# Patient Record
Sex: Female | Born: 1935
Health system: Southern US, Community
[De-identification: ages and names within clinical notes are randomized; demographics above are authoritative.]

## PROBLEM LIST (undated history)

## (undated) DIAGNOSIS — Z85828 Personal history of other malignant neoplasm of skin: Secondary | ICD-10-CM

## (undated) DIAGNOSIS — Z85820 Personal history of malignant melanoma of skin: Secondary | ICD-10-CM

## (undated) DIAGNOSIS — H3530 Unspecified macular degeneration: Secondary | ICD-10-CM

## (undated) DIAGNOSIS — K449 Diaphragmatic hernia without obstruction or gangrene: Secondary | ICD-10-CM

## (undated) DIAGNOSIS — L309 Dermatitis, unspecified: Secondary | ICD-10-CM

## (undated) DIAGNOSIS — E785 Hyperlipidemia, unspecified: Secondary | ICD-10-CM

## (undated) HISTORY — DX: Personal history of other malignant neoplasm of skin: Z85.828

## (undated) HISTORY — DX: Dermatitis, unspecified: L30.9

## (undated) HISTORY — DX: Unspecified macular degeneration: H35.30

## (undated) HISTORY — DX: Personal history of malignant melanoma of skin: Z85.820

---

## 1962-09-26 HISTORY — PX: BREAST CYST EXCISION: SHX579

## 2015-03-27 HISTORY — PX: CATARACT EXTRACTION: SUR2

## 2015-08-06 DIAGNOSIS — C4361 Malignant melanoma of right upper limb, including shoulder: Secondary | ICD-10-CM | POA: Insufficient documentation

## 2015-08-06 DIAGNOSIS — R5382 Chronic fatigue, unspecified: Secondary | ICD-10-CM | POA: Insufficient documentation

## 2015-08-06 DIAGNOSIS — K219 Gastro-esophageal reflux disease without esophagitis: Secondary | ICD-10-CM | POA: Insufficient documentation

## 2015-08-06 DIAGNOSIS — M7522 Bicipital tendinitis, left shoulder: Secondary | ICD-10-CM

## 2015-08-06 DIAGNOSIS — S46211A Strain of muscle, fascia and tendon of other parts of biceps, right arm, initial encounter: Secondary | ICD-10-CM | POA: Insufficient documentation

## 2015-08-06 DIAGNOSIS — M7521 Bicipital tendinitis, right shoulder: Secondary | ICD-10-CM | POA: Insufficient documentation

## 2015-08-06 DIAGNOSIS — K449 Diaphragmatic hernia without obstruction or gangrene: Secondary | ICD-10-CM | POA: Insufficient documentation

## 2015-08-06 DIAGNOSIS — E782 Mixed hyperlipidemia: Secondary | ICD-10-CM | POA: Insufficient documentation

## 2015-08-06 DIAGNOSIS — H409 Unspecified glaucoma: Secondary | ICD-10-CM | POA: Insufficient documentation

## 2016-06-26 HISTORY — PX: OTHER SURGICAL HISTORY: SHX169

## 2016-06-26 HISTORY — PX: MOHS SURGERY: SHX181

## 2016-11-14 LAB — BASIC METABOLIC PANEL
BUN: 14 (ref 4–21)
CREATININE: 0.8 (ref 0.5–1.1)
GLUCOSE: 93
POTASSIUM: 4.6 (ref 3.4–5.3)
SODIUM: 144 (ref 137–147)

## 2016-11-14 LAB — HEPATIC FUNCTION PANEL
ALT: 8 (ref 7–35)
AST: 19 (ref 13–35)
Alkaline Phosphatase: 63 (ref 25–125)
BILIRUBIN, TOTAL: 1

## 2016-11-14 LAB — LIPID PANEL
Cholesterol: 166 (ref 0–200)
HDL: 43 (ref 35–70)
LDL CALC: 100
Triglycerides: 198 — AB (ref 40–160)

## 2016-11-14 LAB — HEMOGLOBIN A1C: HEMOGLOBIN A1C: 5.8

## 2017-07-12 DIAGNOSIS — Z85820 Personal history of malignant melanoma of skin: Secondary | ICD-10-CM | POA: Diagnosis not present

## 2017-07-12 DIAGNOSIS — Z08 Encounter for follow-up examination after completed treatment for malignant neoplasm: Secondary | ICD-10-CM | POA: Diagnosis not present

## 2017-07-12 DIAGNOSIS — L821 Other seborrheic keratosis: Secondary | ICD-10-CM | POA: Diagnosis not present

## 2017-07-17 ENCOUNTER — Encounter: Payer: Self-pay | Admitting: Family Medicine

## 2017-07-17 ENCOUNTER — Ambulatory Visit (INDEPENDENT_AMBULATORY_CARE_PROVIDER_SITE_OTHER): Payer: Medicare Other

## 2017-07-17 ENCOUNTER — Ambulatory Visit (INDEPENDENT_AMBULATORY_CARE_PROVIDER_SITE_OTHER): Payer: Medicare Other | Admitting: Family Medicine

## 2017-07-17 VITALS — BP 129/68 | HR 88 | Ht 63.0 in | Wt 183.0 lb

## 2017-07-17 DIAGNOSIS — M545 Low back pain, unspecified: Secondary | ICD-10-CM

## 2017-07-17 DIAGNOSIS — H3530 Unspecified macular degeneration: Secondary | ICD-10-CM

## 2017-07-17 DIAGNOSIS — M25512 Pain in left shoulder: Secondary | ICD-10-CM | POA: Diagnosis not present

## 2017-07-17 DIAGNOSIS — R7301 Impaired fasting glucose: Secondary | ICD-10-CM

## 2017-07-17 DIAGNOSIS — N3281 Overactive bladder: Secondary | ICD-10-CM

## 2017-07-17 DIAGNOSIS — M461 Sacroiliitis, not elsewhere classified: Secondary | ICD-10-CM | POA: Diagnosis not present

## 2017-07-17 DIAGNOSIS — E782 Mixed hyperlipidemia: Secondary | ICD-10-CM | POA: Diagnosis not present

## 2017-07-17 DIAGNOSIS — M19012 Primary osteoarthritis, left shoulder: Secondary | ICD-10-CM | POA: Diagnosis not present

## 2017-07-17 MED ORDER — SUCRALFATE 1 G PO TABS: 1.0000 g | ORAL_TABLET | Freq: Two times a day (BID) | ORAL | 1 refills | Status: DC

## 2017-07-17 MED ORDER — MIRABEGRON ER 25 MG PO TB24: 25.0000 mg | ORAL_TABLET | Freq: Every day | ORAL | 3 refills | Status: DC

## 2017-07-17 NOTE — Progress Notes (Signed)
Subjective:    Patient ID: Anna [name], female    DOB: Jun 15, 1936, 81 y.o.   MRN: [id number]  HPI   81 year old female is here today to establish care. She is related to the [place]. She is Anna [name]'s sister.  His lives in the area for about 2 years.  She does complain of left upper arm pain that has been going on for approximately a month may be a little bit longer.  She denies any specific injury or trauma.  She is previously had problems with her right shoulder but this is a little bit different.  Most of her pain is over that upper arm area.  She has been using emu cream, and icy hot.  She states this does help slightly.  She has not been taking any oral medication or any other therapies or treatments.  She has pain particularly when she reaches backwards.  She also complains of low back pain that is bilateral and radiates into the buttock area.  She says it does limit her walking particularly after about 30 minutes she has to sit down rest which does provide some relief.  She denies again any old injuries or trauma or known fractures.  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  She denies any radiation past the buttock area into the legs.  Hyperlipidemia-she wanted to know more about Zetia.  She says that her sister takes it instead of a statin and wondered if that was a better medication than what she was currently taking.  She has been on her statin for several years and is done well without any side effects.  Also recently stopped her aspirin.  Overactive bladder-several years ago she tried an overactive bladder medication she says it was while she was still living in [place].  She does not member the specific name but says that it did not seem to work well.  She gets some frequency and occasionally some urgency but rarely incontinence.  She is able to take ibuprofen.  Anna [name] is requesting refills on her sucralfate today  Review of Systems   Constitutional: Negative for diaphoresis, fever and unexpected weight change.  HENT: Negative for hearing loss, postnasal drip, sneezing and tinnitus.   Eyes: Positive for visual disturbance.  Respiratory: Positive for cough. Negative for wheezing.   Cardiovascular: Negative for chest pain and palpitations.  Genitourinary: Negative for vaginal bleeding and vaginal discharge.  Musculoskeletal: Positive for myalgias. Negative for arthralgias.  Neurological: Negative for headaches.  Hematological: Negative for adenopathy. Bruises/bleeds easily.  Psychiatric/Behavioral: The patient is nervous/anxious.      BP 129/68   Pulse 88   Ht 5\' 3"  (1.6 m)   Wt 183 lb (83 kg)   SpO2 93%   BMI 32.42 kg/m     Allergies  Allergen Reactions  . Aleve [Naproxen Sodium]     Causes elevation in cholesterol   . Azithromycin Rash    Past Medical History:  Diagnosis Date  . Macular degeneration     Past Surgical History:  Procedure Laterality Date  . BREAST CYST EXCISION Right 09/1962  . CATARACT EXTRACTION  03/2015  . melonoma Right 06/2016   arm  . MOHS SURGERY Left 06/2016   nose    Social History   Social History  . Marital status: Single    Spouse name: N/A  . Number of children: N/A  . Years of education: 12+   Occupational History  . 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   Social History Main Topics  . Smoking status: Never Smoker  . Smokeless tobacco: Never Used  . Alcohol use 0.6 oz/week    1 Glasses of wine per week  . Drug use: No  . Sexual activity: No   Other Topics Concern  . Not on file   Social History Narrative   She has a Scientist, water quality in Film/video editor.  He lives alone in a retirement community.  Her sister is Pharmacist, community    Family History  Problem Relation Age of Onset  . Hyperlipidemia Sister   . Macular degeneration Sister   . Cancer - Prostate Brother   . Atrial fibrillation Maternal Aunt      Outpatient Encounter Prescriptions as of 07/17/2017  Medication Sig  . ALPRAZolam (XANAX) 0.25 MG tablet Take 0.25 mg by mouth at bedtime as needed for sleep.  . calcium citrate-vitamin D (CITRACAL+D) 315-200 MG-UNIT tablet Take 1 tablet by mouth 2 (two) times daily.  . Flaxseed Oil OIL 1,000 mg.  . latanoprost (XALATAN) 0.005 % ophthalmic solution Place 1 drop into both eyes daily.  Marland Kitchen lovastatin (MEVACOR) 40 MG tablet Take 40 mg by mouth.  . Lysine 500 MG CAPS Take 1 capsule by mouth daily.  . Multiple Vitamins-Minerals (PRESERVISION AREDS 2) CAPS Take by mouth.  Marland Kitchen omeprazole (PRILOSEC) 40 MG capsule Take 40 mg by mouth.  . sucralfate (CARAFATE) 1 g tablet Take 1 tablet (1 g total) by mouth 2 (two) times daily.  . timolol (BETIMOL) 0.5 % ophthalmic solution Apply 1 drop to eye 2 (two) times daily.  . TURMERIC PO Take 1,000 mg by mouth.  Marland Kitchen UNABLE TO FIND Right eye injection every 5-6 weeks for Macular Degeneration  . vitamin B-12 (CYANOCOBALAMIN) 1000 MCG tablet Take 1,000 mcg by mouth daily.   . vitamin E 400 UNIT capsule Take 1 capsule by mouth daily.  . [DISCONTINUED] lovastatin (MEVACOR) 10 MG tablet Take 10 mg by mouth at bedtime.  . [DISCONTINUED] sucralfate (CARAFATE) 1 g tablet Take 1 g by mouth 4 (four) times daily.  . mirabegron ER (MYRBETRIQ) 25 MG TB24 tablet Take 1 tablet (25 mg total) by mouth daily.  . [DISCONTINUED] aspirin 81 MG tablet Take 81 mg by mouth daily.  . [DISCONTINUED] omeprazole (PRILOSEC) 10 MG capsule Take 10 mg by mouth daily.   No facility-administered encounter medications on file as of 07/17/2017.          Objective:   Physical Exam  Constitutional: She is oriented to person, place, and time. She appears well-developed and well-nourished.  HENT:  Head: Normocephalic and atraumatic.  Right Ear: External ear normal.  Left Ear: External ear normal.  Nose: Nose normal.  Eyes: Conjunctivae are normal.  Cardiovascular: Normal rate, regular  rhythm and normal heart sounds.   Pulmonary/Chest: Effort normal and breath sounds normal.  Musculoskeletal:  Neck with normal flexion, extension, rotation right and left and side bending.  Left shoulder with fairly normal range of motion.  Nontender over the shoulder joint itself and nontender over the upper arm.  Positive empty can test with significant discomfort and pain on the left side.  Strength with liftoff test is actually normal.  Strength is 5 out of 5 with internal and external rotation.  Full range of motion.  Nontender over the lumbar spine itself but she is tender over both SI joints.  Normal flexion-extension of the lumbar spine.  Negative straight  leg raise bilaterally.  Hip, knee, ankle strength is 5 out of 5 with patellar reflexes 1+ bilaterally.  Neurological: She is alert and oriented to person, place, and time.  Skin: Skin is warm and dry.  Psychiatric: She has a normal mood and affect. Her behavior is normal.          Assessment & Plan:  GERD- RF sucralfate.    Hyperlipidemia- due to recheck lipids.  Just that a statin has much more data in reduction of risk for heart attack and stroke than Zetia.  Recommend that she stay on her current regimen.  IFG - due for A1C last tone was 6.0. Will monitor this months.  Left shoulder pain-as her pain is been going on for little over a month at this point time will go ahead and get x-ray for further evaluation.  They likely will show some arthritis.  I think that she has some rotator cuff injury at this point and given exercises and stretches to do on her own at home as well as recommendation for ibuprofen 6 oh milligrams 3 times daily times 5 days and then as needed.  Again if she is not improving and like to get her in with 1 of our sports med doctor for further evaluation.  Low back pain -she is actually very tender over the SI joints bilaterally.  We will start with some home stretches and exercises at first.  And since she is  able to take Aleve we will start with 600 mg 3 times a day for 5 days and then wean and taper and take as needed.  If she is not improving at that point then recommend x-rays for further evaluation.  Overactive bladder-recommend a trial of Myrbetriq.  If this is not relieving her symptoms and I think she needs to see urology for further consultation.  Degeneration/glaucoma-follows with her eye doctor regularly.

## 2017-07-17 NOTE — Patient Instructions (Addendum)
Recommend 60 mg of ibuprofen 3 x a day for the next 5 days. After that okay to just take as need.  If your shoulder and back are not improving in about 3 weeks then I would like to get you in with one of our sports med docs here in our office.  They take care of musculoskeletal problems.

## 2017-07-20 ENCOUNTER — Encounter: Payer: Self-pay | Admitting: Family Medicine

## 2017-07-20 DIAGNOSIS — H353211 Exudative age-related macular degeneration, right eye, with active choroidal neovascularization: Secondary | ICD-10-CM | POA: Diagnosis not present

## 2017-07-20 DIAGNOSIS — Z85820 Personal history of malignant melanoma of skin: Secondary | ICD-10-CM

## 2017-07-20 DIAGNOSIS — Z85828 Personal history of other malignant neoplasm of skin: Secondary | ICD-10-CM

## 2017-07-20 HISTORY — DX: Personal history of other malignant neoplasm of skin: Z85.828

## 2017-07-20 HISTORY — DX: Personal history of malignant melanoma of skin: Z85.820

## 2017-07-24 DIAGNOSIS — E782 Mixed hyperlipidemia: Secondary | ICD-10-CM | POA: Diagnosis not present

## 2017-07-24 DIAGNOSIS — R7301 Impaired fasting glucose: Secondary | ICD-10-CM | POA: Diagnosis not present

## 2017-07-25 LAB — COMPLETE METABOLIC PANEL WITH GFR
AG Ratio: 2 (calc) (ref 1.0–2.5)
ALBUMIN MSPROF: 4.2 g/dL (ref 3.6–5.1)
ALKALINE PHOSPHATASE (APISO): 60 U/L (ref 33–130)
ALT: 7 U/L (ref 6–29)
AST: 20 U/L (ref 10–35)
BILIRUBIN TOTAL: 0.9 mg/dL (ref 0.2–1.2)
BUN: 10 mg/dL (ref 7–25)
CHLORIDE: 104 mmol/L (ref 98–110)
CO2: 29 mmol/L (ref 20–32)
Calcium: 9 mg/dL (ref 8.6–10.4)
Creat: 0.85 mg/dL (ref 0.60–0.93)
GFR, Est African American: 76 mL/min/{1.73_m2} (ref >=60)
GFR, Est Non African American: 65 mL/min/{1.73_m2} (ref >=60)
Globulin: 2.1 g/dL (calc) (ref 1.9–3.7)
Glucose, Bld: 95 mg/dL (ref 65–99)
Potassium: 4.3 mmol/L (ref 3.5–5.3)
Sodium: 141 mmol/L (ref 135–146)
Total Protein: 6.3 g/dL (ref 6.1–8.1)

## 2017-07-25 LAB — LIPID PANEL W/REFLEX DIRECT LDL
CHOLESTEROL: 176 mg/dL (ref <200)
HDL: 45 mg/dL — ABNORMAL LOW (ref >50)
LDL Cholesterol (Calc): 96 mg/dL (calc)
NON-HDL CHOLESTEROL (CALC): 131 mg/dL — AB (ref <130)
Total CHOL/HDL Ratio: 3.9 (calc) (ref <5.0)
Triglycerides: 234 mg/dL — ABNORMAL HIGH (ref <150)

## 2017-07-25 LAB — HEMOGLOBIN A1C
Hgb A1c MFr Bld: 5.8 % of total Hgb — ABNORMAL HIGH (ref <5.7)
Mean Plasma Glucose: 120 (calc)
eAG (mmol/L): 6.6 (calc)

## 2017-08-07 DIAGNOSIS — H401131 Primary open-angle glaucoma, bilateral, mild stage: Secondary | ICD-10-CM | POA: Diagnosis not present

## 2017-08-07 DIAGNOSIS — H353122 Nonexudative age-related macular degeneration, left eye, intermediate dry stage: Secondary | ICD-10-CM | POA: Diagnosis not present

## 2017-08-07 DIAGNOSIS — H26493 Other secondary cataract, bilateral: Secondary | ICD-10-CM | POA: Diagnosis not present

## 2017-08-07 DIAGNOSIS — H353211 Exudative age-related macular degeneration, right eye, with active choroidal neovascularization: Secondary | ICD-10-CM | POA: Diagnosis not present

## 2017-08-07 DIAGNOSIS — D3132 Benign neoplasm of left choroid: Secondary | ICD-10-CM | POA: Diagnosis not present

## 2017-08-07 DIAGNOSIS — H527 Unspecified disorder of refraction: Secondary | ICD-10-CM | POA: Diagnosis not present

## 2017-08-10 ENCOUNTER — Telehealth: Payer: Self-pay

## 2017-08-10 NOTE — Telephone Encounter (Signed)
Patient would like a refill on Omeprazole. Historical provider.

## 2017-08-10 NOTE — Telephone Encounter (Signed)
OK to fill

## 2017-08-11 MED ORDER — OMEPRAZOLE 40 MG PO CPDR: 40.0000 mg | DELAYED_RELEASE_CAPSULE | Freq: Every day | ORAL | 3 refills | Status: DC

## 2017-08-11 NOTE — Telephone Encounter (Signed)
Rx sent Pt advised 

## 2017-09-08 ENCOUNTER — Encounter: Payer: Self-pay | Admitting: Family Medicine

## 2017-10-04 ENCOUNTER — Emergency Department
Admission: EM | Admit: 2017-10-04 | Discharge: 2017-10-04 | Disposition: A | Discharge status: Home / Self Care | Payer: Medicare Other | Source: Home / Self Care | Attending: Family Medicine | Admitting: Family Medicine

## 2017-10-04 ENCOUNTER — Other Ambulatory Visit: Payer: Self-pay

## 2017-10-04 DIAGNOSIS — J069 Acute upper respiratory infection, unspecified: Secondary | ICD-10-CM | POA: Diagnosis not present

## 2017-10-04 DIAGNOSIS — B9789 Other viral agents as the cause of diseases classified elsewhere: Secondary | ICD-10-CM

## 2017-10-04 HISTORY — DX: Diaphragmatic hernia without obstruction or gangrene: K44.9

## 2017-10-04 HISTORY — DX: Hyperlipidemia, unspecified: E78.5

## 2017-10-04 MED ORDER — ALBUTEROL SULFATE HFA 108 (90 BASE) MCG/ACT IN AERS: 1.0000 | INHALATION_SPRAY | Freq: Four times a day (QID) | RESPIRATORY_TRACT | 0 refills | Status: DC | PRN

## 2017-10-04 MED ORDER — IPRATROPIUM BROMIDE 0.06 % NA SOLN: 2.0000 | Freq: Four times a day (QID) | NASAL | 1 refills | Status: DC

## 2017-10-04 MED ORDER — AMOXICILLIN 500 MG PO CAPS
500.0000 mg | ORAL_CAPSULE | Freq: Three times a day (TID) | ORAL | 0 refills | Status: DC
Start: 2017-10-04 — End: 2017-11-30

## 2017-10-04 MED ORDER — BENZONATATE 100 MG PO CAPS: 100.0000 mg | ORAL_CAPSULE | Freq: Three times a day (TID) | ORAL | 0 refills | Status: DC

## 2017-10-04 NOTE — ED Triage Notes (Signed)
Pt c/o cough with yellow drainage, sneezing with clear nasal drainage, chest congestion with wheezing, sore throat, onset x 4 days.  Denies ear pain and fever.  Has Mucinex with last dose at 11pm last night.

## 2017-10-04 NOTE — Discharge Instructions (Signed)
°  Your symptoms are likely due to a virus such as the common cold, however, if you developing worsening chest congestion with shortness of breath, persistent fever (>100.4*F) for 3 days, or symptoms not improving in 4-5 days, you may fill the antibiotic (amoxicillin).  If you do fill the antibiotic,  please take antibiotics as prescribed and be sure to complete entire course even if you start to feel better to ensure infection does not come back.

## 2017-10-04 NOTE — ED Provider Notes (Signed)
[facility]    CSN: [id number] Arrival date & time: 10/04/17  1145     History   Chief Complaint Chief Complaint  Patient presents with  . URI    HPI Anna [name] is a 82 y.o. female.   HPI  Anna [name] is a 82 y.o. female presenting to UC with c/o cough with yellow sputum, chest congestion, wheeze, sneezing with clear nasal discharge, and sore throat for about 4 days.  She has tried Mucinex with last dose being 11pm last night. Denies fever, chills, n/v/d. No hx of asthma but she has had bronchitis in the past. Pt concerned she has bronchitis again. Denies sick contacts or recent travel.   Past Medical History:  Diagnosis Date  . Hiatal hernia   . History of basal cell carcinoma 07/20/2017   On nose.   Marland Kitchen History of melanoma 07/20/2017   Right upper arm   . Hyperlipidemia   . Macular degeneration     Patient Active Problem List   Diagnosis Date Noted  . History of melanoma 07/20/2017  . History of basal cell carcinoma 07/20/2017  . IFG (impaired fasting glucose) 07/17/2017  . Macular degeneration   . Chronic fatigue 08/06/2015  . Gastroesophageal reflux disease without esophagitis 08/06/2015  . Glaucoma 08/06/2015  . Hiatal hernia 08/06/2015  . Melanoma of right upper arm ([place]) 08/06/2015  . Mixed hyperlipidemia 08/06/2015  . Rupture of right biceps tendon 08/06/2015  . Biceps tendonitis of both shoulders 08/06/2015    Past Surgical History:  Procedure Laterality Date  . BREAST CYST EXCISION Right 09/1962  . CATARACT EXTRACTION  03/2015  . melonoma Right 06/2016   arm  . MOHS SURGERY Left 06/2016   nose    OB History    No data available       Home Medications    Prior to Admission medications   Medication Sig Start Date End Date Taking? Authorizing Provider  albuterol (PROVENTIL HFA;VENTOLIN HFA) 108 (90 Base) MCG/ACT inhaler Inhale 1-2 puffs into the lungs every 6 (six) hours as needed for wheezing or shortness of breath. 10/04/17    Noe Gens, PA-C  ALPRAZolam Duanne Moron) 0.25 MG tablet Take 0.25 mg by mouth at bedtime as needed for sleep. 11/14/16   [provider]  amoxicillin (AMOXIL) 500 MG capsule Take 1 capsule (500 mg total) by mouth 3 (three) times daily. 10/04/17   Noe Gens, PA-C  benzonatate (TESSALON) 100 MG capsule Take 1-2 capsules (100-200 mg total) by mouth every 8 (eight) hours. 10/04/17   Noe Gens, PA-C  calcium citrate-vitamin D (CITRACAL+D) 315-200 MG-UNIT tablet Take 1 tablet by mouth 2 (two) times daily.    [provider]  Flaxseed Oil OIL 1,000 mg.    [provider]  ipratropium (ATROVENT) 0.06 % nasal spray Place 2 sprays into both nostrils 4 (four) times daily. 10/04/17   Noe Gens, PA-C  latanoprost (XALATAN) 0.005 % ophthalmic solution Place 1 drop into both eyes daily. 05/02/16   [provider]  lovastatin (MEVACOR) 40 MG tablet Take 40 mg by mouth. 11/14/16   [provider]  Lysine 500 MG CAPS Take 1 capsule by mouth daily.    [provider]  mirabegron ER (MYRBETRIQ) 25 MG TB24 tablet Take 1 tablet (25 mg total) by mouth daily. 07/17/17   Hali Marry, MD  Multiple Vitamins-Minerals (PRESERVISION AREDS 2) CAPS Take by mouth.    [provider]  omeprazole (PRILOSEC) 40 MG  capsule Take 1 capsule (40 mg total) daily by mouth. 08/11/17   Hali Marry, MD  sucralfate (CARAFATE) 1 g tablet Take 1 tablet (1 g total) by mouth 2 (two) times daily. 07/17/17   Hali Marry, MD  timolol (BETIMOL) 0.5 % ophthalmic solution Apply 1 drop to eye 2 (two) times daily.    [provider]  TURMERIC PO Take 1,000 mg by mouth.    [provider]  UNABLE TO FIND Right eye injection every 5-6 weeks for Macular Degeneration    [provider]  vitamin B-12 (CYANOCOBALAMIN) 1000 MCG tablet Take 1,000 mcg by mouth daily.     [provider]  vitamin E 400 UNIT capsule Take 1 capsule by  mouth daily.    [provider]    Family History Family History  Problem Relation Age of Onset  . Hyperlipidemia Sister   . Macular degeneration Sister   . Cancer - Prostate Brother   . Atrial fibrillation Maternal Aunt   . Cancer Mother        pancreatic  . Cancer Father        liver    Social History Social History   Tobacco Use  . Smoking status: Never Smoker  . Smokeless tobacco: Never Used  Substance Use Topics  . Alcohol use: Yes    Alcohol/week: 0.6 oz    Types: 1 Glasses of wine per week  . Drug use: No     Allergies   Aleve [naproxen sodium] and Azithromycin   Review of Systems Review of Systems  Constitutional: Negative for chills and fever.  HENT: Positive for congestion, postnasal drip, rhinorrhea, sinus pressure, sneezing and sore throat. Negative for ear pain, sinus pain, trouble swallowing and voice change.   Respiratory: Positive for cough and wheezing. Negative for shortness of breath.   Cardiovascular: Negative for chest pain and palpitations.  Gastrointestinal: Negative for abdominal pain, diarrhea, nausea and vomiting.  Musculoskeletal: Negative for arthralgias, back pain and myalgias.  Skin: Negative for rash.  Neurological: Negative for dizziness, syncope, weakness, light-headedness and headaches.     Physical Exam Triage Vital Signs ED Triage Vitals [10/04/17 1204]  Enc Vitals Group     BP 136/78     Pulse Rate 93     Resp      Temp 98.2 F (36.8 C)     Temp Source Oral     SpO2 94 %     Weight 185 lb 8 oz (84.1 kg)     Height 5\' 3"  (1.6 m)     Head Circumference      Peak Flow      Pain Score      Pain Loc      Pain Edu?      Excl. in [place]?    No data found.  Updated Vital Signs BP 136/78 (BP Location: Right Arm)   Pulse 93   Temp 98.2 F (36.8 C) (Oral)   Ht 5\' 3"  (1.6 m)   Wt 185 lb 8 oz (84.1 kg)   SpO2 94%   BMI 32.86 kg/m   Visual Acuity Right Eye Distance:   Left Eye Distance:   Bilateral  Distance:    Right Eye Near:   Left Eye Near:    Bilateral Near:     Physical Exam  Constitutional: She is oriented to person, place, and time. She appears well-developed and well-nourished. No distress.  HENT:  Head: Normocephalic and atraumatic.  Right Ear:  Tympanic membrane normal.  Left Ear: Tympanic membrane normal.  Nose: Mucosal edema present. Right sinus exhibits no maxillary sinus tenderness and no frontal sinus tenderness. Left sinus exhibits no maxillary sinus tenderness and no frontal sinus tenderness.  Mouth/Throat: Uvula is midline, oropharynx is clear and moist and mucous membranes are normal.  Eyes: EOM are normal.  Neck: Normal range of motion. Neck supple.  Cardiovascular: Normal rate and regular rhythm.  Pulmonary/Chest: Effort normal and breath sounds normal. No stridor. No respiratory distress. She has no wheezes. She has no rales.  Musculoskeletal: Normal range of motion.  Neurological: She is alert and oriented to person, place, and time.  Skin: Skin is warm and dry. She is not diaphoretic.  Psychiatric: She has a normal mood and affect. Her behavior is normal.  Nursing note and vitals reviewed.    UC Treatments / Results  Labs (all labs ordered are listed, but only abnormal results are displayed) Labs Reviewed - No data to display  EKG  EKG Interpretation None       Radiology No results found.  Procedures Procedures (including critical care time)  Medications Ordered in UC Medications - No data to display   Initial Impression / Assessment and Plan / UC Course  I have reviewed the triage vital signs and the nursing notes.  Pertinent labs & imaging results that were available during my care of the patient were reviewed by me and considered in my medical decision making (see chart for details).     Exam c/w viral illness Encouraged pt try symptomatic treatment at this time. Prescription to hold with expiration date for amoxicillin. Pt to  fill if persistent fever develops, worsening chest tightness, or not improving in 1 week.    Final Clinical Impressions(s) / UC Diagnoses   Final diagnoses:  Viral URI with cough    ED Discharge Orders        Ordered    ipratropium (ATROVENT) 0.06 % nasal spray  4 times daily     10/04/17 1220    benzonatate (TESSALON) 100 MG capsule  Every 8 hours     10/04/17 1220    albuterol (PROVENTIL HFA;VENTOLIN HFA) 108 (90 Base) MCG/ACT inhaler  Every 6 hours PRN     10/04/17 1220    amoxicillin (AMOXIL) 500 MG capsule  3 times daily    Comments:  Void after 10/20/17   10/04/17 1220       Controlled Substance Prescriptions  Controlled Substance Registry consulted? Not Applicable   Tyrell Antonio 10/04/17 1324

## 2017-10-19 DIAGNOSIS — H353211 Exudative age-related macular degeneration, right eye, with active choroidal neovascularization: Secondary | ICD-10-CM | POA: Diagnosis not present

## 2017-11-30 ENCOUNTER — Ambulatory Visit (INDEPENDENT_AMBULATORY_CARE_PROVIDER_SITE_OTHER): Payer: Medicare Other | Admitting: Family Medicine

## 2017-11-30 ENCOUNTER — Encounter: Payer: Self-pay | Admitting: Family Medicine

## 2017-11-30 VITALS — BP 136/74 | HR 74 | Ht 63.0 in | Wt 184.0 lb

## 2017-11-30 DIAGNOSIS — K429 Umbilical hernia without obstruction or gangrene: Secondary | ICD-10-CM | POA: Diagnosis not present

## 2017-11-30 NOTE — Patient Instructions (Addendum)

## 2017-11-30 NOTE — Progress Notes (Signed)
Subjective:    Patient ID: Anna [name], female    DOB: 19-Oct-1935, 82 y.o.   MRN: [id number]  HPI pt reports that she noticed that her belly button looked very different over the last 41mos ago. she said that it is not painful. she believes that it is from all of the lifting that she has been doing. she stated that she has moved 2x over the past 3 years.  She thinks that the hernia and says she gets a little nervous if she has a strain with bowel movements.  She does have a history of hiatal hernia.   Review of Systems  BP 136/74   Pulse 74   Ht 5\' 3"  (1.6 m)   Wt 184 lb (83.5 kg)   SpO2 98%   BMI 32.59 kg/m     Allergies  Allergen Reactions  . Aleve [Naproxen Sodium]     Causes elevation in cholesterol   . Azithromycin Rash    Past Medical History:  Diagnosis Date  . Hiatal hernia   . History of basal cell carcinoma 07/20/2017   On nose.   Marland Kitchen History of melanoma 07/20/2017   Right upper arm   . Hyperlipidemia   . Macular degeneration     Past Surgical History:  Procedure Laterality Date  . BREAST CYST EXCISION Right 09/1962  . CATARACT EXTRACTION  03/2015  . melonoma Right 06/2016   arm  . MOHS SURGERY Left 06/2016   nose    Social History   Socioeconomic History  . Marital status: Single    Spouse name: Not on file  . Number of children: Not on file  . Years of education: 12+  . Highest education level: Not on file  Social Needs  . Financial resource strain: Not on file  . Food insecurity - worry: Not on file  . Food insecurity - inability: Not on file  . Transportation needs - medical: Not on file  . Transportation needs - non-medical: Not on file  Occupational History  . 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  Tobacco Use  . Smoking status: Never Smoker  . Smokeless tobacco: Never Used  Substance and Sexual Activity  . Alcohol use: Yes    Alcohol/week: 0.6 oz    Types: 1 Glasses  of wine per week  . Drug use: No  . Sexual activity: No  Other Topics Concern  . Not on file  Social History Narrative   She has a Scientist, water quality in Film/video editor.  He lives alone in a retirement community.  Her sister is Pharmacist, community    Family History  Problem Relation Age of Onset  . Hyperlipidemia Sister   . Macular degeneration Sister   . Cancer - Prostate Brother   . Atrial fibrillation Maternal Aunt   . Cancer Mother        pancreatic  . Cancer Father        liver    Outpatient Encounter Medications as of 11/30/2017  Medication Sig  . ALPRAZolam (XANAX) 0.25 MG tablet Take 0.25 mg by mouth at bedtime as needed for sleep.  . calcium citrate-vitamin D (CITRACAL+D) 315-200 MG-UNIT tablet Take 1 tablet by mouth 2 (two) times daily.  . Flaxseed Oil OIL 1,000 mg.  . latanoprost (XALATAN) 0.005 % ophthalmic solution Place 1 drop into both eyes daily.  Marland Kitchen lovastatin (MEVACOR) 40 MG tablet Take 40 mg by mouth.  . Lysine  500 MG CAPS Take 1 capsule by mouth daily.  . mirabegron ER (MYRBETRIQ) 25 MG TB24 tablet Take 1 tablet (25 mg total) by mouth daily.  . Multiple Vitamins-Minerals (PRESERVISION AREDS 2) CAPS Take by mouth.  Marland Kitchen omeprazole (PRILOSEC) 40 MG capsule Take 1 capsule (40 mg total) daily by mouth.  . sucralfate (CARAFATE) 1 g tablet Take 1 tablet (1 g total) by mouth 2 (two) times daily.  . timolol (BETIMOL) 0.5 % ophthalmic solution Apply 1 drop to eye 2 (two) times daily.  . TURMERIC PO Take 1,000 mg by mouth.  Marland Kitchen UNABLE TO FIND Right eye injection every 5-6 weeks for Macular Degeneration  . vitamin B-12 (CYANOCOBALAMIN) 1000 MCG tablet Take 1,000 mcg by mouth daily.   . vitamin E 400 UNIT capsule Take 1 capsule by mouth daily.  . [DISCONTINUED] albuterol (PROVENTIL HFA;VENTOLIN HFA) 108 (90 Base) MCG/ACT inhaler Inhale 1-2 puffs into the lungs every 6 (six) hours as needed for wheezing or shortness of breath.  . [DISCONTINUED] amoxicillin (AMOXIL) 500 MG capsule Take 1  capsule (500 mg total) by mouth 3 (three) times daily.  . [DISCONTINUED] benzonatate (TESSALON) 100 MG capsule Take 1-2 capsules (100-200 mg total) by mouth every 8 (eight) hours.  . [DISCONTINUED] ipratropium (ATROVENT) 0.06 % nasal spray Place 2 sprays into both nostrils 4 (four) times daily.   No facility-administered encounter medications on file as of 11/30/2017.          Objective:   Physical Exam  Constitutional: She is oriented to person, place, and time. She appears well-developed and well-nourished.  HENT:  Head: Normocephalic and atraumatic.  Eyes: Conjunctivae and EOM are normal.  Cardiovascular: Normal rate.  Pulmonary/Chest: Effort normal.  Abdominal: Soft. She exhibits no distension. There is no tenderness. There is no rebound.  She definitely has a umbilical hernia with a defect in the wall.  It is very soft and nontender on exam.  No surrounding erythema.  I did not palpate any defects around the umbilicus.  Neurological: She is alert and oriented to person, place, and time.  Skin: Skin is dry. No pallor.  Psychiatric: She has a normal mood and affect. Her behavior is normal.  Vitals reviewed.         Assessment & Plan:  Umbilical hernia- -discussed diagnosis.  Discussed warning symptoms and signs for when to go to the emergency department.  At this point I recommend consultation with surgery to discuss possible repair.  There are some people who choose to just leave it especially if is not causing any pain or problem.  But some people do opt to have it repaired she.  She is extremely healthy so she opts to have it repaired she will likely do well with surgery.  Additional handout provided.

## 2017-12-15 DIAGNOSIS — K429 Umbilical hernia without obstruction or gangrene: Secondary | ICD-10-CM | POA: Diagnosis not present

## 2018-01-04 DIAGNOSIS — H353211 Exudative age-related macular degeneration, right eye, with active choroidal neovascularization: Secondary | ICD-10-CM | POA: Diagnosis not present

## 2018-01-08 DIAGNOSIS — L821 Other seborrheic keratosis: Secondary | ICD-10-CM | POA: Diagnosis not present

## 2018-01-08 DIAGNOSIS — L820 Inflamed seborrheic keratosis: Secondary | ICD-10-CM | POA: Diagnosis not present

## 2018-01-08 DIAGNOSIS — Z85828 Personal history of other malignant neoplasm of skin: Secondary | ICD-10-CM | POA: Diagnosis not present

## 2018-01-08 DIAGNOSIS — Z85820 Personal history of malignant melanoma of skin: Secondary | ICD-10-CM | POA: Diagnosis not present

## 2018-02-12 DIAGNOSIS — D3132 Benign neoplasm of left choroid: Secondary | ICD-10-CM | POA: Diagnosis not present

## 2018-02-12 DIAGNOSIS — H353211 Exudative age-related macular degeneration, right eye, with active choroidal neovascularization: Secondary | ICD-10-CM | POA: Diagnosis not present

## 2018-02-12 DIAGNOSIS — H26493 Other secondary cataract, bilateral: Secondary | ICD-10-CM | POA: Diagnosis not present

## 2018-02-12 DIAGNOSIS — H401131 Primary open-angle glaucoma, bilateral, mild stage: Secondary | ICD-10-CM | POA: Diagnosis not present

## 2018-02-12 DIAGNOSIS — H353122 Nonexudative age-related macular degeneration, left eye, intermediate dry stage: Secondary | ICD-10-CM | POA: Diagnosis not present

## 2018-02-27 ENCOUNTER — Other Ambulatory Visit: Payer: Self-pay | Admitting: Family Medicine

## 2018-03-07 ENCOUNTER — Other Ambulatory Visit: Payer: Self-pay | Admitting: Family Medicine

## 2018-03-08 DIAGNOSIS — H353211 Exudative age-related macular degeneration, right eye, with active choroidal neovascularization: Secondary | ICD-10-CM | POA: Diagnosis not present

## 2018-03-18 ENCOUNTER — Other Ambulatory Visit: Payer: Self-pay | Admitting: Family Medicine

## 2018-05-03 DIAGNOSIS — H353211 Exudative age-related macular degeneration, right eye, with active choroidal neovascularization: Secondary | ICD-10-CM | POA: Diagnosis not present

## 2018-06-14 ENCOUNTER — Encounter: Payer: Self-pay | Admitting: Family Medicine

## 2018-07-04 ENCOUNTER — Emergency Department
Admission: EM | Admit: 2018-07-04 | Discharge: 2018-07-04 | Disposition: A | Discharge status: Home / Self Care | Payer: Medicare Other | Source: Home / Self Care | Attending: Family Medicine | Admitting: Family Medicine

## 2018-07-04 ENCOUNTER — Encounter: Payer: Self-pay | Admitting: *Deleted

## 2018-07-04 ENCOUNTER — Emergency Department (INDEPENDENT_AMBULATORY_CARE_PROVIDER_SITE_OTHER): Payer: Medicare Other

## 2018-07-04 ENCOUNTER — Other Ambulatory Visit: Payer: Self-pay

## 2018-07-04 DIAGNOSIS — R42 Dizziness and giddiness: Secondary | ICD-10-CM | POA: Diagnosis not present

## 2018-07-04 DIAGNOSIS — H3530 Unspecified macular degeneration: Secondary | ICD-10-CM

## 2018-07-04 MED ORDER — MECLIZINE HCL 25 MG PO TABS: 25.0000 mg | ORAL_TABLET | Freq: Three times a day (TID) | ORAL | 0 refills | Status: DC | PRN

## 2018-07-04 NOTE — ED Triage Notes (Signed)
Pt c/o intermittent dizzy spells every 2 weeks for a month. She had one last night called EMS because she became flush and red. She took Dramamine while they were in route and her symptoms resolved. BP with EMS was 152/92 and remained the same when she stood up. She refused EMS because she was feeling better. She is here today for a f/u. She reports having Vertigo many years ago and had relief with Meclizine.

## 2018-07-04 NOTE — ED Provider Notes (Signed)
[facility]    CSN: [id number] Arrival date & time: 07/04/18  1113     History   Chief Complaint Chief Complaint  Patient presents with  . Dizziness    HPI Anna [name] is a 82 y.o. female.   HPI Anna [name] is a 82 y.o. female presenting to UC with c/o intermittent dizzy spells over the last 1 month but worse the last 2 weeks. Symptoms initially started with what felt like a "whack" on the Left posterior aspect of her head. Pain was 10/10 throbbing but only lasted a few seconds.  She has since had 3 episodes of dizziness. First episode lasted 2 days, second one 3 days. And one last night lasted about 2 hours but improved after taking OTC Dramamine.  She did get concerned last night because she felt flush and her face turned red.  EMS was called but determined her vitals were good, she was not orthostatic and dizziness resolved due to taking Dramamine. Pt declined transport to the hospital.  She did have Vertigo many years ago and would like to try Meclizine for current dizzy spells because it helped in the past. Denies recent illness of cough or congestion. Denies HA at this time. Denies chest pain or palpitations. No nausea, weakness or numbness.    Past Medical History:  Diagnosis Date  . Hiatal hernia   . History of basal cell carcinoma 07/20/2017   On nose.   Marland Kitchen History of melanoma 07/20/2017   Right upper arm   . Hyperlipidemia   . Macular degeneration     Patient Active Problem List   Diagnosis Date Noted  . History of melanoma 07/20/2017  . History of basal cell carcinoma 07/20/2017  . IFG (impaired fasting glucose) 07/17/2017  . Macular degeneration   . Chronic fatigue 08/06/2015  . Gastroesophageal reflux disease without esophagitis 08/06/2015  . Glaucoma 08/06/2015  . Hiatal hernia 08/06/2015  . Melanoma of right upper arm ([place]) 08/06/2015  . Mixed hyperlipidemia 08/06/2015  . Rupture of right biceps tendon 08/06/2015  . Biceps tendonitis of  both shoulders 08/06/2015    Past Surgical History:  Procedure Laterality Date  . BREAST CYST EXCISION Right 09/1962  . CATARACT EXTRACTION  03/2015  . melonoma Right 06/2016   arm  . MOHS SURGERY Left 06/2016   nose    OB History   None      Home Medications    Prior to Admission medications   Medication Sig Start Date End Date Taking? Authorizing Provider  ALPRAZolam Duanne Moron) 0.25 MG tablet Take 0.25 mg by mouth at bedtime as needed for sleep. 11/14/16   [provider]  calcium citrate-vitamin D (CITRACAL+D) 315-200 MG-UNIT tablet Take 1 tablet by mouth 2 (two) times daily.    [provider]  Flaxseed Oil OIL 1,000 mg.    [provider]  latanoprost (XALATAN) 0.005 % ophthalmic solution Place 1 drop into both eyes daily. 05/02/16   [provider]  lovastatin (MEVACOR) 40 MG tablet TAKE ONE TABLET BY MOUTH NIGHTLY 02/27/18   Hali Marry, MD  Lysine 500 MG CAPS Take 1 capsule by mouth daily.    [provider]  meclizine (ANTIVERT) 25 MG tablet Take 1 tablet (25 mg total) by mouth 3 (three) times daily as needed for dizziness. 07/04/18   Noe Gens, PA-C  Multiple Vitamins-Minerals (PRESERVISION AREDS 2) CAPS Take by mouth.    [provider]  MYRBETRIQ 25 MG TB24 tablet Take 1  tablet (25 mg total) by mouth daily. 03/07/18   Hali Marry, MD  omeprazole (PRILOSEC) 40 MG capsule Take 1 capsule (40 mg total) daily by mouth. 08/11/17   Hali Marry, MD  sucralfate (CARAFATE) 1 g tablet TAKE 1 TABLET BY MOUTH TWICE DAILY 03/19/18   Hali Marry, MD  timolol (BETIMOL) 0.5 % ophthalmic solution Apply 1 drop to eye 2 (two) times daily.    [provider]  TURMERIC PO Take 1,000 mg by mouth.    [provider]  UNABLE TO FIND Right eye injection every 5-6 weeks for Macular Degeneration    [provider]  vitamin B-12 (CYANOCOBALAMIN) 1000 MCG tablet Take 1,000 mcg by  mouth daily.     [provider]  vitamin E 400 UNIT capsule Take 1 capsule by mouth daily.    [provider]    Family History Family History  Problem Relation Age of Onset  . Hyperlipidemia Sister   . Macular degeneration Sister   . Cancer - Prostate Brother   . Atrial fibrillation Maternal Aunt   . Cancer Mother        pancreatic  . Cancer Father        liver    Social History Social History   Tobacco Use  . Smoking status: Never Smoker  . Smokeless tobacco: Never Used  Substance Use Topics  . Alcohol use: Yes    Alcohol/week: 1.0 standard drinks    Types: 1 Glasses of wine per week  . Drug use: No     Allergies   Aleve [naproxen sodium] and Azithromycin   Review of Systems Review of Systems  Constitutional: Negative for chills and fever.  HENT: Negative for congestion, ear pain, sore throat, trouble swallowing and voice change.   Respiratory: Negative for cough and shortness of breath.   Cardiovascular: Negative for chest pain and palpitations.  Gastrointestinal: Negative for abdominal pain, diarrhea, nausea and vomiting.  Musculoskeletal: Negative for arthralgias, back pain and myalgias.  Skin: Negative for rash.  Neurological: Positive for dizziness and light-headedness. Negative for syncope, facial asymmetry and speech difficulty.     Physical Exam Triage Vital Signs ED Triage Vitals  Enc Vitals Group     BP 07/04/18 1149 137/83     Pulse Rate 07/04/18 1149 73     Resp 07/04/18 1149 18     Temp 07/04/18 1149 98.2 F (36.8 C)     Temp Source 07/04/18 1149 Oral     SpO2 07/04/18 1149 96 %     Weight 07/04/18 1150 185 lb (83.9 kg)     Height 07/04/18 1150 5\' 3"  (1.6 m)     Head Circumference --      Peak Flow --      Pain Score 07/04/18 1149 0     Pain Loc --      Pain Edu? --      Excl. in [place]? --    No data found.  Updated Vital Signs BP 137/83 (BP Location: Right Arm)   Pulse 73   Temp 98.2 F (36.8 C) (Oral)   Resp  18   Ht 5\' 3"  (1.6 m)   Wt 185 lb (83.9 kg)   SpO2 96%   BMI 32.77 kg/m   Visual Acuity Right Eye Distance:   Left Eye Distance:   Bilateral Distance:    Right Eye Near:   Left Eye Near:    Bilateral Near:     Physical Exam  Constitutional: She is oriented to person, place, and time. She appears well-developed and well-nourished. No distress.  HENT:  Head: Normocephalic and atraumatic.  Right Ear: Tympanic membrane normal.  Left Ear: Tympanic membrane normal.  Nose: Nose normal.  Mouth/Throat: Uvula is midline, oropharynx is clear and moist and mucous membranes are normal.  Eyes: Pupils are equal, round, and reactive to light. Conjunctivae and EOM are normal. Right eye exhibits no discharge. Left eye exhibits no discharge.  Normal EOMs but did have to stop a few times during test because it was making dizziness worse.  Neck: Normal range of motion. Neck supple.  Cardiovascular: Normal rate and regular rhythm.  Pulmonary/Chest: Effort normal and breath sounds normal. No stridor. No respiratory distress. She has no wheezes. She has no rales.  Musculoskeletal: Normal range of motion.  Neurological: She is alert and oriented to person, place, and time. No cranial nerve deficit. Coordination normal.  Skin: Skin is warm and dry. She is not diaphoretic.  Psychiatric: She has a normal mood and affect. Her behavior is normal.  Nursing note and vitals reviewed.    UC Treatments / Results  Labs (all labs ordered are listed, but only abnormal results are displayed) Labs Reviewed - No data to display  EKG None  Radiology Ct Head Wo Contrast  Result Date: 07/04/2018 CLINICAL DATA:  Pt c/o dizziness, vertigo for the past two days, mohs surgery, cataract ext., macular degeneration, hyperlipidemia EXAM: CT HEAD WITHOUT CONTRAST TECHNIQUE: Contiguous axial images were obtained from the base of the skull through the vertex without intravenous contrast. COMPARISON:  None. FINDINGS: Brain:  No evidence of acute infarction, hemorrhage, hydrocephalus, extra-axial collection or mass lesion/mass effect. There are patchy areas of white matter hypoattenuation consistent with mild chronic microvascular ischemic change. Vascular: No hyperdense vessel or unexpected calcification. Skull: Normal. Negative for fracture or focal lesion. Sinuses/Orbits: Globes and orbits are unremarkable. Visualized sinuses and mastoid air cells are clear. Other: None. IMPRESSION: 1. No acute intracranial abnormalities. 2. Mild chronic microvascular ischemic change. Electronically Signed   By: Lajean Manes M.D.   On: 07/04/2018 13:15    Procedures Procedures (including critical care time)  Medications Ordered in UC Medications - No data to display  Initial Impression / Assessment and Plan / UC Course  I have reviewed the triage vital signs and the nursing notes.  Pertinent labs & imaging results that were available during my care of the patient were reviewed by me and considered in my medical decision making (see chart for details).    Discussed pt with Dr. Madilyn Fireman, pt's PCP, agrees pt should get a CT head scan Reviewed imaging with Dr. Madilyn Fireman and pt.  No acute findings, will tx for vertigo and have pt f/u next week with Dr. Madilyn Fireman. Discussed symptoms that warrant emergent care in the ED.   Final Clinical Impressions(s) / UC Diagnoses   Final diagnoses:  Dizziness     Discharge Instructions      Please schedule a follow up appointment with Dr. Madilyn Fireman next week for recheck of symptoms.  Call 911 or go to the hospital if symptoms worsening- worsening dizziness, passing out, headache, one sided weakness/numbness.    ED Prescriptions    Medication Sig Dispense Auth. Provider   meclizine (ANTIVERT) 25 MG tablet Take 1 tablet (25 mg total) by mouth 3 (three) times daily as needed for dizziness. 30 tablet Noe Gens, PA-C     Controlled Substance Prescriptions [place] Controlled Substance  Registry consulted? Not Applicable  Noe Gens, [place] 07/04/18 1355

## 2018-07-04 NOTE — Discharge Instructions (Signed)
°  Please schedule a follow up appointment with Dr. Madilyn Fireman next week for recheck of symptoms.  Call 911 or go to the hospital if symptoms worsening- worsening dizziness, passing out, headache, one sided weakness/numbness.

## 2018-07-09 DIAGNOSIS — Z85828 Personal history of other malignant neoplasm of skin: Secondary | ICD-10-CM | POA: Diagnosis not present

## 2018-07-09 DIAGNOSIS — Z85820 Personal history of malignant melanoma of skin: Secondary | ICD-10-CM | POA: Diagnosis not present

## 2018-07-09 DIAGNOSIS — X32XXXS Exposure to sunlight, sequela: Secondary | ICD-10-CM | POA: Diagnosis not present

## 2018-07-09 DIAGNOSIS — Z08 Encounter for follow-up examination after completed treatment for malignant neoplasm: Secondary | ICD-10-CM | POA: Diagnosis not present

## 2018-07-10 ENCOUNTER — Ambulatory Visit (INDEPENDENT_AMBULATORY_CARE_PROVIDER_SITE_OTHER): Payer: Medicare Other | Admitting: Family Medicine

## 2018-07-10 ENCOUNTER — Encounter: Payer: Self-pay | Admitting: Family Medicine

## 2018-07-10 VITALS — BP 123/69 | HR 86 | Ht 63.0 in | Wt 183.0 lb

## 2018-07-10 DIAGNOSIS — H8111 Benign paroxysmal vertigo, right ear: Secondary | ICD-10-CM

## 2018-07-10 DIAGNOSIS — Z23 Encounter for immunization: Secondary | ICD-10-CM

## 2018-07-10 NOTE — Progress Notes (Signed)
Subjective:    Patient ID: Anna [name], female    DOB: [birth year], 82 y.o.   MRN: [id number]  HPI 82 year old female comes in today complaining of vertigo.  He complains of intermittent dizzy spells over the last 4 to 5 weeks.  In fact she went to local urgent care about a week ago for the symptoms. She has had 3 episodes. The first one lasted 3 days. The 2nd on lasted 2 days and then happened again on 07/04/18 to Urgent Care.  He had a negative head CT performed on July 04, 2018 only some mild chronic microvascular ischemic change.  No sinus disease.  No visual changes.  Does feel better today but just a little off.  She says the meclizine that they gave her does seem to help but it has really ramped up her anxiety.  Study Result   CLINICAL DATA:  Pt c/o dizziness, vertigo for the past two days, mohs surgery, cataract ext., macular degeneration, hyperlipidemia  EXAM: CT HEAD WITHOUT CONTRAST  TECHNIQUE: Contiguous axial images were obtained from the base of the skull through the vertex without intravenous contrast.  COMPARISON:  None.  FINDINGS: Brain: No evidence of acute infarction, hemorrhage, hydrocephalus, extra-axial collection or mass lesion/mass effect.  There are patchy areas of white matter hypoattenuation consistent with mild chronic microvascular ischemic change.  Vascular: No hyperdense vessel or unexpected calcification.  Skull: Normal. Negative for fracture or focal lesion.  Sinuses/Orbits: Globes and orbits are unremarkable. Visualized sinuses and mastoid air cells are clear.  Other: None.  IMPRESSION: 1. No acute intracranial abnormalities. 2. Mild chronic microvascular ischemic change.   Electronically Signed   By: Lajean Manes M.D.   On: 07/04/2018 13:15      Review of Systems  BP 123/69   Pulse 86   Ht 5\' 3"  (1.6 m)   Wt 183 lb (83 kg)   SpO2 94%   BMI 32.42 kg/m     Allergies  Allergen Reactions  . Aleve [Naproxen  Sodium]     Causes elevation in cholesterol   . Azithromycin Rash    Past Medical History:  Diagnosis Date  . Hiatal hernia   . History of basal cell carcinoma 07/20/2017   On nose.   Marland Kitchen History of melanoma 07/20/2017   Right upper arm   . Hyperlipidemia   . Macular degeneration     Past Surgical History:  Procedure Laterality Date  . BREAST CYST EXCISION Right 09/1962  . CATARACT EXTRACTION  03/2015  . melonoma Right 06/2016   arm  . MOHS SURGERY Left 06/2016   nose    Social History   Socioeconomic History  . Marital status: Single    Spouse name: Not on file  . Number of children: Not on file  . Years of education: 12+  . Highest education level: Not on file  Occupational History  . 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  Social Needs  . Financial resource strain: Not on file  . Food insecurity:    Worry: Not on file    Inability: Not on file  . Transportation needs:    Medical: Not on file    Non-medical: Not on file  Tobacco Use  . Smoking status: Never Smoker  . Smokeless tobacco: Never Used  Substance and Sexual Activity  . Alcohol use: Yes    Alcohol/week: 1.0 standard drinks    Types: 1 Glasses of wine  per week  . Drug use: No  . Sexual activity: Never  Lifestyle  . Physical activity:    Days per week: Not on file    Minutes per session: Not on file  . Stress: Not on file  Relationships  . Social connections:    Talks on phone: Not on file    Gets together: Not on file    Attends religious service: Not on file    Active member of club or organization: Not on file    Attends meetings of clubs or organizations: Not on file    Relationship status: Not on file  . Intimate partner violence:    Fear of current or ex partner: Not on file    Emotionally abused: Not on file    Physically abused: Not on file    Forced sexual activity: Not on file  Other Topics Concern  . Not on file   Social History Narrative   She has a Scientist, water quality in Film/video editor.  He lives alone in a retirement community.  Her sister is Pharmacist, community    Family History  Problem Relation Age of Onset  . Hyperlipidemia Sister   . Macular degeneration Sister   . Cancer - Prostate Brother   . Atrial fibrillation Maternal Aunt   . Cancer Mother        pancreatic  . Cancer Father        liver    Outpatient Encounter Medications as of 07/10/2018  Medication Sig  . ALPRAZolam (XANAX) 0.25 MG tablet Take 0.25 mg by mouth at bedtime as needed for sleep.  . calcium citrate-vitamin D (CITRACAL+D) 315-200 MG-UNIT tablet Take 1 tablet by mouth 2 (two) times daily.  . Flaxseed Oil OIL 1,000 mg.  . latanoprost (XALATAN) 0.005 % ophthalmic solution Place 1 drop into both eyes daily.  Marland Kitchen lovastatin (MEVACOR) 40 MG tablet TAKE ONE TABLET BY MOUTH NIGHTLY  . Lysine 500 MG CAPS Take 1 capsule by mouth daily.  . meclizine (ANTIVERT) 25 MG tablet Take 1 tablet (25 mg total) by mouth 3 (three) times daily as needed for dizziness.  . Multiple Vitamins-Minerals (PRESERVISION AREDS 2) CAPS Take by mouth.  Marland Kitchen MYRBETRIQ 25 MG TB24 tablet Take 1 tablet (25 mg total) by mouth daily.  Marland Kitchen omeprazole (PRILOSEC) 40 MG capsule Take 1 capsule (40 mg total) daily by mouth.  . sucralfate (CARAFATE) 1 g tablet TAKE 1 TABLET BY MOUTH TWICE DAILY  . timolol (BETIMOL) 0.5 % ophthalmic solution Apply 1 drop to eye 2 (two) times daily.  . TURMERIC PO Take 1,000 mg by mouth.  Marland Kitchen UNABLE TO FIND Right eye injection every 5-6 weeks for Macular Degeneration  . vitamin B-12 (CYANOCOBALAMIN) 1000 MCG tablet Take 1,000 mcg by mouth daily.   . vitamin E 400 UNIT capsule Take 1 capsule by mouth daily.   No facility-administered encounter medications on file as of 07/10/2018.          Objective:   Physical Exam  Constitutional: She is oriented to person, place, and time. She appears well-developed and well-nourished.  HENT:  Head:  Normocephalic and atraumatic.  Right Ear: External ear normal.  Left Ear: External ear normal.  Nose: Nose normal.  Mouth/Throat: Oropharynx is clear and moist.  TMs and canals are clear.   Eyes: Pupils are equal, round, and reactive to light. Conjunctivae and EOM are normal.  Neck: Neck supple. No thyromegaly present.  Cardiovascular: Normal rate, regular rhythm and normal heart sounds.  Pulmonary/Chest: Effort normal and breath sounds normal. She has no wheezes.  Lymphadenopathy:    She has no cervical adenopathy.  Neurological: She is alert and oriented to person, place, and time. No cranial nerve deficit.  Dix-Hallpike maneuver to the right re-created her symptoms that she did not have nystagmus on exam.  She noticed it more when we were getting ready to sit up.  She had absence of symptoms on the left side.  Skin: Skin is warm and dry.  Psychiatric: She has a normal mood and affect.        Assessment & Plan:  Benign paroxysmal positional vertigo-discussed diagnosis.  Reassured her that the CT scan at least rules out mass, hemorrhage, stroke, sinus infection etc.  We were able to re-create some of her symptoms with a Dix-Hallpike maneuver to the right when we she was coming back up.  I did not see any distinct nystagmus but she was also feeling a lot better today.  We will give her a handout for home exercises to do on her own but will do them for the right side.  If she is not feeling better in 1 week then plan will be to refer her to vestibular rehab for more formal PT. H.O provided.

## 2018-07-17 NOTE — Progress Notes (Signed)
Subjective:   Anna [name] is a 82 y.o. female who presents for an Initial Medicare Annual Wellness Visit.  Review of Systems    No ROS.  Medicare Wellness Visit. Additional risk factors are reflected in the social history.   Cardiac Risk Factors include: advanced age (>52men, >53 women);sedentary lifestyle;obesity (BMI >30kg/m2) Sleep patterns: Sleeps 9 hours a night. Wakes up 2 times during the night to use the bathroom.  Home Safety/Smoke Alarms: Feels safe in home. Smoke alarms in place.  Living environment; Lives alone no stairs has elevator. Shower is a walk in shower with grab bars and bench in shower.    Female:   Pap-  Aged out     Mammo- scheduled      Dexa scan-  scheduled      CCS- aged out unless necessary      Objective:    There were no vitals filed for this visit. There is no height or weight on file to calculate BMI.  Advanced Directives 07/25/2018 07/10/2018 07/17/2017  Does Patient Have a Medical Advance Directive? Yes Yes No  Type of Advance Directive Living will;Healthcare Power of Attorney Living will -  Does patient want to make changes to medical advance directive? Yes (MAU/Ambulatory/Procedural Areas - Information given) - -  Copy of [place] in Chart? Yes - -  Would patient like information on creating a medical advance directive? - - No - Patient declined    Current Medications (verified) Outpatient Encounter Medications as of 07/25/2018  Medication Sig  . ALPRAZolam (XANAX) 0.25 MG tablet Take 0.25 mg by mouth at bedtime as needed for sleep.  . Bevacizumab 2.75 MG/0.11ML SOSY Inject into the eye.  . calcium citrate-vitamin D (CITRACAL+D) 315-200 MG-UNIT tablet Take 1 tablet by mouth 2 (two) times daily.  . Flaxseed Oil OIL 1,000 mg.  . Javier Docker Oil 1000 MG CAPS Take 1,000 mg by mouth.  . latanoprost (XALATAN) 0.005 % ophthalmic solution Place 1 drop into both eyes daily.  Marland Kitchen lovastatin (MEVACOR) 40 MG tablet TAKE ONE TABLET  BY MOUTH NIGHTLY  . Lysine 500 MG CAPS Take 1 capsule by mouth daily.  . meclizine (ANTIVERT) 25 MG tablet Take 1 tablet (25 mg total) by mouth 3 (three) times daily as needed for dizziness.  . Multiple Vitamins-Minerals (PRESERVISION AREDS 2) CAPS Take by mouth.  Marland Kitchen MYRBETRIQ 25 MG TB24 tablet Take 1 tablet (25 mg total) by mouth daily.  Marland Kitchen omeprazole (PRILOSEC) 40 MG capsule Take 1 capsule (40 mg total) daily by mouth.  . simethicone (MYLICON) 361 MG chewable tablet Chew 125 mg by mouth every 6 (six) hours as needed for flatulence.  . sucralfate (CARAFATE) 1 g tablet TAKE 1 TABLET BY MOUTH TWICE DAILY  . timolol (BETIMOL) 0.5 % ophthalmic solution Apply 1 drop to eye 2 (two) times daily.  . TURMERIC PO Take 1,000 mg by mouth.  Marland Kitchen UNABLE TO FIND Right eye injection every 5-6 weeks for Macular Degeneration  . vitamin B-12 (CYANOCOBALAMIN) 1000 MCG tablet Take 1,000 mcg by mouth daily.   . vitamin E 400 UNIT capsule Take 1 capsule by mouth daily.   No facility-administered encounter medications on file as of 07/25/2018.     Allergies (verified) Aleve [naproxen sodium] and Azithromycin   History: Past Medical History:  Diagnosis Date  . Hiatal hernia   . History of basal cell carcinoma 07/20/2017   On nose.   Marland Kitchen History of melanoma 07/20/2017   Right upper arm   .  Hyperlipidemia   . Macular degeneration    Past Surgical History:  Procedure Laterality Date  . BREAST CYST EXCISION Right 09/1962  . CATARACT EXTRACTION  03/2015  . melonoma Right 06/2016   arm  . MOHS SURGERY Left 06/2016   nose   Family History  Problem Relation Age of Onset  . Hyperlipidemia Sister   . Macular degeneration Sister   . Cancer - Prostate Brother   . Atrial fibrillation Maternal Aunt   . Cancer Mother        pancreatic  . Cancer Father        liver   Social History   Socioeconomic History  . Marital status: Single    Spouse name: Not on file  . Number of children: Not on file  . Years of  education: 12+  . Highest education level: Master's degree (e.g., MA, MS, MEng, MEd, MSW, MBA)  Occupational History  . 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  Social Needs  . Financial resource strain: Not hard at all  . Food insecurity:    Worry: Never true    Inability: Never true  . Transportation needs:    Medical: No    Non-medical: No  Tobacco Use  . Smoking status: Never Smoker  . Smokeless tobacco: Never Used  Substance and Sexual Activity  . Alcohol use: Yes    Alcohol/week: 1.0 standard drinks    Types: 1 Glasses of wine per week  . Drug use: No  . Sexual activity: Never  Lifestyle  . Physical activity:    Days per week: 2 days    Minutes per session: 60 min  . Stress: Not at all  Relationships  . Social connections:    Talks on phone: More than three times a week    Gets together: Twice a week    Attends religious service: More than 4 times per year    Active member of club or organization: Yes    Attends meetings of clubs or organizations: 1 to 4 times per year    Relationship status: Never married  Other Topics Concern  . Not on file  Social History Narrative   She has a Scientist, water quality in Film/video editor.  He lives alone in a retirement community.  Her sister is Pharmacist, community. Has joined The fitness program in [place] and is going to start with the gym.    Tobacco Counseling Counseling given: Not Answered   Clinical Intake:                        Activities of Daily Living In your present state of health, do you have any difficulty performing the following activities: 07/25/2018  Hearing? N  Vision? N  Difficulty concentrating or making decisions? Y  Comment short term memory loss- not all the time  Walking or climbing stairs? N  Dressing or bathing? N  Doing errands, shopping? N  Preparing Food and eating ? N  Using the Toilet? N  In the past six  months, have you accidently leaked urine? N  Do you have problems with loss of bowel control? N  Managing your Medications? N  Managing your Finances? N  Housekeeping or managing your Housekeeping? N  Some recent data might be hidden     Immunizations and Health Maintenance Immunization History  Administered Date(s) Administered  . Influenza, High Dose Seasonal PF 08/03/2015,  06/30/2017, 07/10/2018   Health Maintenance Due  Topic Date Due  . DEXA SCAN  12/02/2002    Patient Care Team: Hali Marry, MD as PCP - General (Family Medicine) Leonia Corona, MD as Referring Physician (Ophthalmology)  Indicate any recent Medical Services you may have received from other than [facility] providers in the past year (date may be approximate).     Assessment:   This is a routine wellness examination for Cashae.Physical assessment deferred to PCP.   Hearing/Vision screen  Visual Acuity Screening   Right eye Left eye Both eyes  Without correction: glaucoma 20/40 20/40  With correction:     Hearing Screening Comments: Patient was able to repeat back all 3 words that were whispered.  Dietary issues and exercise activities discussed: Current Exercise Habits: Structured exercise class, Type of exercise: walking;strength training/weights;stretching, Time (Minutes): 40, Frequency (Times/Week): 2, Weekly Exercise (Minutes/Week): 80, Intensity: Mild, Exercise limited by: None identified Diet  Eats a healthy diet with veggiess and fruits Breakfast: banana and cereal and coffee Lunch: cornbread and coffee Dinner: meat and vegetable      Goals    . water     Start with the fitness center and start the silver sneakers program. Increase water intake to 64 ounces daily.      Depression Screen PHQ 2/9 Scores 07/25/2018 07/10/2018 07/17/2017  PHQ - 2 Score 0 0 0    Fall Risk Fall Risk  07/25/2018 07/10/2018 07/17/2017  Falls in the past year? No No No  Risk for fall due to : Impaired  mobility - -    Is the patient's home free of loose throw rugs in walkways, pet beds, electrical cords, etc?   yes      Grab bars in the bathroom? yes      Handrails on the stairs?   yes      Adequate lighting?   yes  Cognitive Function:     6CIT Screen 07/25/2018  What Year? 0 points  What month? 0 points  What time? 0 points  Count back from 20 0 points  Months in reverse 0 points  Repeat phrase 2 points  Total Score 2    Screening Tests Health Maintenance  Topic Date Due  . DEXA SCAN  12/02/2002  . TETANUS/TDAP  12/01/2018 (Originally 12/01/1954)  . PNA vac Low Risk Adult (1 of 2 - PCV13) 12/01/2018 (Originally 12/02/2002)  . INFLUENZA VACCINE  Completed      Plan:    Please schedule your next medicare wellness visit with me in 1 yr.  Ms. Fedrick , Thank you for taking time to come for your Medicare Wellness Visit. I appreciate your ongoing commitment to your health goals. Please review the following plan we discussed and let me know if I can assist you in the future.  Continue doing brain stimulating activities (puzzles, reading, adult coloring books, staying active) to keep memory sharp.    These are the goals we discussed: Goals    . water     Start with the fitness center and start the silver sneakers program. Increase water intake to 64 ounces daily.       This is a list of the screening recommended for you and due dates:  Health Maintenance  Topic Date Due  . DEXA scan (bone density measurement)  12/02/2002  . Tetanus Vaccine  12/01/2018*  . Pneumonia vaccines (1 of 2 - PCV13) 12/01/2018*  . Flu Shot  Completed  *Topic was postponed. The date  shown is not the original due date.     I have personally reviewed and noted the following in the patient's chart:   . Medical and social history . Use of alcohol, tobacco or illicit drugs  . Current medications and supplements . Functional ability and status . Nutritional status . Physical  activity . Advanced directives . List of other physicians . Hospitalizations, surgeries, and ER visits in previous 12 months . Vitals . Screenings to include cognitive, depression, and falls . Referrals and appointments  In addition, I have reviewed and discussed with patient certain preventive protocols, quality metrics, and best practice recommendations. A written personalized care plan for preventive services as well as general preventive health recommendations were provided to patient.     Joanne Chars, LPN   86/76/1948

## 2018-07-25 ENCOUNTER — Encounter: Payer: Medicare Other | Admitting: Family Medicine

## 2018-07-25 ENCOUNTER — Ambulatory Visit (INDEPENDENT_AMBULATORY_CARE_PROVIDER_SITE_OTHER): Payer: Medicare Other | Admitting: *Deleted

## 2018-07-25 VITALS — BP 130/68 | HR 68 | Ht 63.0 in | Wt 186.0 lb

## 2018-07-25 DIAGNOSIS — E782 Mixed hyperlipidemia: Secondary | ICD-10-CM | POA: Diagnosis not present

## 2018-07-25 DIAGNOSIS — Z1239 Encounter for other screening for malignant neoplasm of breast: Secondary | ICD-10-CM

## 2018-07-25 DIAGNOSIS — Z13820 Encounter for screening for osteoporosis: Secondary | ICD-10-CM | POA: Diagnosis not present

## 2018-07-25 DIAGNOSIS — H8111 Benign paroxysmal vertigo, right ear: Secondary | ICD-10-CM

## 2018-07-25 DIAGNOSIS — R7301 Impaired fasting glucose: Secondary | ICD-10-CM

## 2018-07-25 DIAGNOSIS — Z0000 Encounter for general adult medical examination without abnormal findings: Secondary | ICD-10-CM

## 2018-07-25 NOTE — Patient Instructions (Addendum)
Please schedule your next medicare wellness visit with me in 1 yr.  Anna [name] , Thank you for taking time to come for your Medicare Wellness Visit. I appreciate your ongoing commitment to your health goals. Please review the following plan we discussed and let me know if I can assist you in the future.  Continue doing brain stimulating activities (puzzles, reading, adult coloring books, staying active) to keep memory sharp.   Please schedule your next medicare wellness visit with me in 1 yr.    These are the goals we discussed: Goals    . water     Start with the fitness center and start the silver sneakers program. Increase water intake to 64 ounces daily.     Health Maintenance for Postmenopausal Women Menopause is a normal process in which your reproductive ability comes to an end. This process happens gradually over a span of months to years, usually between the ages of 21 and 58. Menopause is complete when you have missed 12 consecutive menstrual periods. It is important to talk with your health care provider about some of the most common conditions that affect postmenopausal women, such as heart disease, cancer, and bone loss (osteoporosis). Adopting a healthy lifestyle and getting preventive care can help to promote your health and wellness. Those actions can also lower your chances of developing some of these common conditions. What should I know about menopause? During menopause, you may experience a number of symptoms, such as:  Moderate-to-severe hot flashes.  Night sweats.  Decrease in sex drive.  Mood swings.  Headaches.  Tiredness.  Irritability.  Memory problems.  Insomnia.  Choosing to treat or not to treat menopausal changes is an individual decision that you make with your health care provider. What should I know about hormone replacement therapy and supplements? Hormone therapy products are effective for treating symptoms that are associated with  menopause, such as hot flashes and night sweats. Hormone replacement carries certain risks, especially as you become older. If you are thinking about using estrogen or estrogen with progestin treatments, discuss the benefits and risks with your health care provider. What should I know about heart disease and stroke? Heart disease, heart attack, and stroke become more likely as you age. This may be due, in part, to the hormonal changes that your body experiences during menopause. These can affect how your body processes dietary fats, triglycerides, and cholesterol. Heart attack and stroke are both medical emergencies. There are many things that you can do to help prevent heart disease and stroke:  Have your blood pressure checked at least every 1-2 years. High blood pressure causes heart disease and increases the risk of stroke.  If you are 68-66 years old, ask your health care provider if you should take aspirin to prevent a heart attack or a stroke.  Do not use any tobacco products, including cigarettes, chewing tobacco, or electronic cigarettes. If you need help quitting, ask your health care provider.  It is important to eat a healthy diet and maintain a healthy weight. ? Be sure to include plenty of vegetables, fruits, low-fat dairy products, and lean protein. ? Avoid eating foods that are high in solid fats, added sugars, or salt (sodium).  Get regular exercise. This is one of the most important things that you can do for your health. ? Try to exercise for at least 150 minutes each week. The type of exercise that you do should increase your heart rate and make you sweat. This  is known as moderate-intensity exercise. ? Try to do strengthening exercises at least twice each week. Do these in addition to the moderate-intensity exercise.  Know your numbers.Ask your health care provider to check your cholesterol and your blood glucose. Continue to have your blood tested as directed by your health  care provider.  What should I know about cancer screening? There are several types of cancer. Take the following steps to reduce your risk and to catch any cancer development as early as possible. Breast Cancer  Practice breast self-awareness. ? This means understanding how your breasts normally appear and feel. ? It also means doing regular breast self-exams. Let your health care provider know about any changes, no matter how small.  If you are 20 or older, have a clinician do a breast exam (clinical breast exam or CBE) every year. Depending on your age, family history, and medical history, it may be recommended that you also have a yearly breast X-ray (mammogram).  If you have a family history of breast cancer, talk with your health care provider about genetic screening.  If you are at high risk for breast cancer, talk with your health care provider about having an MRI and a mammogram every year.  Breast cancer (BRCA) gene test is recommended for women who have family members with BRCA-related cancers. Results of the assessment will determine the need for genetic counseling and BRCA1 and for BRCA2 testing. BRCA-related cancers include these types: ? Breast. This occurs in males or females. ? Ovarian. ? Tubal. This may also be called fallopian tube cancer. ? Cancer of the abdominal or pelvic lining (peritoneal cancer). ? Prostate. ? Pancreatic.  Cervical, Uterine, and Ovarian Cancer Your health care provider may recommend that you be screened regularly for cancer of the pelvic organs. These include your ovaries, uterus, and vagina. This screening involves a pelvic exam, which includes checking for microscopic changes to the surface of your cervix (Pap test).  For women ages 21-65, health care providers may recommend a pelvic exam and a Pap test every three years. For women ages 38-65, they may recommend the Pap test and pelvic exam, combined with testing for human papilloma virus (HPV),  every five years. Some types of HPV increase your risk of cervical cancer. Testing for HPV may also be done on women of any age who have unclear Pap test results.  Other health care providers may not recommend any screening for nonpregnant women who are considered low risk for pelvic cancer and have no symptoms. Ask your health care provider if a screening pelvic exam is right for you.  If you have had past treatment for cervical cancer or a condition that could lead to cancer, you need Pap tests and screening for cancer for at least 20 years after your treatment. If Pap tests have been discontinued for you, your risk factors (such as having a new sexual partner) need to be reassessed to determine if you should start having screenings again. Some women have medical problems that increase the chance of getting cervical cancer. In these cases, your health care provider may recommend that you have screening and Pap tests more often.  If you have a family history of uterine cancer or ovarian cancer, talk with your health care provider about genetic screening.  If you have vaginal bleeding after reaching menopause, tell your health care provider.  There are currently no reliable tests available to screen for ovarian cancer.  Lung Cancer Lung cancer screening is recommended for  adults 4-71 years old who are at high risk for lung cancer because of a history of smoking. A yearly low-dose CT scan of the lungs is recommended if you:  Currently smoke.  Have a history of at least 30 pack-years of smoking and you currently smoke or have quit within the past 15 years. A pack-year is smoking an average of one pack of cigarettes per day for one year.  Yearly screening should:  Continue until it has been 15 years since you quit.  Stop if you develop a health problem that would prevent you from having lung cancer treatment.  Colorectal Cancer  This type of cancer can be detected and can often be  prevented.  Routine colorectal cancer screening usually begins at age 69 and continues through age 74.  If you have risk factors for colon cancer, your health care provider may recommend that you be screened at an earlier age.  If you have a family history of colorectal cancer, talk with your health care provider about genetic screening.  Your health care provider may also recommend using home test kits to check for hidden blood in your stool.  A small camera at the end of a tube can be used to examine your colon directly (sigmoidoscopy or colonoscopy). This is done to check for the earliest forms of colorectal cancer.  Direct examination of the colon should be repeated every 5-10 years until age 106. However, if early forms of precancerous polyps or small growths are found or if you have a family history or genetic risk for colorectal cancer, you may need to be screened more often.  Skin Cancer  Check your skin from head to toe regularly.  Monitor any moles. Be sure to tell your health care provider: ? About any new moles or changes in moles, especially if there is a change in a mole's shape or color. ? If you have a mole that is larger than the size of a pencil eraser.  If any of your family members has a history of skin cancer, especially at a young age, talk with your health care provider about genetic screening.  Always use sunscreen. Apply sunscreen liberally and repeatedly throughout the day.  Whenever you are outside, protect yourself by wearing long sleeves, pants, a wide-brimmed hat, and sunglasses.  What should I know about osteoporosis? Osteoporosis is a condition in which bone destruction happens more quickly than new bone creation. After menopause, you may be at an increased risk for osteoporosis. To help prevent osteoporosis or the bone fractures that can happen because of osteoporosis, the following is recommended:  If you are 81-47 years old, get at least 1,000 mg of  calcium and at least 600 mg of vitamin D per day.  If you are older than age 76 but younger than age 67, get at least 1,200 mg of calcium and at least 600 mg of vitamin D per day.  If you are older than age 76, get at least 1,200 mg of calcium and at least 800 mg of vitamin D per day.  Smoking and excessive alcohol intake increase the risk of osteoporosis. Eat foods that are rich in calcium and vitamin D, and do weight-bearing exercises several times each week as directed by your health care provider. What should I know about how menopause affects my mental health? Depression may occur at any age, but it is more common as you become older. Common symptoms of depression include:  Low or sad mood.  Changes in sleep patterns.  Changes in appetite or eating patterns.  Feeling an overall lack of motivation or enjoyment of activities that you previously enjoyed.  Frequent crying spells.  Talk with your health care provider if you think that you are experiencing depression. What should I know about immunizations? It is important that you get and maintain your immunizations. These include:  Tetanus, diphtheria, and pertussis (Tdap) booster vaccine.  Influenza every year before the flu season begins.  Pneumonia vaccine.  Shingles vaccine.  Your health care provider may also recommend other immunizations. This information is not intended to replace advice given to you by your health care provider. Make sure you discuss any questions you have with your health care provider. Document Released: 11/04/2005 Document Revised: 04/01/2016 Document Reviewed: 06/16/2015 Elsevier Interactive Patient Education  2018 [employer].

## 2018-07-27 ENCOUNTER — Other Ambulatory Visit: Payer: Self-pay | Admitting: Family Medicine

## 2018-07-27 ENCOUNTER — Other Ambulatory Visit: Payer: Self-pay | Admitting: *Deleted

## 2018-07-27 DIAGNOSIS — K219 Gastro-esophageal reflux disease without esophagitis: Secondary | ICD-10-CM

## 2018-07-27 MED ORDER — OMEPRAZOLE 40 MG PO CPDR: 40.0000 mg | DELAYED_RELEASE_CAPSULE | Freq: Every day | ORAL | 3 refills | Status: DC

## 2018-07-30 DIAGNOSIS — E782 Mixed hyperlipidemia: Secondary | ICD-10-CM | POA: Diagnosis not present

## 2018-07-30 DIAGNOSIS — R7301 Impaired fasting glucose: Secondary | ICD-10-CM | POA: Diagnosis not present

## 2018-07-30 DIAGNOSIS — Z0000 Encounter for general adult medical examination without abnormal findings: Secondary | ICD-10-CM | POA: Diagnosis not present

## 2018-07-31 LAB — COMPLETE METABOLIC PANEL WITH GFR
AG Ratio: 2 (calc) (ref 1.0–2.5)
ALBUMIN MSPROF: 4.1 g/dL (ref 3.6–5.1)
ALT: 8 U/L (ref 6–29)
AST: 25 U/L (ref 10–35)
Alkaline phosphatase (APISO): 61 U/L (ref 33–130)
BUN: 9 mg/dL (ref 7–25)
CALCIUM: 9.2 mg/dL (ref 8.6–10.4)
CO2: 28 mmol/L (ref 20–32)
CREATININE: 0.83 mg/dL (ref 0.60–0.88)
Chloride: 106 mmol/L (ref 98–110)
GFR, EST AFRICAN AMERICAN: 77 mL/min/{1.73_m2} (ref >=60)
GFR, EST NON AFRICAN AMERICAN: 67 mL/min/{1.73_m2} (ref >=60)
GLOBULIN: 2.1 g/dL (ref 1.9–3.7)
Glucose, Bld: 99 mg/dL (ref 65–99)
Potassium: 4.6 mmol/L (ref 3.5–5.3)
SODIUM: 141 mmol/L (ref 135–146)
TOTAL PROTEIN: 6.2 g/dL (ref 6.1–8.1)
Total Bilirubin: 0.8 mg/dL (ref 0.2–1.2)

## 2018-07-31 LAB — LIPID PANEL
CHOL/HDL RATIO: 3.7 (calc) (ref <5.0)
CHOLESTEROL: 163 mg/dL (ref <200)
HDL: 44 mg/dL — AB (ref >50)
LDL Cholesterol (Calc): 89 mg/dL (calc)
Non-HDL Cholesterol (Calc): 119 mg/dL (calc) (ref <130)
Triglycerides: 205 mg/dL — ABNORMAL HIGH (ref <150)

## 2018-07-31 LAB — HEMOGLOBIN A1C
EAG (MMOL/L): 6.6 (calc)
Hgb A1c MFr Bld: 5.8 % of total Hgb — ABNORMAL HIGH (ref <5.7)
MEAN PLASMA GLUCOSE: 120 (calc)

## 2018-08-08 ENCOUNTER — Ambulatory Visit (INDEPENDENT_AMBULATORY_CARE_PROVIDER_SITE_OTHER): Payer: Medicare Other

## 2018-08-08 DIAGNOSIS — Z1231 Encounter for screening mammogram for malignant neoplasm of breast: Secondary | ICD-10-CM | POA: Diagnosis not present

## 2018-08-08 DIAGNOSIS — M85851 Other specified disorders of bone density and structure, right thigh: Secondary | ICD-10-CM

## 2018-08-09 DIAGNOSIS — H353211 Exudative age-related macular degeneration, right eye, with active choroidal neovascularization: Secondary | ICD-10-CM | POA: Diagnosis not present

## 2018-08-20 ENCOUNTER — Encounter: Payer: Self-pay | Admitting: Family Medicine

## 2018-08-20 DIAGNOSIS — H401131 Primary open-angle glaucoma, bilateral, mild stage: Secondary | ICD-10-CM | POA: Diagnosis not present

## 2018-08-20 DIAGNOSIS — H353211 Exudative age-related macular degeneration, right eye, with active choroidal neovascularization: Secondary | ICD-10-CM | POA: Diagnosis not present

## 2018-08-22 ENCOUNTER — Other Ambulatory Visit: Payer: Self-pay | Admitting: *Deleted

## 2018-08-22 DIAGNOSIS — K219 Gastro-esophageal reflux disease without esophagitis: Secondary | ICD-10-CM

## 2018-08-22 MED ORDER — SUCRALFATE 1 G PO TABS
1.0000 g | ORAL_TABLET | Freq: Two times a day (BID) | ORAL | 1 refills | Status: DC
Start: 2018-08-22 — End: 2019-02-04

## 2018-09-27 ENCOUNTER — Encounter: Payer: Self-pay | Admitting: Family Medicine

## 2018-11-01 DIAGNOSIS — H353211 Exudative age-related macular degeneration, right eye, with active choroidal neovascularization: Secondary | ICD-10-CM | POA: Diagnosis not present

## 2018-11-01 DIAGNOSIS — H353122 Nonexudative age-related macular degeneration, left eye, intermediate dry stage: Secondary | ICD-10-CM | POA: Diagnosis not present

## 2018-11-29 ENCOUNTER — Other Ambulatory Visit: Payer: Self-pay | Admitting: Family Medicine

## 2019-01-03 ENCOUNTER — Encounter: Payer: Self-pay | Admitting: Family Medicine

## 2019-01-03 DIAGNOSIS — H353211 Exudative age-related macular degeneration, right eye, with active choroidal neovascularization: Secondary | ICD-10-CM | POA: Diagnosis not present

## 2019-02-04 ENCOUNTER — Other Ambulatory Visit: Payer: Self-pay | Admitting: *Deleted

## 2019-02-04 DIAGNOSIS — K219 Gastro-esophageal reflux disease without esophagitis: Secondary | ICD-10-CM

## 2019-02-04 MED ORDER — SUCRALFATE 1 G PO TABS: 1.0000 g | ORAL_TABLET | Freq: Two times a day (BID) | ORAL | 1 refills | Status: DC

## 2019-02-11 ENCOUNTER — Encounter: Payer: Self-pay | Admitting: Family Medicine

## 2019-02-11 DIAGNOSIS — H26493 Other secondary cataract, bilateral: Secondary | ICD-10-CM | POA: Diagnosis not present

## 2019-02-11 DIAGNOSIS — D3132 Benign neoplasm of left choroid: Secondary | ICD-10-CM | POA: Diagnosis not present

## 2019-02-11 DIAGNOSIS — H353122 Nonexudative age-related macular degeneration, left eye, intermediate dry stage: Secondary | ICD-10-CM | POA: Diagnosis not present

## 2019-02-11 DIAGNOSIS — H353211 Exudative age-related macular degeneration, right eye, with active choroidal neovascularization: Secondary | ICD-10-CM | POA: Diagnosis not present

## 2019-02-11 DIAGNOSIS — H527 Unspecified disorder of refraction: Secondary | ICD-10-CM | POA: Diagnosis not present

## 2019-02-11 DIAGNOSIS — H401131 Primary open-angle glaucoma, bilateral, mild stage: Secondary | ICD-10-CM | POA: Diagnosis not present

## 2019-02-23 ENCOUNTER — Other Ambulatory Visit: Payer: Self-pay | Admitting: Family Medicine

## 2019-02-23 ENCOUNTER — Other Ambulatory Visit: Payer: Self-pay | Admitting: *Deleted

## 2019-03-06 DIAGNOSIS — Z85828 Personal history of other malignant neoplasm of skin: Secondary | ICD-10-CM | POA: Diagnosis not present

## 2019-03-06 DIAGNOSIS — Z08 Encounter for follow-up examination after completed treatment for malignant neoplasm: Secondary | ICD-10-CM | POA: Diagnosis not present

## 2019-03-06 DIAGNOSIS — L304 Erythema intertrigo: Secondary | ICD-10-CM | POA: Diagnosis not present

## 2019-03-06 DIAGNOSIS — Z85820 Personal history of malignant melanoma of skin: Secondary | ICD-10-CM | POA: Diagnosis not present

## 2019-03-06 DIAGNOSIS — L821 Other seborrheic keratosis: Secondary | ICD-10-CM | POA: Diagnosis not present

## 2019-03-29 ENCOUNTER — Other Ambulatory Visit: Payer: Self-pay | Admitting: Family Medicine

## 2019-04-04 DIAGNOSIS — H353211 Exudative age-related macular degeneration, right eye, with active choroidal neovascularization: Secondary | ICD-10-CM | POA: Diagnosis not present

## 2019-06-18 DIAGNOSIS — H401131 Primary open-angle glaucoma, bilateral, mild stage: Secondary | ICD-10-CM | POA: Diagnosis not present

## 2019-07-01 DIAGNOSIS — Z85820 Personal history of malignant melanoma of skin: Secondary | ICD-10-CM | POA: Diagnosis not present

## 2019-07-01 DIAGNOSIS — L219 Seborrheic dermatitis, unspecified: Secondary | ICD-10-CM | POA: Diagnosis not present

## 2019-07-04 DIAGNOSIS — H353211 Exudative age-related macular degeneration, right eye, with active choroidal neovascularization: Secondary | ICD-10-CM | POA: Diagnosis not present

## 2019-07-05 ENCOUNTER — Other Ambulatory Visit: Payer: Self-pay | Admitting: Family Medicine

## 2019-07-05 DIAGNOSIS — Z1231 Encounter for screening mammogram for malignant neoplasm of breast: Secondary | ICD-10-CM

## 2019-07-15 ENCOUNTER — Other Ambulatory Visit: Payer: Self-pay | Admitting: Family Medicine

## 2019-07-15 DIAGNOSIS — K219 Gastro-esophageal reflux disease without esophagitis: Secondary | ICD-10-CM

## 2019-07-18 ENCOUNTER — Other Ambulatory Visit: Payer: Self-pay | Admitting: Family Medicine

## 2019-07-18 DIAGNOSIS — K219 Gastro-esophageal reflux disease without esophagitis: Secondary | ICD-10-CM

## 2019-07-29 DIAGNOSIS — L219 Seborrheic dermatitis, unspecified: Secondary | ICD-10-CM | POA: Diagnosis not present

## 2019-07-29 NOTE — Progress Notes (Signed)
Subjective:   Anna [name] is a 83 y.o. female who presents for Medicare Annual (Subsequent) preventive examination.  Review of Systems:  No ROS.  Medicare Wellness Virtual Visit.  Visual/audio telehealth visit, UTA vital signs.   See social history for additional risk factors.    Cardiac Risk Factors include: dyslipidemia;advanced age (>58men, >8 women) Sleep patterns: Getting 9 hours of sleep a night. Wakes up 2 times to void. Wakes up and feels rested and ready for the day.  Home Safety/Smoke Alarms: Feels safe in home. Smoke alarms in place.  Living environment; Lives alone in a 2 story on the 2nd floor uses elevator and the steps have handrails on them. Shower is a walk in shower and grab bars in place.Slip mat in place as well. Seat Belt Safety/Bike Helmet: Wears seat belt.   Female:   Pap- Aged out      Mammo- Aged out       Dexa scan-  UTD      CCS- Aged out     Objective:     Vitals: BP (!) 153/69   Pulse 87   Temp 98.4 F (36.9 C) (Oral)   Ht 5\' 3"  (1.6 m)   Wt 188 lb (85.3 kg)   SpO2 95%   BMI 33.30 kg/m   Body mass index is 33.3 kg/m.  Advanced Directives 07/30/2019 07/25/2018 07/10/2018 07/17/2017  Does Patient Have a Medical Advance Directive? Yes Yes Yes No  Type of Paramedic of [place];Living will Living will;Healthcare Power of Attorney Living will -  Does patient want to make changes to medical advance directive? No - Patient declined Yes (MAU/Ambulatory/Procedural Areas - Information given) - -  Copy of [place] in Chart? Yes - validated most recent copy scanned in chart (See row information) Yes - -  Would patient like information on creating a medical advance directive? - - - No - Patient declined    Tobacco Social History   Tobacco Use  Smoking Status Never Smoker  Smokeless Tobacco Never Used     Counseling given: No   Clinical Intake:  Pre-visit preparation completed: Yes  Pain :  No/denies pain     Nutritional Risks: None Diabetes: No  How often do you need to have someone help you when you read instructions, pamphlets, or other written materials from your doctor or pharmacy?: 1 - Never What is the last grade level you completed in school?: 18  Interpreter Needed?: No  Information entered by :: Orlie Dakin, LPN  Past Medical History:  Diagnosis Date  . Dermatitis    seborrehic scalp  . Hiatal hernia   . History of basal cell carcinoma 07/20/2017   On nose.   Marland Kitchen History of melanoma 07/20/2017   Right upper arm   . Hyperlipidemia   . Macular degeneration    Past Surgical History:  Procedure Laterality Date  . BREAST CYST EXCISION Right 09/1962  . CATARACT EXTRACTION  03/2015  . melonoma Right 06/2016   arm  . MOHS SURGERY Left 06/2016   nose   Family History  Problem Relation Age of Onset  . Hyperlipidemia Sister   . Macular degeneration Sister   . Cancer - Prostate Brother   . Atrial fibrillation Maternal Aunt   . Cancer Mother        pancreatic  . Cancer Father        liver   Social History   Socioeconomic History  . Marital status: Single  Spouse name: Not on file  . Number of children: Not on file  . Years of education: 12+  . Highest education level: Master's degree (e.g., MA, MS, MEng, MEd, MSW, MBA)  Occupational History  . 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  Social Needs  . Financial resource strain: Not hard at all  . Food insecurity    Worry: Never true    Inability: Never true  . Transportation needs    Medical: No    Non-medical: No  Tobacco Use  . Smoking status: Never Smoker  . Smokeless tobacco: Never Used  Substance and Sexual Activity  . Alcohol use: Yes    Alcohol/week: 1.0 standard drinks    Types: 1 Glasses of wine per week    Comment: occasionally  . Drug use: No  . Sexual activity: Never  Lifestyle  . Physical activity    Days per  week: 0 days    Minutes per session: 0 min  . Stress: Not at all  Relationships  . Social connections    Talks on phone: More than three times a week    Gets together: Once a week    Attends religious service: More than 4 times per year    Active member of club or organization: Yes    Attends meetings of clubs or organizations: 1 to 4 times per year    Relationship status: Never married  Other Topics Concern  . Not on file  Social History Narrative   She has a Scientist, water quality in Film/video editor.  He lives alone in a retirement community.  Her sister is Pharmacist, community.     Outpatient Encounter Medications as of 07/30/2019  Medication Sig  . ALPRAZolam (XANAX) 0.25 MG tablet Take 0.25 mg by mouth at bedtime as needed for sleep.  . AMBULATORY NON FORMULARY MEDICATION Take 5 mLs by mouth 2 (two) times daily. Swish 5 ML by mouth for 30 seconds and spit twice daily for two weeks  . Bevacizumab 2.75 MG/0.11ML SOSY Inject into the eye.  . calcium citrate-vitamin D (CITRACAL+D) 315-200 MG-UNIT tablet Take 1 tablet by mouth 2 (two) times daily.  . Flaxseed Oil OIL 1,000 mg.  . Javier Docker Oil 1000 MG CAPS Take 1,000 mg by mouth.  . latanoprost (XALATAN) 0.005 % ophthalmic solution Place 1 drop into both eyes daily.  Marland Kitchen lovastatin (MEVACOR) 40 MG tablet TAKE ONE TABLET BY MOUTH NIGHTLY  . Lysine 500 MG CAPS Take 1 capsule by mouth daily.  . meclizine (ANTIVERT) 25 MG tablet Take 1 tablet (25 mg total) by mouth 3 (three) times daily as needed for dizziness.  . Multiple Vitamins-Minerals (PRESERVISION AREDS 2) CAPS Take by mouth.  Marland Kitchen MYRBETRIQ 25 MG TB24 tablet Take 1 tablet (25 mg total) by mouth daily.  Marland Kitchen omeprazole (PRILOSEC) 40 MG capsule Take 1 capsule (40 mg total) by mouth daily.  . simethicone (MYLICON) 0000000 MG chewable tablet Chew 125 mg by mouth every 6 (six) hours as needed for flatulence.  . sucralfate (CARAFATE) 1 g tablet TAKE 1 TABLET BY MOUTH  TWICE DAILY  . timolol (BETIMOL) 0.5 % ophthalmic  solution Place 1 drop into the left eye 2 (two) times daily.  . TURMERIC PO Take 1,000 mg by mouth.  Marland Kitchen UNABLE TO FIND Right eye injection every 5-6 weeks for Macular Degeneration  . vitamin B-12 (CYANOCOBALAMIN) 1000 MCG tablet Take 1,000 mcg by mouth daily.   . [  DISCONTINUED] hydrocortisone 2.5 % cream Apply 1 application topically 2 (two) times daily.  . [DISCONTINUED] PROAIR HFA 108 (90 Base) MCG/ACT inhaler Inhale 1-2 puffs into the lungs every 6 (six) hours as needed for wheezing or shortness of breath.  . [DISCONTINUED] vitamin E 400 UNIT capsule Take 1 capsule by mouth daily.   No facility-administered encounter medications on file as of 07/30/2019.     Activities of Daily Living In your present state of health, do you have any difficulty performing the following activities: 07/30/2019  Hearing? N  Vision? N  Difficulty concentrating or making decisions? Y  Comment cant remember names or numbers  Walking or climbing stairs? N  Dressing or bathing? N  Doing errands, shopping? N  Preparing Food and eating ? N  Using the Toilet? N  In the past six months, have you accidently leaked urine? Y  Comment wears pad only when sneezes or coughs  Do you have problems with loss of bowel control? N  Managing your Medications? N  Managing your Finances? N  Housekeeping or managing your Housekeeping? N  Some recent data might be hidden    Patient Care Team: Hali Marry, MD as PCP - General (Family Medicine) Leonia Corona, MD as Referring Physician (Ophthalmology)    Assessment:   This is a routine wellness examination for [place].Physical assessment deferred to PCP.   Exercise Activities and Dietary recommendations Current Exercise Habits: The patient does not participate in regular exercise at present, Exercise limited by: None identified Diet Eats a fairly healthy diet of vegetables, fruits and proteins. Breakfast: cereal with banana and coffee Lunch: skips and may have  cheese and crackers Dinner: Meat and vegetables       Goals    . Exercise 3x per week (30 min per time)     Start back exercising and walking more since the COVID happpened.    . water     Start with the fitness center and start the silver sneakers program. Increase water intake to 64 ounces daily.       Fall Risk Fall Risk  07/30/2019 07/25/2018 07/10/2018 07/17/2017  Falls in the past year? 0 No No No  Number falls in past yr: 0 - - -  Injury with Fall? 0 - - -  Risk for fall due to : History of fall(s) Impaired mobility - -  Follow up Falls prevention discussed - - -   Is the patient's home free of loose throw rugs in walkways, pet beds, electrical cords, etc?   yes      Grab bars in the bathroom? yes      Handrails on the stairs?   yes      Adequate lighting?   yes  Depression Screen PHQ 2/9 Scores 07/30/2019 07/25/2018 07/10/2018 07/17/2017  PHQ - 2 Score 0 0 0 0     Cognitive Function     6CIT Screen 07/30/2019 07/25/2018  What Year? 0 points 0 points  What month? 0 points 0 points  What time? 0 points 0 points  Count back from 20 0 points 0 points  Months in reverse 0 points 0 points  Repeat phrase 0 points 2 points  Total Score 0 2    Immunization History  Administered Date(s) Administered  . Influenza, High Dose Seasonal PF 08/03/2015, 06/30/2017, 07/10/2018  . Tdap 09/26/2010, 09/26/2013    Screening Tests Health Maintenance  Topic Date Due  . PNA vac Low Risk Adult (1 of 2 - PCV13)  12/02/2002  . INFLUENZA VACCINE  04/27/2019  . TETANUS/TDAP  09/27/2023  . DEXA SCAN  Completed        Plan:    Please schedule your next medicare wellness visit with me in 1 yr.  Ms. League , Thank you for taking time to come for your Medicare Wellness Visit. I appreciate your ongoing commitment to your health goals. Please review the following plan we discussed and let me know if I can assist you in the future.   These are the goals we discussed: Goals    .  Exercise 3x per week (30 min per time)     Start back exercising and walking more since the COVID happpened.    . water     Start with the fitness center and start the silver sneakers program. Increase water intake to 64 ounces daily.       This is a list of the screening recommended for you and due dates:  Health Maintenance  Topic Date Due  . Pneumonia vaccines (1 of 2 - PCV13) 12/02/2002  . Flu Shot  04/27/2019  . Tetanus Vaccine  09/27/2023  . DEXA scan (bone density measurement)  Completed      I have personally reviewed and noted the following in the patient's chart:   . Medical and social history . Use of alcohol, tobacco or illicit drugs  . Current medications and supplements . Functional ability and status . Nutritional status . Physical activity . Advanced directives . List of other physicians . Hospitalizations, surgeries, and ER visits in previous 12 months . Vitals . Screenings to include cognitive, depression, and falls . Referrals and appointments  In addition, I have reviewed and discussed with patient certain preventive protocols, quality metrics, and best practice recommendations. A written personalized care plan for preventive services as well as general preventive health recommendations were provided to patient.     Joanne Chars, LPN  [id number]

## 2019-07-30 ENCOUNTER — Other Ambulatory Visit: Payer: Self-pay

## 2019-07-30 ENCOUNTER — Ambulatory Visit (INDEPENDENT_AMBULATORY_CARE_PROVIDER_SITE_OTHER): Payer: Medicare Other | Admitting: Family Medicine

## 2019-07-30 VITALS — BP 153/69 | HR 87 | Temp 98.4°F | Ht 63.0 in | Wt 188.0 lb

## 2019-07-30 DIAGNOSIS — Z0000 Encounter for general adult medical examination without abnormal findings: Secondary | ICD-10-CM

## 2019-07-30 MED ORDER — ALPRAZOLAM 0.25 MG PO TABS: 0.2500 mg | ORAL_TABLET | Freq: Every evening | ORAL | 0 refills | Status: DC | PRN

## 2019-07-30 NOTE — Patient Instructions (Signed)
Please schedule your next medicare wellness visit with me in 1 yr.  Ms. Baab , Thank you for taking time to come for your Medicare Wellness Visit. I appreciate your ongoing commitment to your health goals. Please review the following plan we discussed and let me know if I can assist you in the future.   These are the goals we discussed: Goals    . Exercise 3x per week (30 min per time)     Start back exercising and walking more since the COVID happpened.    . water     Start with the fitness center and start the silver sneakers program. Increase water intake to 64 ounces daily.

## 2019-08-08 ENCOUNTER — Ambulatory Visit (INDEPENDENT_AMBULATORY_CARE_PROVIDER_SITE_OTHER): Payer: Medicare Other | Admitting: Family Medicine

## 2019-08-08 ENCOUNTER — Encounter: Payer: Self-pay | Admitting: Family Medicine

## 2019-08-08 ENCOUNTER — Other Ambulatory Visit: Payer: Self-pay

## 2019-08-08 VITALS — BP 122/66 | HR 60 | Ht 63.0 in | Wt 191.0 lb

## 2019-08-08 DIAGNOSIS — G4700 Insomnia, unspecified: Secondary | ICD-10-CM | POA: Diagnosis not present

## 2019-08-08 DIAGNOSIS — R7301 Impaired fasting glucose: Secondary | ICD-10-CM

## 2019-08-08 DIAGNOSIS — N3281 Overactive bladder: Secondary | ICD-10-CM

## 2019-08-08 DIAGNOSIS — R062 Wheezing: Secondary | ICD-10-CM

## 2019-08-08 LAB — POCT GLYCOSYLATED HEMOGLOBIN (HGB A1C): Hemoglobin A1C: 5.7 % — AB (ref 4.0–5.6)

## 2019-08-08 NOTE — Progress Notes (Signed)
Established Patient Office Visit  Subjective:  Patient ID: Anna [name], female    DOB: 1936/02/17  Age: 83 y.o. MRN: [id number]  CC:  Chief Complaint  Patient presents with  . Hyperglycemia    HPI Anna [name] presents for   Impaired fasting glucose-no increased thirst or urination. No symptoms consistent with hypoglycemia. A1C of 5.7.    Is also requesting refill albuterol.  We do not have this on her active medication list but says she is noticed some intermittent wheezing.  She says it is mostly at night when she gets ready go to bed and first thing in the morning.  She said she has only noticed it since moving to [place].  She says normocephalic little squeaks so she has been using an albuterol inhaler that she had that she was given last year.  She denies any known underlying pulmonary disease such as asthma or COPD.  So not sure exactly how she got the albuterol inhaler.  Overactive bladder-she says the Myrbetriq works Chief Technology Officer.  She just does not take it every day she only takes it if she is going to leave the house.  Has been using her alprazolam half a tab as needed for sleep she admits she has been using it a little bit more this past year because of stress from Covid and the recent elections.  She says it just helps her mind shut off.  Past Medical History:  Diagnosis Date  . Dermatitis    seborrehic scalp  . Hiatal hernia   . History of basal cell carcinoma 07/20/2017   On nose.   Marland Kitchen History of melanoma 07/20/2017   Right upper arm   . Hyperlipidemia   . Macular degeneration     Past Surgical History:  Procedure Laterality Date  . BREAST CYST EXCISION Right 09/1962  . CATARACT EXTRACTION  03/2015  . melonoma Right 06/2016   arm  . MOHS SURGERY Left 06/2016   nose    Family History  Problem Relation Age of Onset  . Hyperlipidemia Sister   . Macular degeneration Sister   . Cancer - Prostate Brother   . Atrial fibrillation Maternal Aunt   .  Cancer Mother        pancreatic  . Cancer Father        liver    Social History   Socioeconomic History  . Marital status: Single    Spouse name: Not on file  . Number of children: Not on file  . Years of education: 12+  . Highest education level: Master's degree (e.g., MA, MS, MEng, MEd, MSW, MBA)  Occupational History  . 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  Social Needs  . Financial resource strain: Not hard at all  . Food insecurity    Worry: Never true    Inability: Never true  . Transportation needs    Medical: No    Non-medical: No  Tobacco Use  . Smoking status: Never Smoker  . Smokeless tobacco: Never Used  Substance and Sexual Activity  . Alcohol use: Yes    Alcohol/week: 1.0 standard drinks    Types: 1 Glasses of wine per week    Comment: occasionally  . Drug use: No  . Sexual activity: Never  Lifestyle  . Physical activity    Days per week: 0 days    Minutes per session: 0 min  . Stress: Not at all  Relationships  .  Social connections    Talks on phone: More than three times a week    Gets together: Once a week    Attends religious service: More than 4 times per year    Active member of club or organization: Yes    Attends meetings of clubs or organizations: 1 to 4 times per year    Relationship status: Never married  . Intimate partner violence    Fear of current or ex partner: No    Emotionally abused: No    Physically abused: No    Forced sexual activity: No  Other Topics Concern  . Not on file  Social History Narrative   She has a Scientist, water quality in Film/video editor.  He lives alone in a retirement community.  Her sister is Pharmacist, community.     Outpatient Medications Prior to Visit  Medication Sig Dispense Refill  . albuterol (VENTOLIN HFA) 108 (90 Base) MCG/ACT inhaler Inhale 2 puffs into the lungs every 6 (six) hours as needed.    . ALPRAZolam (XANAX) 0.25 MG tablet Take 1 tablet  (0.25 mg total) by mouth at bedtime as needed for sleep. 30 tablet 0  . AMBULATORY NON FORMULARY MEDICATION Take 5 mLs by mouth 2 (two) times daily. Swish 5 ML by mouth for 30 seconds and spit twice daily for two weeks  3  . Bevacizumab 2.75 MG/0.11ML SOSY Inject into the eye.    . calcium citrate-vitamin D (CITRACAL+D) 315-200 MG-UNIT tablet Take 1 tablet by mouth 2 (two) times daily.    . Flaxseed Oil OIL 1,000 mg.    . Javier Docker Oil 1000 MG CAPS Take 1,000 mg by mouth.    . latanoprost (XALATAN) 0.005 % ophthalmic solution Place 1 drop into both eyes daily.    Marland Kitchen lovastatin (MEVACOR) 40 MG tablet TAKE ONE TABLET BY MOUTH NIGHTLY 90 tablet 3  . Lysine 500 MG CAPS Take 1 capsule by mouth daily.    . meclizine (ANTIVERT) 25 MG tablet Take 1 tablet (25 mg total) by mouth 3 (three) times daily as needed for dizziness. 30 tablet 0  . Multiple Vitamins-Minerals (PRESERVISION AREDS 2) CAPS Take by mouth.    Marland Kitchen MYRBETRIQ 25 MG TB24 tablet Take 1 tablet (25 mg total) by mouth daily. 30 tablet 11  . omeprazole (PRILOSEC) 40 MG capsule Take 1 capsule (40 mg total) by mouth daily. 90 capsule 0  . simethicone (MYLICON) 0000000 MG chewable tablet Chew 125 mg by mouth every 6 (six) hours as needed for flatulence.    . sucralfate (CARAFATE) 1 g tablet TAKE 1 TABLET BY MOUTH  TWICE DAILY 180 tablet 3  . timolol (BETIMOL) 0.5 % ophthalmic solution Place 1 drop into the left eye 2 (two) times daily.    . TURMERIC PO Take 1,000 mg by mouth.    Marland Kitchen UNABLE TO FIND Right eye injection every 5-6 weeks for Macular Degeneration    . vitamin B-12 (CYANOCOBALAMIN) 1000 MCG tablet Take 1,000 mcg by mouth daily.      No facility-administered medications prior to visit.     Allergies  Allergen Reactions  . Aleve [Naproxen Sodium]     Causes elevation in cholesterol   . Azithromycin Rash    ROS Review of Systems    Objective:    Physical Exam  Constitutional: She is oriented to person, place, and time. She appears  well-developed and well-nourished.  HENT:  Head: Normocephalic and atraumatic.  Cardiovascular: Normal rate, regular rhythm and normal heart sounds.  Pulmonary/Chest: Effort normal and breath sounds normal.  Neurological: She is alert and oriented to person, place, and time.  Skin: Skin is warm and dry.  Psychiatric: She has a normal mood and affect. Her behavior is normal.    BP 122/66   Pulse 60   Ht 5\' 3"  (1.6 m)   Wt 191 lb (86.6 kg)   SpO2 95%   BMI 33.83 kg/m  Wt Readings from Last 3 Encounters:  08/08/19 191 lb (86.6 kg)  07/30/19 188 lb (85.3 kg)  07/25/18 186 lb (84.4 kg)     Health Maintenance Due  Topic Date Due  . PNA vac Low Risk Adult (1 of 2 - PCV13) 12/02/2002    There are no preventive care reminders to display for this patient.  No results found for: TSH No results found for: WBC, HGB, HCT, MCV, PLT Lab Results  Component Value Date   NA 141 07/30/2018   K 4.6 07/30/2018   CO2 28 07/30/2018   GLUCOSE 99 07/30/2018   BUN 9 07/30/2018   CREATININE 0.83 07/30/2018   BILITOT 0.8 07/30/2018   ALKPHOS 63 11/14/2016   AST 25 07/30/2018   ALT 8 07/30/2018   PROT 6.2 07/30/2018   CALCIUM 9.2 07/30/2018   Lab Results  Component Value Date   CHOL 163 07/30/2018   Lab Results  Component Value Date   HDL 44 (L) 07/30/2018   Lab Results  Component Value Date   LDLCALC 89 07/30/2018   Lab Results  Component Value Date   TRIG 205 (H) 07/30/2018   Lab Results  Component Value Date   CHOLHDL 3.7 07/30/2018   Lab Results  Component Value Date   HGBA1C 5.7 (A) 08/08/2019      Assessment & Plan:   Problem List Items Addressed This Visit      Endocrine   IFG (impaired fasting glucose) - Primary    A1c looks fantastic today.  Continue to work on healthy diet and regular exercise.  Follow-up in 6 months.      Relevant Orders   POCT HgB A1C (Completed)   COMPLETE METABOLIC PANEL WITH GFR   Lipid panel     Genitourinary   OAB  (overactive bladder)    Doing well with as needed Myrbetrek.      Relevant Orders   COMPLETE METABOLIC PANEL WITH GFR   Lipid panel     Other   Insomnia    Discussed with her today the risks of continue with alprazolam including dementia and increased risk for falls and fractures.  For now just recommend that she use it very sparingly and try to decrease the frequency of her use.       Other Visit Diagnoses    Wheezing       Relevant Orders   COMPLETE METABOLIC PANEL WITH GFR   Lipid panel     Wheezing-unclear etiology.  I wonder if it really is more just some mucus in her chest encouraged her to take a couple deep coughs when she notices the noise to see if it clears and goes away if not she can use her albuterol and see if that provides relief.  She really does not have any diagnosis to use albuterol so would like to investigate this further we will schedule her for spirometry next month for further work-up.  No orders of the defined types were placed in this encounter.   Follow-up: Return in about 4 weeks (around 09/05/2019) for Spirometry.Marland Kitchen  Beatrice Lecher, MD

## 2019-08-08 NOTE — Assessment & Plan Note (Signed)
Discussed with her today the risks of continue with alprazolam including dementia and increased risk for falls and fractures.  For now just recommend that she use it very sparingly and try to decrease the frequency of her use.

## 2019-08-08 NOTE — Assessment & Plan Note (Signed)
A1c looks fantastic today.  Continue to work on healthy diet and regular exercise.  Follow-up in 6 months.

## 2019-08-08 NOTE — Assessment & Plan Note (Signed)
Doing well with as needed Myrbetrek.

## 2019-08-09 DIAGNOSIS — N3281 Overactive bladder: Secondary | ICD-10-CM | POA: Diagnosis not present

## 2019-08-09 DIAGNOSIS — R062 Wheezing: Secondary | ICD-10-CM | POA: Diagnosis not present

## 2019-08-09 DIAGNOSIS — R7301 Impaired fasting glucose: Secondary | ICD-10-CM | POA: Diagnosis not present

## 2019-08-09 DIAGNOSIS — E782 Mixed hyperlipidemia: Secondary | ICD-10-CM | POA: Diagnosis not present

## 2019-08-10 LAB — LIPID PANEL
Cholesterol: 170 mg/dL (ref <200)
HDL: 57 mg/dL (ref >=50)
LDL Cholesterol (Calc): 91 mg/dL (calc)
Non-HDL Cholesterol (Calc): 113 mg/dL (calc) (ref <130)
Total CHOL/HDL Ratio: 3 (calc) (ref <5.0)
Triglycerides: 122 mg/dL (ref <150)

## 2019-08-10 LAB — COMPLETE METABOLIC PANEL WITH GFR
AG Ratio: 2.2 (calc) (ref 1.0–2.5)
ALT: 7 U/L (ref 6–29)
AST: 17 U/L (ref 10–35)
Albumin: 3.9 g/dL (ref 3.6–5.1)
Alkaline phosphatase (APISO): 50 U/L (ref 37–153)
BUN: 11 mg/dL (ref 7–25)
CO2: 28 mmol/L (ref 20–32)
Calcium: 9 mg/dL (ref 8.6–10.4)
Chloride: 106 mmol/L (ref 98–110)
Creat: 0.86 mg/dL (ref 0.60–0.88)
GFR, Est African American: 73 mL/min/{1.73_m2} (ref >=60)
GFR, Est Non African American: 63 mL/min/{1.73_m2} (ref >=60)
Globulin: 1.8 g/dL (calc) — ABNORMAL LOW (ref 1.9–3.7)
Glucose, Bld: 80 mg/dL (ref 65–99)
Potassium: 4 mmol/L (ref 3.5–5.3)
Sodium: 142 mmol/L (ref 135–146)
Total Bilirubin: 1.4 mg/dL — ABNORMAL HIGH (ref 0.2–1.2)
Total Protein: 5.7 g/dL — ABNORMAL LOW (ref 6.1–8.1)

## 2019-08-14 ENCOUNTER — Ambulatory Visit: Payer: Medicare Other

## 2019-08-15 ENCOUNTER — Ambulatory Visit (INDEPENDENT_AMBULATORY_CARE_PROVIDER_SITE_OTHER): Payer: Medicare Other

## 2019-08-15 ENCOUNTER — Other Ambulatory Visit: Payer: Self-pay

## 2019-08-15 DIAGNOSIS — Z1231 Encounter for screening mammogram for malignant neoplasm of breast: Secondary | ICD-10-CM | POA: Diagnosis not present

## 2019-08-21 ENCOUNTER — Other Ambulatory Visit: Payer: Self-pay | Admitting: Family Medicine

## 2019-09-02 DIAGNOSIS — L309 Dermatitis, unspecified: Secondary | ICD-10-CM | POA: Diagnosis not present

## 2019-09-02 DIAGNOSIS — Z85820 Personal history of malignant melanoma of skin: Secondary | ICD-10-CM | POA: Diagnosis not present

## 2019-09-02 DIAGNOSIS — D1801 Hemangioma of skin and subcutaneous tissue: Secondary | ICD-10-CM | POA: Diagnosis not present

## 2019-09-02 DIAGNOSIS — L821 Other seborrheic keratosis: Secondary | ICD-10-CM | POA: Diagnosis not present

## 2019-09-05 ENCOUNTER — Ambulatory Visit (INDEPENDENT_AMBULATORY_CARE_PROVIDER_SITE_OTHER): Payer: Medicare Other | Admitting: Family Medicine

## 2019-09-05 ENCOUNTER — Ambulatory Visit (INDEPENDENT_AMBULATORY_CARE_PROVIDER_SITE_OTHER): Payer: Medicare Other

## 2019-09-05 ENCOUNTER — Other Ambulatory Visit: Payer: Self-pay

## 2019-09-05 ENCOUNTER — Encounter: Payer: Self-pay | Admitting: Family Medicine

## 2019-09-05 VITALS — BP 154/93 | HR 98 | Ht 63.0 in | Wt 191.0 lb

## 2019-09-05 DIAGNOSIS — G8929 Other chronic pain: Secondary | ICD-10-CM

## 2019-09-05 DIAGNOSIS — M25512 Pain in left shoulder: Secondary | ICD-10-CM | POA: Insufficient documentation

## 2019-09-05 DIAGNOSIS — M545 Low back pain, unspecified: Secondary | ICD-10-CM

## 2019-09-05 DIAGNOSIS — R062 Wheezing: Secondary | ICD-10-CM

## 2019-09-05 NOTE — Assessment & Plan Note (Signed)
Most consistent with impingement versus bursitis.  Handout on home stretches and therapy to do on her own given.  Continue to alternate Advil and Tylenol since that does seem to help.  No x-ray indicated today with no recent injury fall etc.  If not improving in the next 3 to 4 weeks then recommend further work-up with one of our sports medicine docs, and possible imaging.

## 2019-09-05 NOTE — Patient Instructions (Signed)
If not better with the stretches over the next 3 weeks we will get you in with our sports med doc.

## 2019-09-05 NOTE — Progress Notes (Signed)
Established Patient Office Visit  Subjective:  Patient ID: Anna [name], female    DOB: 12/15/1935  Age: 83 y.o. MRN: [id number]  CC:  Chief Complaint  Patient presents with  . Wheezing    HPI Franklin Mcneish presents for left shoulder pain and low back pain.  She says her left shoulder has been bothering her for about 1-1/2 weeks.  Mostly on the top of the shoulder and then radiating down to that outer upper arm.  She denies any known trauma or injury.  She denies any old injuries that she is aware of.  She says sometimes when she reaches up or crawl she just feels a pulling sensation.  She says it is painful to sleep on that shoulder she has been alternating ice and Tylenol and it does seem to help some.  She also reports persistent low back pain that is been going on for several months at this point.  Again no known injury or trauma.  She is never really had major back problems before and has never had any prior imaging.  She is been using mostly heat and alternating Advil and Tylenol which does help some.  It does seem to be aggravated with prolonged walking.  It is bilateral across the lumbar spine and does not radiate into the legs or buttock cheek area.  She admits she has been a little bit more sedentary since Covid and wonders if that could be aggravating things.  Note, she was originally scheduled for spirometry today but had actually used her albuterol an hour prior to the appointment and so we have rescheduled that but she had some acute pain issues going on so we addressed the information above.  Past Medical History:  Diagnosis Date  . Dermatitis    seborrehic scalp  . Hiatal hernia   . History of basal cell carcinoma 07/20/2017   On nose.   Marland Kitchen History of melanoma 07/20/2017   Right upper arm   . Hyperlipidemia   . Macular degeneration     Past Surgical History:  Procedure Laterality Date  . BREAST CYST EXCISION Right 09/1962  . CATARACT EXTRACTION  03/2015  .  melonoma Right 06/2016   arm  . MOHS SURGERY Left 06/2016   nose    Family History  Problem Relation Age of Onset  . Hyperlipidemia Sister   . Macular degeneration Sister   . Cancer - Prostate Brother   . Atrial fibrillation Maternal Aunt   . Cancer Mother        pancreatic  . Cancer Father        liver    Social History   Socioeconomic History  . Marital status: Single    Spouse name: Not on file  . Number of children: Not on file  . Years of education: 12+  . Highest education level: Master's degree (e.g., MA, MS, MEng, MEd, MSW, MBA)  Occupational History  . 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  Tobacco Use  . Smoking status: Never Smoker  . Smokeless tobacco: Never Used  Substance and Sexual Activity  . Alcohol use: Yes    Alcohol/week: 1.0 standard drinks    Types: 1 Glasses of wine per week    Comment: occasionally  . Drug use: No  . Sexual activity: Never  Other Topics Concern  . Not on file  Social History Narrative   She has a Scientist, water quality in Film/video editor.  He lives alone in a retirement community.  Her sister is Pharmacist, community.    Social Determinants of Health   Financial Resource Strain: Low Risk   . Difficulty of Paying Living Expenses: Not hard at all  Food Insecurity: No Food Insecurity  . Worried About Charity fundraiser in the Last Year: Never true  . Ran Out of Food in the Last Year: Never true  Transportation Needs: No Transportation Needs  . Lack of Transportation (Medical): No  . Lack of Transportation (Non-Medical): No  Physical Activity: Inactive  . Days of Exercise per Week: 0 days  . Minutes of Exercise per Session: 0 min  Stress: No Stress Concern Present  . Feeling of Stress : Not at all  Social Connections: Slightly Isolated  . Frequency of Communication with Friends and Family: More than three times a week  . Frequency of Social Gatherings with Friends and  Family: Once a week  . Attends Religious Services: More than 4 times per year  . Active Member of Clubs or Organizations: Yes  . Attends Archivist Meetings: 1 to 4 times per year  . Marital Status: Never married  Intimate Partner Violence: Not At Risk  . Fear of Current or Ex-Partner: No  . Emotionally Abused: No  . Physically Abused: No  . Sexually Abused: No    Outpatient Medications Prior to Visit  Medication Sig Dispense Refill  . albuterol (VENTOLIN HFA) 108 (90 Base) MCG/ACT inhaler Inhale 2 puffs into the lungs 2 (two) times daily.     Marland Kitchen ALPRAZolam (XANAX) 0.25 MG tablet Take 1 tablet (0.25 mg total) by mouth at bedtime as needed for sleep. 30 tablet 0  . AMBULATORY NON FORMULARY MEDICATION Take 5 mLs by mouth 2 (two) times daily. Swish 5 ML by mouth for 30 seconds and spit twice daily for two weeks  3  . Bevacizumab 2.75 MG/0.11ML SOSY Inject into the eye.    . calcium citrate-vitamin D (CITRACAL+D) 315-200 MG-UNIT tablet Take 1 tablet by mouth 2 (two) times daily.    . Flaxseed Oil OIL 1,000 mg.    . Javier Docker Oil 1000 MG CAPS Take 1,000 mg by mouth.    . latanoprost (XALATAN) 0.005 % ophthalmic solution Place 1 drop into both eyes daily.    Marland Kitchen lovastatin (MEVACOR) 40 MG tablet TAKE ONE TABLET BY MOUTH NIGHTLY 90 tablet 3  . Lysine 500 MG CAPS Take 1 capsule by mouth daily.    . meclizine (ANTIVERT) 25 MG tablet Take 1 tablet (25 mg total) by mouth 3 (three) times daily as needed for dizziness. 30 tablet 0  . Multiple Vitamins-Minerals (PRESERVISION AREDS 2) CAPS Take by mouth.    Marland Kitchen MYRBETRIQ 25 MG TB24 tablet Take 1 tablet (25 mg total) by mouth daily. 30 tablet 11  . omeprazole (PRILOSEC) 40 MG capsule Take 1 capsule (40 mg total) by mouth daily. 90 capsule 0  . simethicone (MYLICON) 0000000 MG chewable tablet Chew 125 mg by mouth every 6 (six) hours as needed for flatulence.    . sucralfate (CARAFATE) 1 g tablet TAKE 1 TABLET BY MOUTH  TWICE DAILY 180 tablet 3  . timolol  (BETIMOL) 0.5 % ophthalmic solution Place 1 drop into the left eye 2 (two) times daily.    . TURMERIC PO Take 1,000 mg by mouth.    Marland Kitchen UNABLE TO FIND Right eye injection every 5-6 weeks for Macular Degeneration    . vitamin B-12 (CYANOCOBALAMIN) 1000 MCG tablet  Take 1,000 mcg by mouth daily.      No facility-administered medications prior to visit.    Allergies  Allergen Reactions  . Aleve [Naproxen Sodium]     Causes elevation in cholesterol   . Azithromycin Rash    ROS Review of Systems    Objective:    Physical Exam  Constitutional: She is oriented to person, place, and time. She appears well-developed and well-nourished.  HENT:  Head: Normocephalic and atraumatic.  Eyes: Conjunctivae and EOM are normal.  Cardiovascular: Normal rate.  Pulmonary/Chest: Effort normal.  Musculoskeletal:     Comments: Left shoulder with normal range of motion and strength in all directions.  Normal external rotation.  Normal internal rotation.  She did have some pain with Hawkins test.  A little bit of tenderness directly over the acromium and AC joint.  Lumbar spine with normal flexion, extension, rotation, side bending.  Negative straight leg raise bilaterally.  Hip, knee, ankle strength is 5-5 bilaterally.  Patellar reflex 1+ on the left and 0 on the right.  Very tender over both SI joints.  Neurological: She is alert and oriented to person, place, and time.  Skin: Skin is dry. No pallor.  Psychiatric: She has a normal mood and affect. Her behavior is normal.  Vitals reviewed.   BP (!) 154/93   Pulse 98   Ht 5\' 3"  (1.6 m)   Wt 191 lb (86.6 kg)   SpO2 95%   BMI 33.83 kg/m  Wt Readings from Last 3 Encounters:  09/05/19 191 lb (86.6 kg)  08/08/19 191 lb (86.6 kg)  07/30/19 188 lb (85.3 kg)     Health Maintenance Due  Topic Date Due  . PNA vac Low Risk Adult (1 of 2 - PCV13) 12/02/2002    There are no preventive care reminders to display for this patient.  No results found for:  TSH No results found for: WBC, HGB, HCT, MCV, PLT Lab Results  Component Value Date   NA 142 08/09/2019   K 4.0 08/09/2019   CO2 28 08/09/2019   GLUCOSE 80 08/09/2019   BUN 11 08/09/2019   CREATININE 0.86 08/09/2019   BILITOT 1.4 (H) 08/09/2019   ALKPHOS 63 11/14/2016   AST 17 08/09/2019   ALT 7 08/09/2019   PROT 5.7 (L) 08/09/2019   CALCIUM 9.0 08/09/2019   Lab Results  Component Value Date   CHOL 170 08/09/2019   Lab Results  Component Value Date   HDL 57 08/09/2019   Lab Results  Component Value Date   LDLCALC 91 08/09/2019   Lab Results  Component Value Date   TRIG 122 08/09/2019   Lab Results  Component Value Date   CHOLHDL 3.0 08/09/2019   Lab Results  Component Value Date   HGBA1C 5.7 (A) 08/08/2019      Assessment & Plan:   Problem List Items Addressed This Visit      Other   Chronic bilateral low back pain without sciatica    This been going on for several months with new onset recommend x-ray based on her age and she is high risk.  Call with results once available.  Did go ahead and give her a handout today on exercises to do for her low back and SI joints.  If not improving over the next 3 to 4 weeks and consider formal physical therapy.      Relevant Orders   DG Lumbar Spine Complete   Acute pain of left shoulder  Most consistent with impingement versus bursitis.  Handout on home stretches and therapy to do on her own given.  Continue to alternate Advil and Tylenol since that does seem to help.  No x-ray indicated today with no recent injury fall etc.  If not improving in the next 3 to 4 weeks then recommend further work-up with one of our sports medicine docs, and possible imaging.       Other Visit Diagnoses    Wheezing    -  Primary      Wheezing-plan to reschedule spirometry.  No orders of the defined types were placed in this encounter.   Follow-up: Return if symptoms worsen or fail to improve.    Beatrice Lecher, MD

## 2019-09-05 NOTE — Assessment & Plan Note (Signed)
This been going on for several months with new onset recommend x-ray based on her age and she is high risk.  Call with results once available.  Did go ahead and give her a handout today on exercises to do for her low back and SI joints.  If not improving over the next 3 to 4 weeks and consider formal physical therapy.

## 2019-09-05 NOTE — Progress Notes (Signed)
Spirometry attempted. Patient reports she used albuterol inhaler at 9:30 am. Will have to reschedule spirometry. Still wants to see Dr. Madilyn Fireman to discuss arm/leg pain.

## 2019-09-17 ENCOUNTER — Ambulatory Visit: Payer: Medicare Other | Admitting: Family Medicine

## 2019-09-24 ENCOUNTER — Other Ambulatory Visit: Payer: Self-pay

## 2019-09-24 ENCOUNTER — Ambulatory Visit (INDEPENDENT_AMBULATORY_CARE_PROVIDER_SITE_OTHER): Payer: Medicare Other | Admitting: Family Medicine

## 2019-09-24 VITALS — BP 143/87 | HR 75 | Temp 98.0°F | Ht 63.0 in | Wt 191.0 lb

## 2019-09-24 DIAGNOSIS — J439 Emphysema, unspecified: Secondary | ICD-10-CM | POA: Diagnosis not present

## 2019-09-24 DIAGNOSIS — J984 Other disorders of lung: Secondary | ICD-10-CM

## 2019-09-24 DIAGNOSIS — R0600 Dyspnea, unspecified: Secondary | ICD-10-CM

## 2019-09-24 DIAGNOSIS — R062 Wheezing: Secondary | ICD-10-CM | POA: Diagnosis not present

## 2019-09-24 DIAGNOSIS — J4540 Moderate persistent asthma, uncomplicated: Secondary | ICD-10-CM

## 2019-09-24 MED ORDER — FLUTICASONE-SALMETEROL 250-50 MCG/DOSE IN AEPB: 1.0000 | INHALATION_SPRAY | Freq: Two times a day (BID) | RESPIRATORY_TRACT | 5 refills | Status: DC

## 2019-09-24 MED ORDER — ALBUTEROL SULFATE HFA 108 (90 BASE) MCG/ACT IN AERS
2.0000 | INHALATION_SPRAY | Freq: Once | RESPIRATORY_TRACT | Status: AC
  Administered 2019-09-24: 2 via RESPIRATORY_TRACT

## 2019-09-24 NOTE — Progress Notes (Signed)
Patient presents to clinic for a Spirometry evaluation. She reports she had some shortness of breath in the past and has had some wheezing. She had to complete the pre-test twice and then she completed the post test. The test was handed to the PCP.   She tolerated the Albuterol inhaler well with no immediate complications in the office. The remaining box was given to the patient.

## 2019-09-24 NOTE — Progress Notes (Signed)
Established Patient Office Visit  Subjective:  Patient ID: Anna [name], female    DOB: 03-Apr-1936  Age: 83 y.o. MRN: [id number]  CC: No chief complaint on file.   HPI Anna [name] presents for intermittent wheezing.  She previously reported at office visit in November that she was sneezing mainly in the evenings when she would get ready to go to bed and sometimes occasionally in the mornings.  She had been given albuterol at one point though she denies prior diagnosis of COPD or asthma.  Is says that she has been using that pretty regularly as it did seem to help.  She brought in an older proair inhaler today.  Is reported long history of getting bronchitis almost yearly.  She has never been a smoker and denies significant exposure to secondhand smoke.  She is never worked in a factory or been exposed to other chemicals or fibers.  She does sing and says that she is noted she is not able to hold a note as long as she used to.  She is here today to do spirometry.   Past Medical History:  Diagnosis Date  . Dermatitis    seborrehic scalp  . Hiatal hernia   . History of basal cell carcinoma 07/20/2017   On nose.   Marland Kitchen History of melanoma 07/20/2017   Right upper arm   . Hyperlipidemia   . Macular degeneration     Past Surgical History:  Procedure Laterality Date  . BREAST CYST EXCISION Right 09/1962  . CATARACT EXTRACTION  03/2015  . melonoma Right 06/2016   arm  . MOHS SURGERY Left 06/2016   nose    Family History  Problem Relation Age of Onset  . Hyperlipidemia Sister   . Macular degeneration Sister   . Cancer - Prostate Brother   . Atrial fibrillation Maternal Aunt   . Cancer Mother        pancreatic  . Cancer Father        liver    Social History   Socioeconomic History  . Marital status: Single    Spouse name: Not on file  . Number of children: Not on file  . Years of education: 12+  . Highest education level: Master's degree (e.g., MA, MS, MEng, MEd,  MSW, MBA)  Occupational History  . 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  Tobacco Use  . Smoking status: Never Smoker  . Smokeless tobacco: Never Used  Substance and Sexual Activity  . Alcohol use: Yes    Alcohol/week: 1.0 standard drinks    Types: 1 Glasses of wine per week    Comment: occasionally  . Drug use: No  . Sexual activity: Never  Other Topics Concern  . Not on file  Social History Narrative   She has a Scientist, water quality in Film/video editor.  He lives alone in a retirement community.  Her sister is Pharmacist, community.    Social Determinants of Health   Financial Resource Strain:   . Difficulty of Paying Living Expenses: Not on file  Food Insecurity:   . Worried About Charity fundraiser in the Last Year: Not on file  . Ran Out of Food in the Last Year: Not on file  Transportation Needs:   . Lack of Transportation (Medical): Not on file  . Lack of Transportation (Non-Medical): Not on file  Physical Activity: Inactive  . Days of Exercise per Week: 0 days  .  Minutes of Exercise per Session: 0 min  Stress:   . Feeling of Stress : Not on file  Social Connections: Unknown  . Frequency of Communication with Friends and Family: Not on file  . Frequency of Social Gatherings with Friends and Family: Once a week  . Attends Religious Services: Not on file  . Active Member of Clubs or Organizations: Not on file  . Attends Archivist Meetings: Not on file  . Marital Status: Not on file  Intimate Partner Violence:   . Fear of Current or Ex-Partner: Not on file  . Emotionally Abused: Not on file  . Physically Abused: Not on file  . Sexually Abused: Not on file    Outpatient Medications Prior to Visit  Medication Sig Dispense Refill  . albuterol (VENTOLIN HFA) 108 (90 Base) MCG/ACT inhaler Inhale 2 puffs into the lungs 2 (two) times daily.     Marland Kitchen ALPRAZolam (XANAX) 0.25 MG tablet Take 1 tablet (0.25 mg total)  by mouth at bedtime as needed for sleep. 30 tablet 0  . AMBULATORY NON FORMULARY MEDICATION Take 5 mLs by mouth 2 (two) times daily. Swish 5 ML by mouth for 30 seconds and spit twice daily for two weeks  3  . Bevacizumab 2.75 MG/0.11ML SOSY Inject into the eye.    . calcium citrate-vitamin D (CITRACAL+D) 315-200 MG-UNIT tablet Take 1 tablet by mouth 2 (two) times daily.    . Flaxseed Oil OIL 1,000 mg.    . Javier Docker Oil 1000 MG CAPS Take 1,000 mg by mouth.    . latanoprost (XALATAN) 0.005 % ophthalmic solution Place 1 drop into both eyes daily.    Marland Kitchen lovastatin (MEVACOR) 40 MG tablet TAKE ONE TABLET BY MOUTH NIGHTLY 90 tablet 3  . Lysine 500 MG CAPS Take 1 capsule by mouth daily.    . meclizine (ANTIVERT) 25 MG tablet Take 1 tablet (25 mg total) by mouth 3 (three) times daily as needed for dizziness. 30 tablet 0  . Multiple Vitamins-Minerals (PRESERVISION AREDS 2) CAPS Take by mouth.    Marland Kitchen MYRBETRIQ 25 MG TB24 tablet Take 1 tablet (25 mg total) by mouth daily. 30 tablet 11  . omeprazole (PRILOSEC) 40 MG capsule Take 1 capsule (40 mg total) by mouth daily. 90 capsule 0  . simethicone (MYLICON) 0000000 MG chewable tablet Chew 125 mg by mouth every 6 (six) hours as needed for flatulence.    . sucralfate (CARAFATE) 1 g tablet TAKE 1 TABLET BY MOUTH  TWICE DAILY 180 tablet 3  . timolol (BETIMOL) 0.5 % ophthalmic solution Place 1 drop into the left eye 2 (two) times daily.    . TURMERIC PO Take 1,000 mg by mouth.    Marland Kitchen UNABLE TO FIND Right eye injection every 5-6 weeks for Macular Degeneration    . vitamin B-12 (CYANOCOBALAMIN) 1000 MCG tablet Take 1,000 mcg by mouth daily.      No facility-administered medications prior to visit.    Allergies  Allergen Reactions  . Aleve [Naproxen Sodium]     Causes elevation in cholesterol   . Azithromycin Rash    ROS Review of Systems    Objective:    Physical Exam  Constitutional: She is oriented to person, place, and time. She appears well-developed and  well-nourished.  HENT:  Head: Normocephalic and atraumatic.  Cardiovascular: Normal rate, regular rhythm and normal heart sounds.  Pulmonary/Chest: Effort normal and breath sounds normal.  Neurological: She is alert and oriented to person, place, and time.  Skin: Skin is warm and dry.  Psychiatric: She has a normal mood and affect. Her behavior is normal.    BP (!) 143/87   Pulse 75   Temp 98 F (36.7 C) (Oral)   Ht 5\' 3"  (1.6 m)   Wt 191 lb (86.6 kg)   BMI 33.83 kg/m  Wt Readings from Last 3 Encounters:  09/24/19 191 lb (86.6 kg)  09/05/19 191 lb (86.6 kg)  08/08/19 191 lb (86.6 kg)     There are no preventive care reminders to display for this patient.  There are no preventive care reminders to display for this patient.  No results found for: TSH No results found for: WBC, HGB, HCT, MCV, PLT Lab Results  Component Value Date   NA 142 08/09/2019   K 4.0 08/09/2019   CO2 28 08/09/2019   GLUCOSE 80 08/09/2019   BUN 11 08/09/2019   CREATININE 0.86 08/09/2019   BILITOT 1.4 (H) 08/09/2019   ALKPHOS 63 11/14/2016   AST 17 08/09/2019   ALT 7 08/09/2019   PROT 5.7 (L) 08/09/2019   CALCIUM 9.0 08/09/2019   Lab Results  Component Value Date   CHOL 170 08/09/2019   Lab Results  Component Value Date   HDL 57 08/09/2019   Lab Results  Component Value Date   LDLCALC 91 08/09/2019   Lab Results  Component Value Date   TRIG 122 08/09/2019   Lab Results  Component Value Date   CHOLHDL 3.0 08/09/2019   Lab Results  Component Value Date   HGBA1C 5.7 (A) 08/08/2019      Assessment & Plan:   Problem List Items Addressed This Visit    None    Visit Diagnoses    Wheezing    -  Primary   Relevant Medications   albuterol (VENTOLIN HFA) 108 (90 Base) MCG/ACT inhaler 2 puff (Completed)   Other Relevant Orders   PR EVAL OF BRONCHOSPASM   Dyspnea, unspecified type       Relevant Medications   albuterol (VENTOLIN HFA) 108 (90 Base) MCG/ACT inhaler 2 puff  (Completed)   Other Relevant Orders   PR EVAL OF BRONCHOSPASM   Mixed restrictive and obstructive lung disease (HCC)       Relevant Medications   albuterol (VENTOLIN HFA) 108 (90 Base) MCG/ACT inhaler 2 puff (Completed)   Fluticasone-Salmeterol (ADVAIR) 250-50 MCG/DOSE AEPB   Moderate persistent asthma in adult without complication       Relevant Medications   albuterol (VENTOLIN HFA) 108 (90 Base) MCG/ACT inhaler 2 puff (Completed)   Fluticasone-Salmeterol (ADVAIR) 250-50 MCG/DOSE AEPB      Spirometry today shows FVC of 73% with an FEV1 of 90% and a ratio of 91.  But interestingly she had 14% improvement in FEV1 after albuterol and 40% improvement in FEF 25-75 after albuterol.  Reviewed the information with her.  She does have some component of restriction but does seem to respond to albuterol like she has underlying asthma.  I do think she has adult onset asthma.  She has been using her albuterol once or twice daily.  We will get a switch her to generic Advair for now.  She will use twice a day and then plan to follow-up in 2 months.  If she is doing well at that point we may be able to stepdown her therapy to just as an inhaled corticosteroid.  Discussed that I want her to only use her albuterol if she is feeling symptomatic with shortness of  breath and/or wheezing.  Her flu vaccine is up-to-date.  Meds ordered this encounter  Medications  . albuterol (VENTOLIN HFA) 108 (90 Base) MCG/ACT inhaler 2 puff  . Fluticasone-Salmeterol (ADVAIR) 250-50 MCG/DOSE AEPB    Sig: Inhale 1 puff into the lungs 2 (two) times daily.    Dispense:  60 each    Refill:  5    Follow-up: Return in about 2 months (around 11/24/2019) for recheck lungs.    Beatrice Lecher, MD

## 2019-09-26 ENCOUNTER — Other Ambulatory Visit: Payer: Self-pay

## 2019-09-26 ENCOUNTER — Ambulatory Visit (INDEPENDENT_AMBULATORY_CARE_PROVIDER_SITE_OTHER): Payer: Medicare Other | Admitting: Sports Medicine

## 2019-09-26 ENCOUNTER — Ambulatory Visit (INDEPENDENT_AMBULATORY_CARE_PROVIDER_SITE_OTHER): Payer: Medicare Other

## 2019-09-26 ENCOUNTER — Encounter: Payer: Self-pay | Admitting: Sports Medicine

## 2019-09-26 DIAGNOSIS — M25512 Pain in left shoulder: Secondary | ICD-10-CM | POA: Diagnosis not present

## 2019-09-26 DIAGNOSIS — M545 Low back pain, unspecified: Secondary | ICD-10-CM

## 2019-09-26 DIAGNOSIS — G8929 Other chronic pain: Secondary | ICD-10-CM | POA: Diagnosis not present

## 2019-09-26 DIAGNOSIS — M19012 Primary osteoarthritis, left shoulder: Secondary | ICD-10-CM | POA: Diagnosis not present

## 2019-09-26 NOTE — Assessment & Plan Note (Signed)
Subacromial bursitis. Injection as above, formal PT, adding x-rays. Return to see me in a month.

## 2019-09-26 NOTE — Assessment & Plan Note (Signed)
Low back pain, consistent with lumbar spinal stenosis, better with forward flexion. Adding PT for this as well. Return to see me in a month, MRI and epidurals if no better.

## 2019-09-26 NOTE — Progress Notes (Signed)
Subjective:    CC: Back pain, shoulder pain  HPI: Anna [name] is a very pleasant 83 year old female, for the past month or so she is had pain in her left shoulder, worse over the deltoid and with overhead activities, keeping her up at night, moderate, persistent.  She was seen by Dr. Charise Carwin who referred her to me for further evaluation and definitive treatment.  In addition she has low back pain, axial, worse with standing, walking, better with leaning forward, no bowel or bladder dysfunction, saddle numbness, constitutional symptoms, nothing overtly radicular.  I reviewed the past medical history, family history, social history, surgical history, and allergies today and no changes were needed.  Please see the problem list section below in epic for further details.  Past Medical History: Past Medical History:  Diagnosis Date  . Dermatitis    seborrehic scalp  . Hiatal hernia   . History of basal cell carcinoma 07/20/2017   On nose.   Marland Kitchen History of melanoma 07/20/2017   Right upper arm   . Hyperlipidemia   . Macular degeneration    Past Surgical History: Past Surgical History:  Procedure Laterality Date  . BREAST CYST EXCISION Right 09/1962  . CATARACT EXTRACTION  03/2015  . melonoma Right 06/2016   arm  . MOHS SURGERY Left 06/2016   nose   Social History: Social History   Socioeconomic History  . Marital status: Single    Spouse name: Not on file  . Number of children: Not on file  . Years of education: 12+  . Highest education level: Master's degree (e.g., MA, MS, MEng, MEd, MSW, MBA)  Occupational History  . 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  Tobacco Use  . Smoking status: Never Smoker  . Smokeless tobacco: Never Used  Substance and Sexual Activity  . Alcohol use: Yes    Alcohol/week: 1.0 standard drinks    Types: 1 Glasses of wine per week    Comment: occasionally  . Drug use: No  . Sexual  activity: Never  Other Topics Concern  . Not on file  Social History Narrative   She has a Scientist, water quality in Film/video editor.  He lives alone in a retirement community.  Her sister is Pharmacist, community.    Social Determinants of Health   Financial Resource Strain:   . Difficulty of Paying Living Expenses: Not on file  Food Insecurity:   . Worried About Charity fundraiser in the Last Year: Not on file  . Ran Out of Food in the Last Year: Not on file  Transportation Needs:   . Lack of Transportation (Medical): Not on file  . Lack of Transportation (Non-Medical): Not on file  Physical Activity: Inactive  . Days of Exercise per Week: 0 days  . Minutes of Exercise per Session: 0 min  Stress:   . Feeling of Stress : Not on file  Social Connections: Unknown  . Frequency of Communication with Friends and Family: Not on file  . Frequency of Social Gatherings with Friends and Family: Once a week  . Attends Religious Services: Not on file  . Active Member of Clubs or Organizations: Not on file  . Attends Archivist Meetings: Not on file  . Marital Status: Not on file   Family History: Family History  Problem Relation Age of Onset  . Hyperlipidemia Sister   . Macular degeneration Sister   . Cancer - Prostate Brother   .  Atrial fibrillation Maternal Aunt   . Cancer Mother        pancreatic  . Cancer Father        liver   Allergies: Allergies  Allergen Reactions  . Aleve [Naproxen Sodium]     Causes elevation in cholesterol   . Azithromycin Rash   Medications: See med rec.  Review of Systems: No fevers, chills, night sweats, weight loss, chest pain, or shortness of breath.   Objective:    General: Well Developed, well nourished, and in no acute distress.  Neuro: Alert and oriented x3, extra-ocular muscles intact, sensation grossly intact.  HEENT: Normocephalic, atraumatic, pupils equal round reactive to light, neck supple, no masses, no lymphadenopathy, thyroid  nonpalpable.  Skin: Warm and dry, no rashes. Cardiac: Regular rate and rhythm, no murmurs rubs or gallops, no lower extremity edema.  Respiratory: Clear to auscultation bilaterally. Not using accessory muscles, speaking in full sentences. Left shoulder: Inspection reveals no abnormalities, atrophy or asymmetry. Palpation is normal with no tenderness over AC joint or bicipital groove. ROM is full in all planes. Weak to abduction Positive Neer and Hawkin's tests, empty can. Speeds and Yergason's tests normal. No labral pathology noted with negative Obrien's, negative crank, negative clunk, and good stability. Normal scapular function observed. No painful arc and no drop arm sign. No apprehension sign  Procedure: Real-time Ultrasound Guided injection of the left subacromial bursa Device: Samsung HS60  Verbal informed consent obtained.  Time-out conducted.  Noted no overlying erythema, induration, or other signs of local infection.  Skin prepped in a sterile fashion.  Local anesthesia: Topical Ethyl chloride.  With sterile technique and under real time ultrasound guidance: 1 cc Kenalog 40, 1 cc lidocaine,1 cc bupivacaine injected easily Completed without difficulty  Pain immediately resolved suggesting accurate placement of the medication.  Advised to call if fevers/chills, erythema, induration, drainage, or persistent bleeding.  Images permanently stored and available for review in the ultrasound unit.  Impression: Technically successful ultrasound guided injection.  Impression and Recommendations:    Acute pain of left shoulder Subacromial bursitis. Injection as above, formal PT, adding x-rays. Return to see me in a month.  Chronic bilateral low back pain without sciatica Low back pain, consistent with lumbar spinal stenosis, better with forward flexion. Adding PT for this as well. Return to see me in a month, MRI and epidurals if no better.    ___________________________________________ Gwen Her. Dianah Field, M.D., ABFM., CAQSM. Primary Care and Sports Medicine  [facility]  Adjunct Professor of [employer] [facility] [employer] [facility] [employer]

## 2019-09-30 ENCOUNTER — Other Ambulatory Visit: Payer: Self-pay

## 2019-09-30 ENCOUNTER — Encounter: Payer: Self-pay | Admitting: Physical Therapy

## 2019-09-30 ENCOUNTER — Ambulatory Visit (INDEPENDENT_AMBULATORY_CARE_PROVIDER_SITE_OTHER): Payer: Medicare Other | Admitting: Physical Therapy

## 2019-09-30 DIAGNOSIS — M6281 Muscle weakness (generalized): Secondary | ICD-10-CM | POA: Diagnosis not present

## 2019-09-30 DIAGNOSIS — M25512 Pain in left shoulder: Secondary | ICD-10-CM

## 2019-09-30 DIAGNOSIS — M545 Low back pain, unspecified: Secondary | ICD-10-CM

## 2019-09-30 NOTE — Therapy (Signed)
[place], [place], [address] Phone: [phone]   Fax:  [phone]  Physical Therapy Evaluation  Patient Details  Name: Anna [name] MRN: [id number] Date of Birth: 01-Sep-1936 Referring Provider (PT): Dr. Dianah Field   Encounter Date: 09/30/2019  PT End of Session - 09/30/19 1101    Visit Number  1    Number of Visits  16    Date for PT Re-Evaluation  11/28/19    Authorization Type  UHC    PT Start Time  [id number]    PT Stop Time  1135    PT Time Calculation (min)  50 min    Activity Tolerance  Patient tolerated treatment well    Behavior During Therapy  [facility] for tasks assessed/performed       Past Medical History:  Diagnosis Date  . Dermatitis    seborrehic scalp  . Hiatal hernia   . History of basal cell carcinoma 07/20/2017   On nose.   Marland Kitchen History of melanoma 07/20/2017   Right upper arm   . Hyperlipidemia   . Macular degeneration     Past Surgical History:  Procedure Laterality Date  . BREAST CYST EXCISION Right 09/1962  . CATARACT EXTRACTION  03/2015  . melonoma Right 06/2016   arm  . MOHS SURGERY Left 06/2016   nose    There were no vitals filed for this visit.   Subjective Assessment - 09/30/19 1053    Subjective  Pt reporting L shoulder pain which has been going on for about a month since beginning of December. Pt reporting 0/10 at present following her injection on 09/26/2019. Pt reporting her shoulder is doing much better since injection with pain noted only with certain motions. Pt reporting low back pain of 5-6/10 which has been going on for several months. Pt reporting this past week it has worsened. Pt reporting she took down her Christmas decorations which may have aggrivated it. Pt has been taking over the counter meds (tylenol and advil) and using a heating pad to help.    Limitations  Walking;Standing    Patient Stated Goals  Walk with out pain.    Currently in Pain?   Yes    Pain Score  6     Pain Location  Back    Pain Orientation  Right;Left;Lower    Pain Descriptors / Indicators  Sharp    Pain Type  Acute pain    Pain Radiating Towards  more on R side but it doesn't radiate down the leg    Pain Onset  More than a month ago    Pain Frequency  Intermittent    Aggravating Factors   walking, standing prolonged    Pain Relieving Factors  lying down, changing positions, over the counter pain meds, heat    Effect of Pain on Daily Activities  difficutly standing in shower and needs to rest afterward,    Multiple Pain Sites  Yes    Pain Score  0    Pain Location  Shoulder    Pain Orientation  Left    Pain Descriptors / Indicators  Aching    Pain Type  Acute pain    Pain Onset  More than a month ago    Pain Frequency  Intermittent    Aggravating Factors   reaching toward bra stap    Pain Relieving Factors  the injection has helped         [facility] PT Assessment -  09/30/19 0001      Assessment   Medical Diagnosis  Bilateral LBP, M54.5, L shoulder pain     Referring Provider (PT)  Dr. Dianah Field    Hand Dominance  Right    Prior Therapy  yes, 5 years ago      Precautions   Precautions  None      Restrictions   Weight Bearing Restrictions  No      Balance Screen   Has the patient fallen in the past 6 months  No   pt reporting 2 major falls 4 years ago, but nothing recent   Is the patient reluctant to leave their home because of a fear of falling?   No      Home Environment   Living Environment  Private residence    Living Arrangements  Alone    Type of [place]  --   2nd floor apartment with [place] bars - toilet      Prior Function   Level of Independence  Independent      Cognition   Overall Cognitive Status  Within Functional Limits for tasks assessed      Observation/Other Assessments   Focus on Therapeutic Outcomes (FOTO)   62% limitation       Posture/Postural Control   Posture/Postural Control  Postural limitations    Postural Limitations  Rounded Shoulders;Forward head;Increased lumbar lordosis;Increased thoracic kyphosis      ROM / Strength   AROM / PROM / Strength  AROM;Strength      AROM   AROM Assessment Site  Shoulder;Lumbar    Right/Left Shoulder  Right;Left    Right Shoulder Extension  45 Degrees    Right Shoulder Flexion  162 Degrees    Right Shoulder Internal Rotation  --   thumb to T 10    Right Shoulder External Rotation  60 Degrees    Right Shoulder Horizontal ABduction  120 Degrees    Left Shoulder Flexion  158 Degrees    Left Shoulder ABduction  45 Degrees    Left Shoulder External Rotation  --   thumb to T10 with pain noted in anterior bicep   Left Shoulder Horizontal ABduction  115 Degrees    Lumbar Flexion  65   R hip pulling senstaion and hamstring tightness   Lumbar Extension  38    Lumbar - Right Side Bend  18, R hip pain noted    Lumbar - Left Side Bend  22    Lumbar - Right Rotation  25% limitation with pain noted in R hip    Lumbar - Left Rotation  25% limitation       Strength   Strength Assessment Site  Shoulder;Hip    Right/Left Shoulder  Right;Left    Right Shoulder Flexion  4/5    Right Shoulder Extension  4/5    Right Shoulder External Rotation  4/5    Left Shoulder Flexion  4/5    Left Shoulder Internal Rotation  3/5    Left Shoulder External Rotation  3/5   pain noted   Right/Left Hip  Right;Left    Right Hip Flexion  4/5    Right Hip ABduction  4/5    Right Hip ADduction  4/5    Left Hip Flexion  4/5    Left Hip ABduction  4/5    Left Hip ADduction  4/5  Flexibility   Soft Tissue Assessment /Muscle Length  yes    Hamstrings  L: 65 degrees only tightness noted in back of leg, R:75 degrees,    tested with opposite leg straight     Palpation   Palpation comment  TTP along bilateral posterio innominate and lumbar paraspinals      Special Tests    Special Tests   Lumbar    Lumbar Tests  Straight Leg Raise      Straight Leg Raise   Findings  Negative    Comment  negative bilaterally      Bed Mobility   Bed Mobility  --   min assistance supine to sit from mat table     Transfers   Five time sit to stand comments   38 seconds with SOB noted and using bilateral UE's      High Level Balance   High Level Balance Comments  R SLS: 3 seconds with lateral sway, L SLS: 2 seconds with lateral sway                Objective measurements completed on examination: See above findings.              PT Education - 09/30/19 1100    Education Details  PT POC, HEP    Person(s) Educated  Patient    Methods  Explanation;Demonstration    Comprehension  Verbalized understanding;Returned demonstration          PT Long Term Goals - 09/30/19 1156      PT LONG TERM GOAL #1   Title  Pt will be independent in her HEP and progression.    Baseline  initial program given today.    Time  6    Period  Weeks    Status  New    Target Date  11/12/19      PT LONG TERM GOAL #2   Title  Pt will improve her hamstring flexibility to >/= 70 degrees bilaterally.    Time  6    Period  Weeks    Status  New    Target Date  11/12/19      PT LONG TERM GOAL #3   Title  Pt will improve her bilateral hip strength to >/= 4+/5.    Baseline  see flow sheet    Time  6    Period  Weeks    Status  New    Target Date  11/12/19      PT LONG TERM GOAL #4   Title  Pt will improve her 5 time sit to stand to </= 20 seconds using UE support as needed.    Baseline  38 seconds with UE support    Time  6    Period  Weeks    Status  New    Target Date  11/12/19      PT LONG TERM GOAL #5   Title  Pt will be able to report pain </= 2/10 with walking for 15 minutes.    Baseline  walking hurts    Time  6    Period  Weeks    Status  New    Target Date  11/12/19             Plan - 09/30/19 1143    Clinical Impression Statement  Pt presenting with  bilateral low back pain and L shoulder pain. Pt with 6/10 pain reported in her low back today and no pain in  L shoulder at rest. Pt's pain did increase with resisted L shoulder IR and ER. Pt with limited shoulder strength and hip strength. Pt also with limited bed mobility and required assistance from supine to sit from the mat table. Pt requring 38 seconds to perform 5 times sit to stand using bilateral UE's for push off. Pt with tightness in bilateral hamstrings with tenderness noted in lumbar paraspinals and SI joint. Skilled PT needed to address pt's impairments with the below interventions.    Personal Factors and Comorbidities  Age;Comorbidity 3+    Comorbidities  chronic fatigue, arthritis, macular degeneration, glaucoma, bicep tendonitis bilateral shoulders, melanoma, basel cell carcinoma, low back pain    Examination-Activity Limitations  Other;Transfers;Carry;Reach Overhead;Lift;Bed Mobility;Stand    Examination-Participation Restrictions  Driving;Community Activity;Laundry;Cleaning    Stability/Clinical Decision Making  Stable/Uncomplicated    Clinical Decision Making  Moderate    Rehab Potential  Good    PT Frequency  2x / week    PT Duration  8 weeks    PT Treatment/Interventions  ADLs/Self Care Home Management;Cryotherapy;Electrical Stimulation;Iontophoresis 4mg /ml Dexamethasone;Moist Heat;Ultrasound;Traction;Stair training;Gait training;Functional mobility training;Therapeutic activities;Neuromuscular re-education;Balance training;Therapeutic exercise;Patient/family education;Manual techniques;Taping;Dry needling;Passive range of motion    PT Next Visit Plan  Nustep, core strengthening, stretching, shoulder ROM, shoulder isometics.    PT Home Exercise Plan  [id number] (SKTC, hamstring stretch, trunk rotation)    Consulted and Agree with Plan of Care  Patient       Patient will benefit from skilled therapeutic intervention in order to improve the following deficits and impairments:   Pain, Postural dysfunction, Decreased strength, Decreased activity tolerance, Decreased range of motion, Difficulty walking  Visit Diagnosis: Acute bilateral low back pain without sciatica - Plan: PT plan of care cert/re-cert  Acute pain of left shoulder - Plan: PT plan of care cert/re-cert  Muscle weakness (generalized) - Plan: PT plan of care cert/re-cert     Problem List Patient Active Problem List   Diagnosis Date Noted  . Acute pain of left shoulder 09/05/2019  . Chronic bilateral low back pain without sciatica 09/05/2019  . OAB (overactive bladder) 08/08/2019  . Insomnia 08/08/2019  . History of melanoma 07/20/2017  . History of basal cell carcinoma 07/20/2017  . IFG (impaired fasting glucose) 07/17/2017  . Macular degeneration   . Chronic fatigue 08/06/2015  . Gastroesophageal reflux disease without esophagitis 08/06/2015  . Glaucoma 08/06/2015  . Hiatal hernia 08/06/2015  . Melanoma of right upper arm ([place]) 08/06/2015  . Mixed hyperlipidemia 08/06/2015  . Rupture of right biceps tendon 08/06/2015  . Biceps tendonitis of both shoulders 08/06/2015    Oretha Caprice, PT 09/30/2019, [place], [place], [address] Phone: [phone]   Fax:  [phone]  Name: Anna [name] MRN: [id number] Date of Birth: Feb 03, 1936

## 2019-10-01 ENCOUNTER — Other Ambulatory Visit: Payer: Self-pay | Admitting: Family Medicine

## 2019-10-01 DIAGNOSIS — E8809 Other disorders of plasma-protein metabolism, not elsewhere classified: Secondary | ICD-10-CM | POA: Diagnosis not present

## 2019-10-01 NOTE — Progress Notes (Signed)
Patient stopped by the office and needed orders for repeat labs. I verified with PCP and only a metabolic panel was needed.

## 2019-10-02 LAB — COMPLETE METABOLIC PANEL WITH GFR
AG Ratio: 2 (calc) (ref 1.0–2.5)
ALT: 6 U/L (ref 6–29)
AST: 11 U/L (ref 10–35)
Albumin: 4.1 g/dL (ref 3.6–5.1)
Alkaline phosphatase (APISO): 61 U/L (ref 37–153)
BUN/Creatinine Ratio: 19 (calc) (ref 6–22)
BUN: 17 mg/dL (ref 7–25)
CO2: 26 mmol/L (ref 20–32)
Calcium: 9.2 mg/dL (ref 8.6–10.4)
Chloride: 104 mmol/L (ref 98–110)
Creat: 0.91 mg/dL — ABNORMAL HIGH (ref 0.60–0.88)
GFR, Est African American: 69 mL/min/{1.73_m2} (ref >=60)
GFR, Est Non African American: 59 mL/min/{1.73_m2} — ABNORMAL LOW (ref >=60)
Globulin: 2.1 g/dL (calc) (ref 1.9–3.7)
Glucose, Bld: 94 mg/dL (ref 65–99)
Potassium: 4.4 mmol/L (ref 3.5–5.3)
Sodium: 140 mmol/L (ref 135–146)
Total Bilirubin: 1.3 mg/dL — ABNORMAL HIGH (ref 0.2–1.2)
Total Protein: 6.2 g/dL (ref 6.1–8.1)

## 2019-10-04 ENCOUNTER — Encounter: Payer: Medicare Other | Admitting: Physical Therapy

## 2019-10-07 ENCOUNTER — Ambulatory Visit (INDEPENDENT_AMBULATORY_CARE_PROVIDER_SITE_OTHER): Payer: Medicare Other | Admitting: Physical Therapy

## 2019-10-07 ENCOUNTER — Other Ambulatory Visit: Payer: Self-pay

## 2019-10-07 DIAGNOSIS — M6281 Muscle weakness (generalized): Secondary | ICD-10-CM | POA: Diagnosis not present

## 2019-10-07 DIAGNOSIS — M25512 Pain in left shoulder: Secondary | ICD-10-CM

## 2019-10-07 DIAGNOSIS — M545 Low back pain, unspecified: Secondary | ICD-10-CM

## 2019-10-07 NOTE — Therapy (Signed)
[place], [place], [address] Phone: [phone]   Fax:  [phone]  Physical Therapy Treatment  Patient Details  Name: Anna [name] MRN: [id number] Date of Birth: June 20, 1936 Referring Provider (PT): Dr. Dianah Field   Encounter Date: 10/07/2019  PT End of Session - 10/07/19 1155    Visit Number  2    Number of Visits  16    Date for PT Re-Evaluation  11/28/19    PT Start Time  1150    PT Stop Time  1230    PT Time Calculation (min)  40 min    Activity Tolerance  Patient tolerated treatment well;No increased pain    Behavior During Therapy  WFL for tasks assessed/performed       Past Medical History:  Diagnosis Date  . Dermatitis    seborrehic scalp  . Hiatal hernia   . History of basal cell carcinoma 07/20/2017   On nose.   Marland Kitchen History of melanoma 07/20/2017   Right upper arm   . Hyperlipidemia   . Macular degeneration     Past Surgical History:  Procedure Laterality Date  . BREAST CYST EXCISION Right 09/1962  . CATARACT EXTRACTION  03/2015  . melonoma Right 06/2016   arm  . MOHS SURGERY Left 06/2016   nose    There were no vitals filed for this visit.  Subjective Assessment - 10/07/19 1155    Subjective  Pt reports things have improved some since last visit.  She has held off on overhead lifting.    Currently in Pain?  Yes    Pain Score  4     Pain Location  Hip    Pain Orientation  Right    Pain Descriptors / Indicators  Tightness;Aching    Aggravating Factors   walking, prolonged standing    Pain Relieving Factors  stretches, heat         [facility] PT Assessment - 10/07/19 0001      Assessment   Medical Diagnosis  Bilateral LBP, M54.5, L shoulder pain     Referring Provider (PT)  Dr. Dulcy Fanny Dominance  Right    Prior Therapy  yes, 5 years ago       [facility] Adult PT Treatment/Exercise - 10/07/19 0001      Transfers   Five time sit to stand comments    17.4 without UE support      Lumbar Exercises: Stretches   Passive Hamstring Stretch  Right;Left;20 seconds;3 reps   supine with strap   Lower Trunk Rotation  3 reps;10 seconds    Piriformis Stretch  Right;Left;2 reps;20 seconds   knee towards opp shoulder per HEP. pillow under sacrum.   Piriformis Stretch Limitations  trial 1 rep seated; unable to tolerate - keep in supine      Lumbar Exercises: Aerobic   Nustep  L4: arms/legs x 6 min for warm up.       Lumbar Exercises: Seated   Other Seated Lumbar Exercises  sit to/from supine with cues for neutral spine; cues required for good form and technique      Lumbar Exercises: Supine   Ab Set  5 reps    AB Set Limitations  difficulty initiating    Bridge  5 reps      Shoulder Exercises: Stretch   Other Shoulder Stretches  unilateral 3 position doorway stretch x 15 sec x 2 reps each side.  bilat bicep stretch holding door  frame x 15 sec x 2 rpes                  PT Long Term Goals - 09/30/19 1156      PT LONG TERM GOAL #1   Title  Pt will be independent in her HEP and progression.    Baseline  initial program given today.    Time  6    Period  Weeks    Status  New    Target Date  11/12/19      PT LONG TERM GOAL #2   Title  Pt will improve her hamstring flexibility to >/= 70 degrees bilaterally.    Time  6    Period  Weeks    Status  New    Target Date  11/12/19      PT LONG TERM GOAL #3   Title  Pt will improve her bilateral hip strength to >/= 4+/5.    Baseline  see flow sheet    Time  6    Period  Weeks    Status  New    Target Date  11/12/19      PT LONG TERM GOAL #4   Title  Pt will improve her 5 time sit to stand to </= 20 seconds using UE support as needed.    Baseline  38 seconds with UE support    Time  6    Period  Weeks    Status  New    Target Date  11/12/19      PT LONG TERM GOAL #5   Title  Pt will be able to report pain </= 2/10 with walking for 15 minutes.    Baseline  walking hurts     Time  6    Period  Weeks    Status  New    Target Date  11/12/19            Plan - 10/07/19 1212    Clinical Impression Statement  Pt scored 17.4 sec on 5x sit to stand; has met LTG#4. Decreased time in HEP for stretches for increased compliance.  Pt reported elimination of pain in back/hip after completion of exercises.  Progressing towards goals.    Comorbidities  chronic fatigue, arthritis, macular degeneration, glaucoma, bicep tendonitis bilateral shoulders, melanoma, basel cell carcinoma, low back pain    Rehab Potential  Good    PT Frequency  2x / week    PT Duration  8 weeks    PT Treatment/Interventions  ADLs/Self Care Home Management;Cryotherapy;Electrical Stimulation;Iontophoresis 68m/ml Dexamethasone;Moist Heat;Ultrasound;Traction;Stair training;Gait training;Functional mobility training;Therapeutic activities;Neuromuscular re-education;Balance training;Therapeutic exercise;Patient/family education;Manual techniques;Taping;Dry needling;Passive range of motion    PT Next Visit Plan  instruct on self massage; continue back care education and stabilization; shoulder ROM/strengthening as needed.    PT Home Exercise Plan  [email or web address]   Consulted and Agree with Plan of Care  Patient       Patient will benefit from skilled therapeutic intervention in order to improve the following deficits and impairments:  Pain, Postural dysfunction, Decreased strength, Decreased activity tolerance, Decreased range of motion, Difficulty walking  Visit Diagnosis: Acute bilateral low back pain without sciatica  Acute pain of left shoulder  Muscle weakness (generalized)     Problem List Patient Active Problem List   Diagnosis Date Noted  . Acute pain of left shoulder 09/05/2019  . Chronic bilateral low back pain without sciatica 09/05/2019  . OAB (overactive bladder) 08/08/2019  . Insomnia 08/08/2019  .  History of melanoma 07/20/2017  . History of basal cell carcinoma 07/20/2017  . IFG  (impaired fasting glucose) 07/17/2017  . Macular degeneration   . Chronic fatigue 08/06/2015  . Gastroesophageal reflux disease without esophagitis 08/06/2015  . Glaucoma 08/06/2015  . Hiatal hernia 08/06/2015  . Melanoma of right upper arm ([place]) 08/06/2015  . Mixed hyperlipidemia 08/06/2015  . Rupture of right biceps tendon 08/06/2015  . Biceps tendonitis of both shoulders 08/06/2015   Kerin Perna, PTA 10/07/19 12:42 PM  [place], [place], [address] Phone: [phone]   Fax:  [phone]  Name: Aliviya Schoeller MRN: [id number] Date of Birth: 1936/08/12

## 2019-10-07 NOTE — Patient Instructions (Signed)
Access Code: [email or web address]  URL: [email or web address]  Date: 10/07/2019  Prepared by: Kerin Perna   Exercises  Hooklying Hamstring Stretch with Strap - 2-3 reps - 15-20seconds hold - 2-3x daily - 7x weekly  Supine Lower Trunk Rotation - 5 reps - 10 hold - 2-3x daily - 7x weekly  Supine Piriformis Stretch with Foot on Ground - 2-3 reps - 1 sets - 15-20 hold - 2x daily - 7x weekly  Hooklying Transversus Abdominis Palpation - 5 reps - 1 sets - 5 hold - 1x daily - 7x weekly  Single Arm Doorway Pec Stretch at 120 Degrees Abduction - 2-3 reps - 1 sets - 15-20 hold - 1x daily - 7x weekly

## 2019-10-10 ENCOUNTER — Encounter: Payer: Self-pay | Admitting: Family Medicine

## 2019-10-10 DIAGNOSIS — H353211 Exudative age-related macular degeneration, right eye, with active choroidal neovascularization: Secondary | ICD-10-CM | POA: Diagnosis not present

## 2019-10-11 ENCOUNTER — Other Ambulatory Visit: Payer: Self-pay

## 2019-10-11 ENCOUNTER — Ambulatory Visit (INDEPENDENT_AMBULATORY_CARE_PROVIDER_SITE_OTHER): Payer: Medicare Other | Admitting: Physical Therapy

## 2019-10-11 DIAGNOSIS — M6281 Muscle weakness (generalized): Secondary | ICD-10-CM

## 2019-10-11 DIAGNOSIS — M545 Low back pain, unspecified: Secondary | ICD-10-CM

## 2019-10-11 DIAGNOSIS — M25512 Pain in left shoulder: Secondary | ICD-10-CM

## 2019-10-11 NOTE — Therapy (Signed)
[facility] [place], [place], [address] Phone: [phone]   Fax:  [phone]  Physical Therapy Treatment  Patient Details  Name: Anna [name] MRN: [id number] Date of Birth: 02-08-1936 Referring Provider (PT): Dr. Dianah Field   Encounter Date: 10/11/2019  PT End of Session - 10/11/19 1106    Visit Number  3    Number of Visits  16    Date for PT Re-Evaluation  11/28/19    Authorization Type  [employer]    PT Start Time  1100    PT Stop Time  [id number]    PT Time Calculation (min)  45 min    Activity Tolerance  Patient tolerated treatment well;No increased pain    Behavior During Therapy  WFL for tasks assessed/performed       Past Medical History:  Diagnosis Date  . Dermatitis    seborrehic scalp  . Hiatal hernia   . History of basal cell carcinoma 07/20/2017   On nose.   Marland Kitchen History of melanoma 07/20/2017   Right upper arm   . Hyperlipidemia   . Macular degeneration     Past Surgical History:  Procedure Laterality Date  . BREAST CYST EXCISION Right 09/1962  . CATARACT EXTRACTION  03/2015  . melonoma Right 06/2016   arm  . MOHS SURGERY Left 06/2016   nose    There were no vitals filed for this visit.  Subjective Assessment - 10/11/19 1104    Subjective  Pt arriving reporting no pain today. Pt did reporting 5/10 yesterday in her R shoulder and left hip. Pt reporting pain was lateral yesterday evening.    Limitations  Walking;Standing    Patient Stated Goals  Walk with out pain.    Currently in Pain?  No/denies    Pain Onset  More than a month ago                       [facility] Adult PT Treatment/Exercise - 10/11/19 0001      Lumbar Exercises: Stretches   Passive Hamstring Stretch  Right;Left;20 seconds;3 reps   supine with strap   Lower Trunk Rotation  3 reps;10 seconds    Piriformis Stretch  Right;Left;2 reps;20 seconds    Piriformis Stretch Limitations  modified position in  supine      Lumbar Exercises: Aerobic   Nustep  L4: arms/legs x 6 min for warm up.       Lumbar Exercises: Seated   Other Seated Lumbar Exercises  deep breathing with shoulder depression      Lumbar Exercises: Supine   Ab Set  5 reps    AB Set Limitations  needed multiple instructions for initiation of contraction    Clam  10 reps;3 seconds;Limitations    Clam Limitations  red theraband    Bridge  10 reps    Straight Leg Raise  5 reps;Limitations    Straight Leg Raises Limitations  2 sets    Other Supine Lumbar Exercises  ball squeezes x 10 holding 5 seconds each      Shoulder Exercises: Stretch   Other Shoulder Stretches  unilateral 3 position doorway stretch x 15 sec x 2 reps each side.  bilat bicep stretch holding door frame x 15 sec x 2 rpes             PT Education - 10/11/19 1105    Education Details  Reviwed HEP verbally    Person(s) Educated  Patient  Methods  Explanation;Demonstration    Comprehension  Verbalized understanding          PT Long Term Goals - 10/11/19 1254      PT LONG TERM GOAL #1   Title  Pt will be independent in her HEP and progression.    Time  6    Period  Weeks    Status  On-going      PT LONG TERM GOAL #2   Title  Pt will improve her hamstring flexibility to >/= 70 degrees bilaterally.    Time  6    Period  Weeks    Status  On-going      PT LONG TERM GOAL #3   Title  Pt will improve her bilateral hip strength to >/= 4+/5.    Baseline  see flow sheet    Time  6    Period  Weeks    Status  On-going      PT LONG TERM GOAL #4   Title  Pt will improve her 5 time sit to stand to </= 20 seconds using UE support as needed.    Baseline  38 seconds with UE support    Time  6    Period  Weeks    Status  On-going      PT LONG TERM GOAL #5   Title  Pt will be able to report pain </= 2/10 with walking for 15 minutes.    Baseline  walking hurts    Time  6    Period  Weeks    Status  On-going            Plan - 10/11/19  1110    Clinical Impression Statement  Pt arriving to therapy reporting no pain. Pt tolerating all exercises well. Stretching time reduced and reps increased for better tolerance. Pt requiring instructions for exercise techniques. Added rows to pt's HEP. Continue skilled PT.    Personal Factors and Comorbidities  Age;Comorbidity 3+    Comorbidities  chronic fatigue, arthritis, macular degeneration, glaucoma, bicep tendonitis bilateral shoulders, melanoma, basel cell carcinoma, low back pain    Examination-Activity Limitations  Other;Transfers;Carry;Reach Overhead;Lift;Bed Mobility;Stand    Examination-Participation Restrictions  Driving;Community Activity;Laundry;Cleaning    Stability/Clinical Decision Making  Stable/Uncomplicated    Rehab Potential  Good    PT Frequency  2x / week    PT Treatment/Interventions  ADLs/Self Care Home Management;Cryotherapy;Electrical Stimulation;Iontophoresis 4mg /ml Dexamethasone;Moist Heat;Ultrasound;Traction;Stair training;Gait training;Functional mobility training;Therapeutic activities;Neuromuscular re-education;Balance training;Therapeutic exercise;Patient/family education;Manual techniques;Taping;Dry needling;Passive range of motion    PT Next Visit Plan  instruct on self massage; continue back care education and stabilization; shoulder ROM/strengthening as needed.    PT Home Exercise Plan  [id number]    Consulted and Agree with Plan of Care  Patient       Patient will benefit from skilled therapeutic intervention in order to improve the following deficits and impairments:  Pain, Postural dysfunction, Decreased strength, Decreased activity tolerance, Decreased range of motion, Difficulty walking  Visit Diagnosis: Acute bilateral low back pain without sciatica  Acute pain of left shoulder  Muscle weakness (generalized)     Problem List Patient Active Problem List   Diagnosis Date Noted  . Acute pain of left shoulder 09/05/2019  . Chronic bilateral  low back pain without sciatica 09/05/2019  . OAB (overactive bladder) 08/08/2019  . Insomnia 08/08/2019  . History of melanoma 07/20/2017  . History of basal cell carcinoma 07/20/2017  . IFG (impaired fasting glucose) 07/17/2017  . Macular  degeneration   . Chronic fatigue 08/06/2015  . Gastroesophageal reflux disease without esophagitis 08/06/2015  . Glaucoma 08/06/2015  . Hiatal hernia 08/06/2015  . Melanoma of right upper arm ([place]) 08/06/2015  . Mixed hyperlipidemia 08/06/2015  . Rupture of right biceps tendon 08/06/2015  . Biceps tendonitis of both shoulders 08/06/2015    Oretha Caprice, PT  10/11/2019, 12:56 PM  [facility] [place], [place], [address] Phone: [phone]   Fax:  [phone]  Name: Greeley Mixson MRN: [id number] Date of Birth: 04/04/1936

## 2019-10-14 ENCOUNTER — Encounter: Payer: Medicare Other | Admitting: Physical Therapy

## 2019-10-18 ENCOUNTER — Other Ambulatory Visit: Payer: Self-pay

## 2019-10-18 ENCOUNTER — Encounter: Payer: Self-pay | Admitting: Physical Therapy

## 2019-10-18 ENCOUNTER — Ambulatory Visit (INDEPENDENT_AMBULATORY_CARE_PROVIDER_SITE_OTHER): Payer: Medicare Other | Admitting: Physical Therapy

## 2019-10-18 DIAGNOSIS — M25512 Pain in left shoulder: Secondary | ICD-10-CM

## 2019-10-18 DIAGNOSIS — M545 Low back pain, unspecified: Secondary | ICD-10-CM

## 2019-10-18 DIAGNOSIS — M6281 Muscle weakness (generalized): Secondary | ICD-10-CM

## 2019-10-18 NOTE — Therapy (Signed)
[facility] [place], [place], [address] Phone: [phone]   Fax:  [phone]  Physical Therapy Treatment  Patient Details  Name: Anna [name] MRN: [id number] Date of Birth: [birth year] Referring Provider (PT): Dr. Dianah Field   Encounter Date: 10/18/2019  PT End of Session - 10/18/19 1115    Visit Number  4    Number of Visits  16    Date for PT Re-Evaluation  11/28/19    Authorization Type  UHC    PT Start Time  1100    PT Stop Time  [id number]    PT Time Calculation (min)  43 min    Activity Tolerance  Patient tolerated treatment well;No increased pain    Behavior During Therapy  WFL for tasks assessed/performed       Past Medical History:  Diagnosis Date  . Dermatitis    seborrehic scalp  . Hiatal hernia   . History of basal cell carcinoma 07/20/2017   On nose.   Marland Kitchen History of melanoma 07/20/2017   Right upper arm   . Hyperlipidemia   . Macular degeneration     Past Surgical History:  Procedure Laterality Date  . BREAST CYST EXCISION Right 09/1962  . CATARACT EXTRACTION  03/2015  . melonoma Right 06/2016   arm  . MOHS SURGERY Left 06/2016   nose    There were no vitals filed for this visit.  Subjective Assessment - 10/18/19 1110    Subjective  Pt arriving to therapy reporting feeling sore and fatigued after last visit. Pt feels that she may have "over did it". Pt reporting 3-4/10 low back pain today.    Limitations  Walking;Standing    Patient Stated Goals  Walk with out pain.    Currently in Pain?  No/denies    Pain Score  4     Pain Location  Back    Pain Orientation  Lower    Pain Descriptors / Indicators  Aching    Pain Type  Acute pain    Pain Onset  More than a month ago         [facility] PT Assessment - 10/18/19 0001      Assessment   Medical Diagnosis  Bilateral LBP, M54.5, L shoulder pain     Referring Provider (PT)  Dr. Dulcy Fanny Dominance  Right    Prior  Therapy  yes, 5 years ago      Transfers   Five time sit to stand comments   13.5 seconds without UE support, pt's hands resting in her lap while sitting in chair with bilateral arm rest                   [facility] Adult PT Treatment/Exercise - 10/18/19 0001      Lumbar Exercises: Stretches   Passive Hamstring Stretch  Right;Left;20 seconds;3 reps   supine with strap   Lower Trunk Rotation  3 reps;20 seconds    Piriformis Stretch  Right;Left;2 reps;20 seconds    Piriformis Stretch Limitations  modified position in supine      Lumbar Exercises: Aerobic   Nustep  L4: arms/legs x 5 min for warm up.       Lumbar Exercises: Seated   Other Seated Lumbar Exercises  deep breathing with shoulder depression and relaxation throughout rest breaks during session    Other Seated Lumbar Exercises  ball squeezes x 10 holding 3 seconds       Lumbar  Exercises: Supine   Ab Set  10 reps;5 seconds    AB Set Limitations  better this session , but still requires verbal cues    Clam  10 reps;3 seconds;Limitations    Clam Limitations  no resistance due to reporting fatigue atter last visit    Bridge  5 reps;3 seconds    Straight Leg Raise  5 reps;Limitations    Straight Leg Raises Limitations  allowing rest time     Other Supine Lumbar Exercises  supine marching x 10 reps x 2 sets      Manual Therapy   Manual Therapy  Soft tissue mobilization    Manual therapy comments  5 minutes    Soft tissue mobilization  gentle STM to left IT band             PT Education - 10/18/19 1113    Education Details  Deep breathing and pacing during exercises    Person(s) Educated  Patient    Methods  Explanation;Demonstration;Verbal cues    Comprehension  Verbalized understanding;Returned demonstration          PT Long Term Goals - 10/18/19 1127      PT LONG TERM GOAL #1   Title  Pt will be independent in her HEP and progression.    Time  6    Period  Weeks    Status  On-going      PT LONG  TERM GOAL #2   Title  Pt will improve her hamstring flexibility to >/= 70 degrees bilaterally.    Time  6    Period  Weeks    Status  On-going      PT LONG TERM GOAL #3   Title  Pt will improve her bilateral hip strength to >/= 4+/5.    Baseline  see flow sheet    Time  6    Period  Weeks    Status  On-going      PT LONG TERM GOAL #4   Title  Pt will improve her 5 time sit to stand to </= 20 seconds using UE support as needed.    Baseline  38 seconds with UE support at eval    Time  6    Period  Weeks    Status  On-going      PT LONG TERM GOAL #5   Title  Pt will be able to report pain </= 2/10 with walking for 15 minutes.    Baseline  walking hurts    Time  6    Period  Weeks    Status  On-going            Plan - 10/18/19 1129    Clinical Impression Statement  Pt arriving to therapy reporting 3-4/10 left hip/low back pain. Pt tolreating the exercises well with rest breaks and deep breathing exercises throughout session. Pt with mild SOB noted while on Nustep. Pt's 02 saturation was 97% on RA. Pt's HR increased to 102 bpm. Recovery time was 3 minute to a rest HR of 74 bpm. Continue with skilled PT to progress toward goals set.    Personal Factors and Comorbidities  Age;Comorbidity 3+    Comorbidities  chronic fatigue, arthritis, macular degeneration, glaucoma, bicep tendonitis bilateral shoulders, melanoma, basel cell carcinoma, low back pain    Examination-Activity Limitations  Other;Transfers;Carry;Reach Overhead;Lift;Bed Mobility;Stand    Examination-Participation Restrictions  Driving;Community Activity;Laundry;Cleaning    Stability/Clinical Decision Making  Stable/Uncomplicated    Rehab Potential  Good  PT Frequency  2x / week    PT Duration  8 weeks    PT Treatment/Interventions  ADLs/Self Care Home Management;Cryotherapy;Electrical Stimulation;Iontophoresis 4mg /ml Dexamethasone;Moist Heat;Ultrasound;Traction;Stair training;Gait training;Functional mobility  training;Therapeutic activities;Neuromuscular re-education;Balance training;Therapeutic exercise;Patient/family education;Manual techniques;Taping;Dry needling;Passive range of motion    PT Next Visit Plan  instruct on self massage; continue back care education and stabilization; shoulder ROM/strengthening as needed.    PT Home Exercise Plan  [id number]    Consulted and Agree with Plan of Care  Patient       Patient will benefit from skilled therapeutic intervention in order to improve the following deficits and impairments:  Pain, Postural dysfunction, Decreased strength, Decreased activity tolerance, Decreased range of motion, Difficulty walking  Visit Diagnosis: Acute bilateral low back pain without sciatica  Acute pain of left shoulder  Muscle weakness (generalized)     Problem List Patient Active Problem List   Diagnosis Date Noted  . Acute pain of left shoulder 09/05/2019  . Chronic bilateral low back pain without sciatica 09/05/2019  . OAB (overactive bladder) 08/08/2019  . Insomnia 08/08/2019  . History of melanoma 07/20/2017  . History of basal cell carcinoma 07/20/2017  . IFG (impaired fasting glucose) 07/17/2017  . Macular degeneration   . Chronic fatigue 08/06/2015  . Gastroesophageal reflux disease without esophagitis 08/06/2015  . Glaucoma 08/06/2015  . Hiatal hernia 08/06/2015  . Melanoma of right upper arm ([place]) 08/06/2015  . Mixed hyperlipidemia 08/06/2015  . Rupture of right biceps tendon 08/06/2015  . Biceps tendonitis of both shoulders 08/06/2015    Oretha Caprice, PT 10/18/2019, 11:49 AM  [facility] [place], [place], [address] Phone: [phone]   Fax:  [phone]  Name: Anna [name] MRN: [id number] Date of Birth: 02-24-1936

## 2019-10-21 ENCOUNTER — Other Ambulatory Visit: Payer: Self-pay

## 2019-10-21 ENCOUNTER — Ambulatory Visit (INDEPENDENT_AMBULATORY_CARE_PROVIDER_SITE_OTHER): Payer: Medicare Other | Admitting: Physical Therapy

## 2019-10-21 ENCOUNTER — Encounter: Payer: Self-pay | Admitting: Physical Therapy

## 2019-10-21 DIAGNOSIS — M25512 Pain in left shoulder: Secondary | ICD-10-CM

## 2019-10-21 DIAGNOSIS — M6281 Muscle weakness (generalized): Secondary | ICD-10-CM | POA: Diagnosis not present

## 2019-10-21 DIAGNOSIS — M545 Low back pain, unspecified: Secondary | ICD-10-CM

## 2019-10-21 NOTE — Patient Instructions (Signed)

## 2019-10-21 NOTE — Therapy (Signed)
[facility] [address] [place], [place], [address] Phone: [phone]   Fax:  [phone]  Physical Therapy Treatment  Patient Details  Name: Anna [name] MRN: [id number] Date of Birth: 11/02/1935 Referring Provider (PT): Dr. Thekkekandam   Encounter Date: 10/21/2019  PT End of Session - 10/21/19 1109    Visit Number  5    Number of Visits  16    Date for PT Re-Evaluation  11/28/19    Authorization Type  UHC    PT Start Time  1102    PT Stop Time  1146    PT Time Calculation (min)  44 min    Activity Tolerance  Patient tolerated treatment well;No increased pain    Behavior During Therapy  WFL for tasks assessed/performed       Past Medical History:  Diagnosis Date  . Dermatitis    seborrehic scalp  . Hiatal hernia   . History of basal cell carcinoma 07/20/2017   On nose.   . History of melanoma 07/20/2017   Right upper arm   . Hyperlipidemia   . Macular degeneration     Past Surgical History:  Procedure Laterality Date  . BREAST CYST EXCISION Right 09/1962  . CATARACT EXTRACTION  03/2015  . melonoma Right 06/2016   arm  . MOHS SURGERY Left 06/2016   nose    There were no vitals filed for this visit.  Subjective Assessment - 10/21/19 1110    Subjective  Patient reporting she is doing better in both her back and shoulder.         [facility] PT Assessment - 10/21/19 0001      Strength   Right Hip Flexion  4+/5    Right Hip ABduction  4+/5    Left Hip Flexion  4+/5    Left Hip ABduction  4/5      Flexibility   Hamstrings  L: 65 degrees only tightness noted in back of leg, R:75 degrees,    tested with opposite leg straight                  [facility] Adult PT Treatment/Exercise - 10/21/19 0001      Transfers   Five time sit to stand comments   10.8 seconds without UE support, pt's hands resting in her lap while sitting in chair with bilateral arm rest      Lumbar Exercises: Stretches    Passive Hamstring Stretch  Right;Left;2 reps;30 seconds    Lower Trunk Rotation  10 seconds;2 reps    Piriformis Stretch  Right;Left;2 reps;20 seconds    Piriformis Stretch Limitations  modified position in supine      Lumbar Exercises: Aerobic   Nustep  L4: arms/legs x 5 min for warm up.       Lumbar Exercises: Supine   AB Set Limitations  worked using TA pictures (see pt instructions)    Clam Limitations  attempted with TA contraction, but difficult    Bridge  3 seconds;10 reps    Other Supine Lumbar Exercises  attempted with TA contraction but too difficult to coordinate contraction, breathing and LE movement             PT Education - 10/21/19 1134    Education Details  HEP -transverse abdominus using breathing/pelvic floor.    Person(s) Educated  Patient    Methods  Explanation;Handout;Tactile cues;Verbal cues    Comprehension  Verbalized understanding;Returned demonstration            PT Long Term Goals - 10/21/19 1110      PT LONG TERM GOAL #1   Title  Pt will be independent in her HEP and progression.    Baseline  able to do one set of HEP yesterday    Status  On-going      PT LONG TERM GOAL #2   Title  Pt will improve her hamstring flexibility to >/= 70 degrees bilaterally.    Status  On-going      PT LONG TERM GOAL #3   Title  Pt will improve her bilateral hip strength to >/= 4+/5.    Status  Partially Met      PT LONG TERM GOAL #4   Title  Pt will improve her 5 time sit to stand to </= 20 seconds using UE support as needed.    Baseline  10.8 sec    Status  Achieved      PT LONG TERM GOAL #5   Title  Pt will be able to report pain </= 2/10 with walking for 15 minutes.    Baseline  has not attempted prolonged walking yet    Status  On-going            Plan - 10/21/19 1504    Clinical Impression Statement  Patient reporting continued improvement in hip and low back pain today. She is progressing well toward her goals. She tolerated TE well  today. We spent a lot of time working on transverse adominus contraction using breathing and pelvic floor muscles. Pt reported increased understanding and demonstrated improved ability to engage TA at end of session. Tolerance to HEP is also improving.    Comorbidities  chronic fatigue, arthritis, macular degeneration, glaucoma, bicep tendonitis bilateral shoulders, melanoma, basel cell carcinoma, low back pain    PT Frequency  2x / week    PT Duration  8 weeks    PT Treatment/Interventions  ADLs/Self Care Home Management;Cryotherapy;Electrical Stimulation;Iontophoresis 74m/ml Dexamethasone;Moist Heat;Ultrasound;Traction;Stair training;Gait training;Functional mobility training;Therapeutic activities;Neuromuscular re-education;Balance training;Therapeutic exercise;Patient/family education;Manual techniques;Taping;Dry needling;Passive range of motion    PT Next Visit Plan  instruct on self massage; continue back care education and stabilization; shoulder ROM/strengthening as needed.    PT Home Exercise Plan  [id number] added Transverse Abdominus/breathing (see pt instructions)    Consulted and Agree with Plan of Care  Patient       Patient will benefit from skilled therapeutic intervention in order to improve the following deficits and impairments:  Pain, Postural dysfunction, Decreased strength, Decreased activity tolerance, Decreased range of motion, Difficulty walking  Visit Diagnosis: Acute bilateral low back pain without sciatica  Acute pain of left shoulder  Muscle weakness (generalized)     Problem List Patient Active Problem List   Diagnosis Date Noted  . Acute pain of left shoulder 09/05/2019  . Chronic bilateral low back pain without sciatica 09/05/2019  . OAB (overactive bladder) 08/08/2019  . Insomnia 08/08/2019  . History of melanoma 07/20/2017  . History of basal cell carcinoma 07/20/2017  . IFG (impaired fasting glucose) 07/17/2017  . Macular degeneration   . Chronic  fatigue 08/06/2015  . Gastroesophageal reflux disease without esophagitis 08/06/2015  . Glaucoma 08/06/2015  . Hiatal hernia 08/06/2015  . [place] 08/06/2015  . Mixed hyperlipidemia 08/06/2015  . Rupture of right biceps tendon 08/06/2015  . Biceps tendonitis of both shoulders 08/06/2015    JMadelyn FlavorsPT 10/21/2019, 3:12 PM  [id number] [address]  [place], [place], [address] Phone: [phone]   Fax:  [phone]  Name: Anna [name] MRN: [id number] Date of Birth: 21-Nov-1935

## 2019-10-24 ENCOUNTER — Ambulatory Visit: Payer: Medicare Other | Admitting: Sports Medicine

## 2019-10-25 ENCOUNTER — Ambulatory Visit (INDEPENDENT_AMBULATORY_CARE_PROVIDER_SITE_OTHER): Payer: Medicare Other | Admitting: Physical Therapy

## 2019-10-25 ENCOUNTER — Other Ambulatory Visit: Payer: Self-pay

## 2019-10-25 ENCOUNTER — Encounter: Payer: Self-pay | Admitting: Physical Therapy

## 2019-10-25 DIAGNOSIS — M6281 Muscle weakness (generalized): Secondary | ICD-10-CM | POA: Diagnosis not present

## 2019-10-25 DIAGNOSIS — M545 Low back pain, unspecified: Secondary | ICD-10-CM

## 2019-10-25 DIAGNOSIS — M25512 Pain in left shoulder: Secondary | ICD-10-CM

## 2019-10-25 NOTE — Therapy (Addendum)
[facility] [place], [place], [address] Phone: [phone]   Fax:  [phone]  Physical Therapy Treatment  Patient Details  Name: Anna [name] MRN: [id number] Date of Birth: 04/30/1936 Referring Provider (PT): Dr. Dianah Field   Encounter Date: 10/25/2019  PT End of Session - 10/25/19 1105    Visit Number  6    Number of Visits  16    Date for PT Re-Evaluation  11/28/19    Authorization Type  [employer]    PT Start Time  5883    PT Stop Time  1100    PT Time Calculation (min)  45 min    Activity Tolerance  Patient tolerated treatment well;No increased pain    Behavior During Therapy  WFL for tasks assessed/performed       Past Medical History:  Diagnosis Date  . Dermatitis    seborrehic scalp  . Hiatal hernia   . History of basal cell carcinoma 07/20/2017   On nose.   Marland Kitchen History of melanoma 07/20/2017   Right upper arm   . Hyperlipidemia   . Macular degeneration     Past Surgical History:  Procedure Laterality Date  . BREAST CYST EXCISION Right 09/1962  . CATARACT EXTRACTION  03/2015  . melonoma Right 06/2016   arm  . MOHS SURGERY Left 06/2016   nose    There were no vitals filed for this visit.  Subjective Assessment - 10/25/19 1024    Subjective  Pt reporting she is doing much better. Pt reporting 2/10 pain in her low back more on the right side.    Limitations  Walking;Standing    Patient Stated Goals  Walk with out pain.    Currently in Pain?  Yes    Pain Score  2     Pain Location  Back    Pain Orientation  Lower;Right    Pain Descriptors / Indicators  Aching    Pain Radiating Towards  more on the R side, no radiation down the R leg    Pain Onset  More than a month ago    Pain Frequency  Intermittent                       [facility] Adult PT Treatment/Exercise - 10/25/19 0001      Lumbar Exercises: Stretches   Passive Hamstring Stretch  Right;Left;2 reps;30 seconds     Lower Trunk Rotation  10 seconds;2 reps    Lower Trunk Rotation Limitations  seated    Piriformis Stretch  Right;Left;2 reps;20 seconds    Piriformis Stretch Limitations  modified position in supine      Lumbar Exercises: Aerobic   Nustep  L4: arms/legs x 5 min for warm up.       Lumbar Exercises: Seated   Other Seated Lumbar Exercises  marching with abdominal stabilzation      Lumbar Exercises: Supine   Ab Set  5 reps;3 seconds    AB Set Limitations  worked using [employer] (see pt instructions)    Clam  10 reps;3 seconds;Limitations    Clam Limitations  attempted with TA contraction, but difficult    Bridge  3 seconds;10 reps    Straight Leg Raise  5 reps;2 seconds;Limitations    Straight Leg Raises Limitations  2 sets due to fatigue    Other Supine Lumbar Exercises  attempted with TA contraction but too difficult to coordinate contraction, breathing and LE movement  Manual Therapy   Manual Therapy  Soft tissue mobilization    Manual therapy comments  8 minutes    Soft tissue mobilization  STM to lumbar paraspinals and IT band                  PT Long Term Goals - 10/25/19 1116      PT LONG TERM GOAL #1   Title  Pt will be independent in her HEP and progression.    Baseline  able to do one set of HEP yesterday    Time  6    Period  Weeks    Status  On-going      PT LONG TERM GOAL #2   Title  Pt will improve her hamstring flexibility to >/= 70 degrees bilaterally.    Time  6    Period  Weeks    Status  On-going      PT LONG TERM GOAL #3   Title  Pt will improve her bilateral hip strength to >/= 4+/5.    Baseline  see flow sheet    Time  6    Period  Weeks    Status  Partially Met      PT LONG TERM GOAL #4   Title  Pt will improve her 5 time sit to stand to </= 20 seconds using UE support as needed.    Baseline  10.8 sec    Time  6    Period  Weeks    Status  Achieved      PT LONG TERM GOAL #5   Title  Pt will be able to report pain </=  2/10 with walking for 15 minutes.    Baseline  has not attempted prolonged walking yet    Period  Weeks    Status  On-going            Plan - 10/25/19 1032    Clinical Impression Statement  Pt making progress with more functional tasks at home. Pt still requiring cues for TA contraction and core stabilizaiton. Pt tolreating exercises well with incresaed respiratory rate with rest breaks as needed. Continue skilled to progress toward goals set. Pt reporting that her $30 co-pay is a littel much financially and pt wishing to spread her therapy sessions out. She agreeded to work on her HEP and then follow up with therapy in 3 weeks for possible discharge plan or agreeing to continued therpay every 2 weeks. .    Personal Factors and Comorbidities  Age;Comorbidity 3+    Comorbidities  chronic fatigue, arthritis, macular degeneration, glaucoma, bicep tendonitis bilateral shoulders, melanoma, basel cell carcinoma, low back pain    Examination-Activity Limitations  Other;Transfers;Carry;Reach Overhead;Lift;Bed Mobility;Stand    Examination-Participation Restrictions  Driving;Community Activity;Laundry;Cleaning    Stability/Clinical Decision Making  Stable/Uncomplicated    Rehab Potential  Good    PT Frequency  2x / week    PT Duration  8 weeks    PT Treatment/Interventions  ADLs/Self Care Home Management;Cryotherapy;Electrical Stimulation;Iontophoresis 26m/ml Dexamethasone;Moist Heat;Ultrasound;Traction;Stair training;Gait training;Functional mobility training;Therapeutic activities;Neuromuscular re-education;Balance training;Therapeutic exercise;Patient/family education;Manual techniques;Taping;Dry needling;Passive range of motion    PT Next Visit Plan  instruct on self massage; continue back care education and stabilization; shoulder ROM/strengthening as needed.    PT Home Exercise Plan  [id number] added Transverse Abdominus/breathing (see pt instructions), seated trunk rotation    Consulted and  Agree with Plan of Care  Patient       Patient will benefit from skilled therapeutic intervention in  order to improve the following deficits and impairments:  Pain, Postural dysfunction, Decreased strength, Decreased activity tolerance, Decreased range of motion, Difficulty walking  Visit Diagnosis: Acute bilateral low back pain without sciatica  Acute pain of left shoulder  Muscle weakness (generalized)     Problem List Patient Active Problem List   Diagnosis Date Noted  . Acute pain of left shoulder 09/05/2019  . Chronic bilateral low back pain without sciatica 09/05/2019  . OAB (overactive bladder) 08/08/2019  . Insomnia 08/08/2019  . History of melanoma 07/20/2017  . History of basal cell carcinoma 07/20/2017  . IFG (impaired fasting glucose) 07/17/2017  . Macular degeneration   . Chronic fatigue 08/06/2015  . Gastroesophageal reflux disease without esophagitis 08/06/2015  . Glaucoma 08/06/2015  . Hiatal hernia 08/06/2015  . Melanoma of right upper arm ([place]) 08/06/2015  . Mixed hyperlipidemia 08/06/2015  . Rupture of right biceps tendon 08/06/2015  . Biceps tendonitis of both shoulders 08/06/2015   PHYSICAL THERAPY DISCHARGE SUMMARY  Visits from Start of Care: 6  Current functional level related to goals / functional outcomes: See above   Remaining deficits: See above    Education / Equipment: HEP, posture and core stabilization Plan: Patient agrees to discharge.  Patient goals were not met. Patient is being discharged due to being pleased with the current functional level.  ?????        Kearney Hard, PT 12/30/19 10:50 AM   Pt has not been seen since last visit. Pt is being discharged from PT services.    Oretha Caprice, PT 10/25/2019, 11:18 AM  [facility] [address] [place], [place], [address] Phone: [phone]   Fax:  [phone]  Name: Anna [name] MRN: [id number] Date  of Birth: 27-Oct-1935

## 2019-10-28 ENCOUNTER — Other Ambulatory Visit: Payer: Self-pay | Admitting: Family Medicine

## 2019-10-28 DIAGNOSIS — K219 Gastro-esophageal reflux disease without esophagitis: Secondary | ICD-10-CM

## 2019-10-29 ENCOUNTER — Other Ambulatory Visit: Payer: Self-pay

## 2019-10-29 ENCOUNTER — Encounter: Payer: Self-pay | Admitting: Sports Medicine

## 2019-10-29 ENCOUNTER — Ambulatory Visit (INDEPENDENT_AMBULATORY_CARE_PROVIDER_SITE_OTHER): Payer: Medicare Other | Admitting: Sports Medicine

## 2019-10-29 DIAGNOSIS — M545 Low back pain, unspecified: Secondary | ICD-10-CM

## 2019-10-29 DIAGNOSIS — M25512 Pain in left shoulder: Secondary | ICD-10-CM

## 2019-10-29 DIAGNOSIS — G8929 Other chronic pain: Secondary | ICD-10-CM

## 2019-10-29 NOTE — Assessment & Plan Note (Signed)
Anna [name] returns, she is a very pleasant 84 year old female, she had symptoms consistent with impingement syndrome and we injected her subacromial bursa at the last visit. She returns today completely symptom-free, she is doing well with physical therapy and is going to be graduated to home exercise program, she can return to see me as needed.

## 2019-10-29 NOTE — Assessment & Plan Note (Signed)
Axial low back pain consistent with lumbar spinal stenosis, better with forward flexion. Physical therapy has helped significantly for this as well, she can return to see me as needed.

## 2019-10-29 NOTE — Progress Notes (Signed)
    Procedures performed today:    None.  Independent interpretation of tests performed by another provider:   None.  Impression and Recommendations:    Acute pain of left shoulder Anna [name] returns, she is a very pleasant 84 year old female, she had symptoms consistent with impingement syndrome and we injected her subacromial bursa at the last visit. She returns today completely symptom-free, she is doing well with physical therapy and is going to be graduated to home exercise program, she can return to see me as needed.  Chronic bilateral low back pain without sciatica Axial low back pain consistent with lumbar spinal stenosis, better with forward flexion. Physical therapy has helped significantly for this as well, she can return to see me as needed.    ___________________________________________ Gwen Her. Dianah Field, M.D., ABFM., CAQSM. Primary Care and Faith Instructor of [place] [facility]

## 2019-11-15 ENCOUNTER — Encounter: Payer: Medicare Other | Admitting: Physical Therapy

## 2019-11-22 ENCOUNTER — Other Ambulatory Visit: Payer: Self-pay

## 2019-11-22 ENCOUNTER — Ambulatory Visit (INDEPENDENT_AMBULATORY_CARE_PROVIDER_SITE_OTHER): Payer: Medicare Other | Admitting: Family Medicine

## 2019-11-22 ENCOUNTER — Encounter: Payer: Self-pay | Admitting: Family Medicine

## 2019-11-22 VITALS — BP 135/76 | HR 93 | Ht 63.0 in | Wt 189.0 lb

## 2019-11-22 DIAGNOSIS — M8580 Other specified disorders of bone density and structure, unspecified site: Secondary | ICD-10-CM | POA: Diagnosis not present

## 2019-11-22 DIAGNOSIS — J45909 Unspecified asthma, uncomplicated: Secondary | ICD-10-CM | POA: Insufficient documentation

## 2019-11-22 DIAGNOSIS — M150 Primary generalized (osteo)arthritis: Secondary | ICD-10-CM

## 2019-11-22 DIAGNOSIS — J4540 Moderate persistent asthma, uncomplicated: Secondary | ICD-10-CM | POA: Diagnosis not present

## 2019-11-22 DIAGNOSIS — M21611 Bunion of right foot: Secondary | ICD-10-CM | POA: Diagnosis not present

## 2019-11-22 DIAGNOSIS — M159 Polyosteoarthritis, unspecified: Secondary | ICD-10-CM

## 2019-11-22 DIAGNOSIS — M1711 Unilateral primary osteoarthritis, right knee: Secondary | ICD-10-CM | POA: Insufficient documentation

## 2019-11-22 DIAGNOSIS — J984 Other disorders of lung: Secondary | ICD-10-CM

## 2019-11-22 DIAGNOSIS — M170 Bilateral primary osteoarthritis of knee: Secondary | ICD-10-CM | POA: Insufficient documentation

## 2019-11-22 DIAGNOSIS — L439 Lichen planus, unspecified: Secondary | ICD-10-CM | POA: Diagnosis not present

## 2019-11-22 DIAGNOSIS — E559 Vitamin D deficiency, unspecified: Secondary | ICD-10-CM | POA: Diagnosis not present

## 2019-11-22 DIAGNOSIS — J449 Chronic obstructive pulmonary disease, unspecified: Secondary | ICD-10-CM

## 2019-11-22 DIAGNOSIS — M8949 Other hypertrophic osteoarthropathy, multiple sites: Secondary | ICD-10-CM

## 2019-11-22 DIAGNOSIS — J439 Emphysema, unspecified: Secondary | ICD-10-CM

## 2019-11-22 NOTE — Assessment & Plan Note (Signed)
Her endodontist would like [place] to check her vitamin D level.  She does get a small amount of supplementation with her calcium and her PreserVision which is her eye vitamins she will call back and let us know how much.

## 2019-11-22 NOTE — Assessment & Plan Note (Signed)
We will go ahead and refer to podiatry.  Dr. Earleen Newport should be starting with a soon here in order location so we will check on that.  If he is not then we will go from there.

## 2019-11-22 NOTE — Progress Notes (Signed)
Established Patient Office Visit  Subjective:  Patient ID: Anna [name], female    DOB: 1936-03-30  Age: 84 y.o. MRN: [id number]  CC:  Chief Complaint  Patient presents with  . Asthma    HPI Anna [name] presents for follow-up of relatively new diagnosis of mixed obstructive and restrictive lung disease most consistent with asthma.  I just saw her approximately 2 months ago for spirometry she actually had a really good response to albuterol.  We decided to put her on Advair and have her follow-up in 2 months to see how well she was doing and see if some of her symptoms including wheezing and shortness of breath had improved. So far doing well on Advair.  Some days she is only using it once and usually she will do that at night.  She is also been attending physical therapy sessions for her left shoulder and low back as coordinated by Dr. Dianah Field her sports medicine physician.she is now doing home exercises and doing well.   He just saw her endodontist and they recommended that she have a vitamin D check.  She has lichen planus on her gums.  She is mostly been managing it with a medicated mouthwash and that actually does seem to be helping.  They recommended that she have her vitamin D checked as well.  She previously lived in [place] but has lived in [place] for about 4 to 5 years now.  She also wanted know if there was anything that we would recommend for her arthritis and inflammation.  She takes turmeric daily.  She already completed physical therapy and has been doing some home exercises.  She does take Tylenol and ibuprofen occasionally as well.  Past Medical History:  Diagnosis Date  . Dermatitis    seborrehic scalp  . Hiatal hernia   . History of basal cell carcinoma 07/20/2017   On nose.   Marland Kitchen History of melanoma 07/20/2017   Right upper arm   . Hyperlipidemia   . Macular degeneration     Past Surgical History:  Procedure Laterality Date  . BREAST CYST  EXCISION Right 09/1962  . CATARACT EXTRACTION  03/2015  . melonoma Right 06/2016   arm  . MOHS SURGERY Left 06/2016   nose    Family History  Problem Relation Age of Onset  . Hyperlipidemia Sister   . Macular degeneration Sister   . Cancer - Prostate Brother   . Atrial fibrillation Maternal Aunt   . Cancer Mother        pancreatic  . Cancer Father        liver    Social History   Socioeconomic History  . Marital status: Single    Spouse name: Not on file  . Number of children: Not on file  . Years of education: 12+  . Highest education level: Master's degree (e.g., MA, MS, MEng, MEd, MSW, MBA)  Occupational History  . 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  Tobacco Use  . Smoking status: Never Smoker  . Smokeless tobacco: Never Used  Substance and Sexual Activity  . Alcohol use: Yes    Alcohol/week: 1.0 standard drinks    Types: 1 Glasses of wine per week    Comment: occasionally  . Drug use: No  . Sexual activity: Never  Other Topics Concern  . Not on file  Social History Narrative   She has a masters degree in creative  arts.  He lives alone in a retirement community.  Her sister is Pharmacist, community.    Social Determinants of Health   Financial Resource Strain:   . Difficulty of Paying Living Expenses: Not on file  Food Insecurity:   . Worried About Charity fundraiser in the Last Year: Not on file  . Ran Out of Food in the Last Year: Not on file  Transportation Needs:   . Lack of Transportation (Medical): Not on file  . Lack of Transportation (Non-Medical): Not on file  Physical Activity: Inactive  . Days of Exercise per Week: 0 days  . Minutes of Exercise per Session: 0 min  Stress:   . Feeling of Stress : Not on file  Social Connections: Unknown  . Frequency of Communication with Friends and Family: Not on file  . Frequency of Social Gatherings with Friends and Family: Once a week  .  Attends Religious Services: Not on file  . Active Member of Clubs or Organizations: Not on file  . Attends Archivist Meetings: Not on file  . Marital Status: Not on file  Intimate Partner Violence:   . Fear of Current or Ex-Partner: Not on file  . Emotionally Abused: Not on file  . Physically Abused: Not on file  . Sexually Abused: Not on file    Outpatient Medications Prior to Visit  Medication Sig Dispense Refill  . albuterol (VENTOLIN HFA) 108 (90 Base) MCG/ACT inhaler Inhale 2 puffs into the lungs 2 (two) times daily.     Marland Kitchen ALPRAZolam (XANAX) 0.25 MG tablet Take 1 tablet (0.25 mg total) by mouth at bedtime as needed for sleep. 30 tablet 0  . AMBULATORY NON FORMULARY MEDICATION Take 5 mLs by mouth 2 (two) times daily. Swish 5 ML by mouth for 30 seconds and spit twice daily for two weeks  3  . Bevacizumab 2.75 MG/0.11ML SOSY Inject into the eye.    . calcium citrate-vitamin D (CITRACAL+D) 315-200 MG-UNIT tablet Take 1 tablet by mouth 2 (two) times daily.    . COD LIVER OIL PO Take 1 mL by mouth daily.    . Flaxseed Oil OIL 1,000 mg.    . Fluticasone-Salmeterol (ADVAIR) 250-50 MCG/DOSE AEPB Inhale 1 puff into the lungs 2 (two) times daily. 60 each 5  . ketoconazole (NIZORAL) 2 % shampoo USE THREE TIMES A WEEK AS DIRECTED    . latanoprost (XALATAN) 0.005 % ophthalmic solution Place 1 drop into both eyes daily.    Marland Kitchen lovastatin (MEVACOR) 40 MG tablet TAKE ONE TABLET BY MOUTH NIGHTLY 90 tablet 3  . Lysine 500 MG CAPS Take 1 capsule by mouth daily.    . meclizine (ANTIVERT) 25 MG tablet Take 1 tablet (25 mg total) by mouth 3 (three) times daily as needed for dizziness. 30 tablet 0  . Multiple Vitamins-Minerals (PRESERVISION AREDS 2) CAPS Take by mouth.    Marland Kitchen MYRBETRIQ 25 MG TB24 tablet Take 1 tablet (25 mg total) by mouth daily. 30 tablet 11  . omeprazole (PRILOSEC) 40 MG capsule Take 1 capsule (40 mg total) by mouth daily. 90 capsule 0  . simethicone (MYLICON) 0000000 MG chewable  tablet Chew 125 mg by mouth every 6 (six) hours as needed for flatulence.    . sucralfate (CARAFATE) 1 g tablet TAKE 1 TABLET BY MOUTH  TWICE DAILY 180 tablet 3  . timolol (BETIMOL) 0.5 % ophthalmic solution Place 1 drop into the left eye 2 (two) times daily.    Marland Kitchen  timolol (TIMOPTIC) 0.5 % ophthalmic solution     . TURMERIC PO Take 1,000 mg by mouth.    Marland Kitchen UNABLE TO FIND Right eye injection every 5-6 weeks for Macular Degeneration    . vitamin B-12 (CYANOCOBALAMIN) 1000 MCG tablet Take 1,000 mcg by mouth daily.     Javier Docker Oil 1000 MG CAPS Take 1,000 mg by mouth.     No facility-administered medications prior to visit.    Allergies  Allergen Reactions  . Aleve [Naproxen Sodium]     Causes elevation in cholesterol   . Azithromycin Rash    ROS Review of Systems    Objective:    Physical Exam  Constitutional: She is oriented to person, place, and time. She appears well-developed and well-nourished.  HENT:  Head: Normocephalic and atraumatic.  Cardiovascular: Normal rate, regular rhythm and normal heart sounds.  Pulmonary/Chest: Effort normal and breath sounds normal.  Neurological: She is alert and oriented to person, place, and time.  Skin: Skin is warm and dry.  Psychiatric: She has a normal mood and affect. Her behavior is normal.    BP 135/76   Pulse 93   Ht 5\' 3"  (1.6 m)   Wt 189 lb (85.7 kg)   SpO2 97%   BMI 33.48 kg/m  Wt Readings from Last 3 Encounters:  11/22/19 189 lb (85.7 kg)  09/26/19 190 lb (86.2 kg)  09/24/19 191 lb (86.6 kg)     There are no preventive care reminders to display for this patient.  There are no preventive care reminders to display for this patient.  No results found for: TSH No results found for: WBC, HGB, HCT, MCV, PLT Lab Results  Component Value Date   NA 140 10/01/2019   K 4.4 10/01/2019   CO2 26 10/01/2019   GLUCOSE 94 10/01/2019   BUN 17 10/01/2019   CREATININE 0.91 (H) 10/01/2019   BILITOT 1.3 (H) 10/01/2019   ALKPHOS  63 11/14/2016   AST 11 10/01/2019   ALT 6 10/01/2019   PROT 6.2 10/01/2019   CALCIUM 9.2 10/01/2019   Lab Results  Component Value Date   CHOL 170 08/09/2019   Lab Results  Component Value Date   HDL 57 08/09/2019   Lab Results  Component Value Date   LDLCALC 91 08/09/2019   Lab Results  Component Value Date   TRIG 122 08/09/2019   Lab Results  Component Value Date   CHOLHDL 3.0 08/09/2019   Lab Results  Component Value Date   HGBA1C 5.7 (A) 08/08/2019      Assessment & Plan:   Problem List Items Addressed This Visit      Respiratory   Mixed restrictive and obstructive lung disease ([place]) - Primary   Asthma in adult    So far doing well on Advair.  Some days she is only using it once and usually she will do that at night.  We discussed using it at least every morning and then if she is doing well it is okay to skip the evening.  But if she notices that her shortness of breath is increasing then she needs to use it twice a day consistently.  Plan to follow-up in 4 months.        Musculoskeletal and Integument   Primary osteoarthritis involving multiple joints   Lichen planus    Her endodontist would like [place] to check her vitamin D level.  She does get a small amount of supplementation with her calcium and her  PreserVision which is her eye vitamins she will call back and let us know how much.      Relevant Orders   VITAMIN D 25 Hydroxy (Vit-D Deficiency, Fractures)   Bunion, right foot    We will go ahead and refer to podiatry.  Dr. Earleen Newport should be starting with a soon here in order location so we will check on that.  If he is not then we will go from there.      Relevant Orders   Ambulatory referral to Podiatry      General arthritis-recommend a trial of Voltaren gel.  Can apply onto the joint up to 3 times a day.  Did discuss with her though that it does not tend to work as well on the spine but certainly if she would like to give it a try she can.  No  orders of the defined types were placed in this encounter.   Follow-up: Return in about 4 months (around 03/21/2020) for asthma and glucose.    Beatrice Lecher, MD

## 2019-11-22 NOTE — Assessment & Plan Note (Signed)
So far doing well on Advair.  Some days she is only using it once and usually she will do that at night.  We discussed using it at least every morning and then if she is doing well it is okay to skip the evening.  But if she notices that her shortness of breath is increasing then she needs to use it twice a day consistently.  Plan to follow-up in 4 months.

## 2019-11-23 LAB — VITAMIN D 25 HYDROXY (VIT D DEFICIENCY, FRACTURES): Vit D, 25-Hydroxy: 36 ng/mL (ref 30–100)

## 2020-01-14 ENCOUNTER — Telehealth: Payer: Self-pay

## 2020-01-14 NOTE — Telephone Encounter (Signed)
Added to [employer].

## 2020-01-14 NOTE — Telephone Encounter (Signed)
M85.80 Other specified disorders of bone density and structure, unspecified site

## 2020-01-14 NOTE — Telephone Encounter (Signed)
Billing trailer for Vitamin D.   Covered codes -   E21.0 Primary hyperparathyroidism E21.1 Secondary hyperparathyroidism, not elsewhere classified E21.3 Hyperparathyroidism, unspecified E55.9 Vitamin D deficiency, unspecified E83.51 Hypocalcemia E83.52 Hypercalcemia E83.59 Other disorders of calcium metabolism M60.89 Other myositis, multiple sites M79.1 Myalgia M79.7 Fibromyalgia M81.0 Age-related osteoporosis without current pathological fracture M81.8 Other osteoporosis without current pathological fracture M85.80 Other specified disorders of bone density and structure, unspecified site M85.89 Other specified disorders of bone density and structure, multiple sites M85.9 Disorder of bone density and structure, unspecified M89.9 Disorder of bone, unspecified N18.3 Chronic kidney disease, stage 3 (moderate) N18.4 Chronic kidney disease, stage 4 (severe) N25.81 Secondary hyperparathyroidism of renal origin Z79.899 Other long term (current) drug therapy

## 2020-01-30 ENCOUNTER — Other Ambulatory Visit: Payer: Self-pay | Admitting: Family Medicine

## 2020-01-30 DIAGNOSIS — K219 Gastro-esophageal reflux disease without esophagitis: Secondary | ICD-10-CM

## 2020-02-04 DIAGNOSIS — H353211 Exudative age-related macular degeneration, right eye, with active choroidal neovascularization: Secondary | ICD-10-CM | POA: Diagnosis not present

## 2020-02-04 DIAGNOSIS — H527 Unspecified disorder of refraction: Secondary | ICD-10-CM | POA: Diagnosis not present

## 2020-02-04 DIAGNOSIS — H26493 Other secondary cataract, bilateral: Secondary | ICD-10-CM | POA: Diagnosis not present

## 2020-02-04 DIAGNOSIS — H353122 Nonexudative age-related macular degeneration, left eye, intermediate dry stage: Secondary | ICD-10-CM | POA: Diagnosis not present

## 2020-02-04 DIAGNOSIS — H401131 Primary open-angle glaucoma, bilateral, mild stage: Secondary | ICD-10-CM | POA: Diagnosis not present

## 2020-02-04 DIAGNOSIS — D3132 Benign neoplasm of left choroid: Secondary | ICD-10-CM | POA: Diagnosis not present

## 2020-02-18 ENCOUNTER — Telehealth: Payer: Self-pay

## 2020-02-18 NOTE — Telephone Encounter (Signed)
Billing trailer for vitamin D.   Covered codes -   E21.0 Primary hyperparathyroidism E21.1 Secondary hyperparathyroidism, not elsewhere classified E21.3 Hyperparathyroidism, unspecified E55.9 Vitamin D deficiency, unspecified E83.51 Hypocalcemia E83.52 Hypercalcemia E83.59 Other disorders of calcium metabolism M60.89 Other myositis, multiple sites M79.1 Myalgia M79.7 Fibromyalgia M81.0 Age-related osteoporosis without current pathological fracture M81.8 Other osteoporosis without current pathological fracture M85.80 Other specified disorders of bone density and structure, unspecified site M85.89 Other specified disorders of bone density and structure, multiple sites M85.9 Disorder of bone density and structure, unspecified M89.9 Disorder of bone, unspecified N18.3 Chronic kidney disease, stage 3 (moderate) N18.4 Chronic kidney disease, stage 4 (severe) N25.81 Secondary hyperparathyroidism of renal origin Z79.899 Other long term (current) drug therapy

## 2020-02-18 NOTE — Telephone Encounter (Signed)
E55.9 Vitamin D deficiency, unspecified

## 2020-02-19 NOTE — Telephone Encounter (Signed)
Added

## 2020-02-22 IMAGING — DX DG SHOULDER 2+V*L*
3 series · 3 of 3 positions shown · non-contrast
Comparison: 07/17/2017

CLINICAL DATA: Shoulder pain

EXAM:
LEFT SHOULDER - 2+ VIEW

[shoulder grashey]
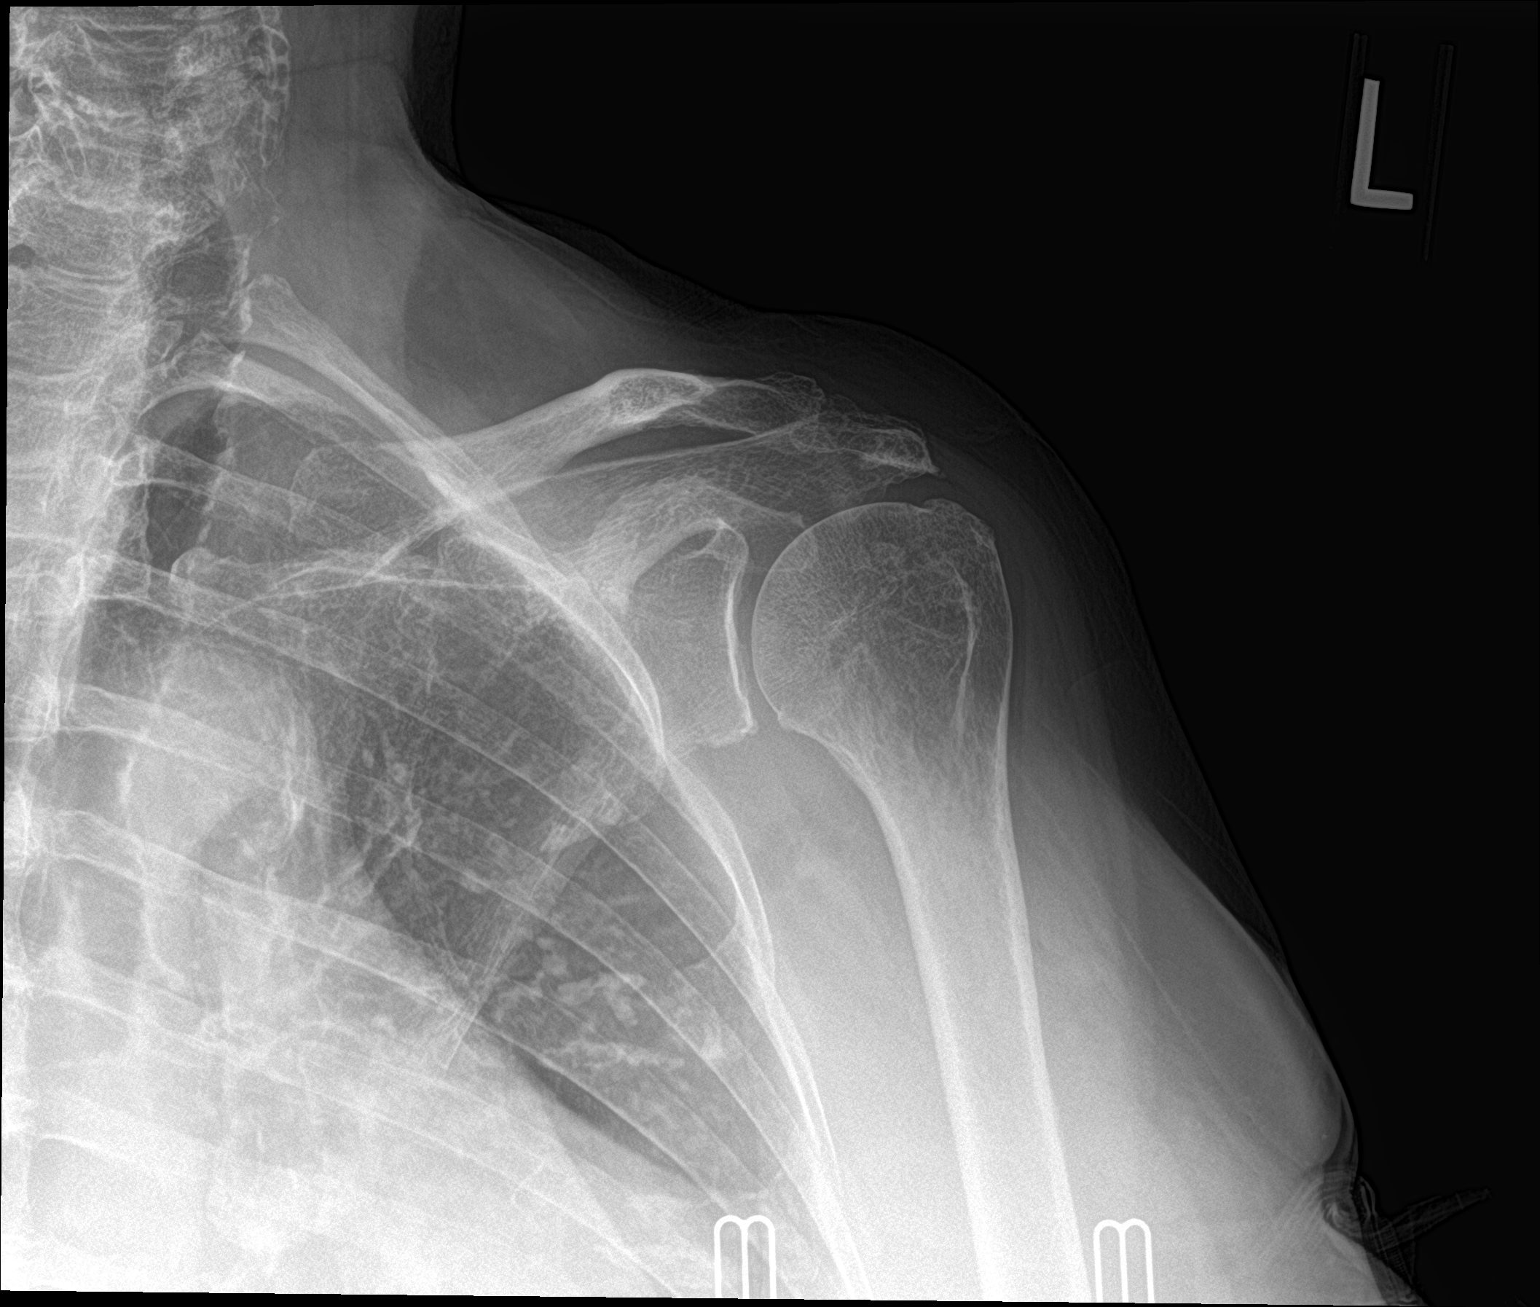

[shoulder y view]
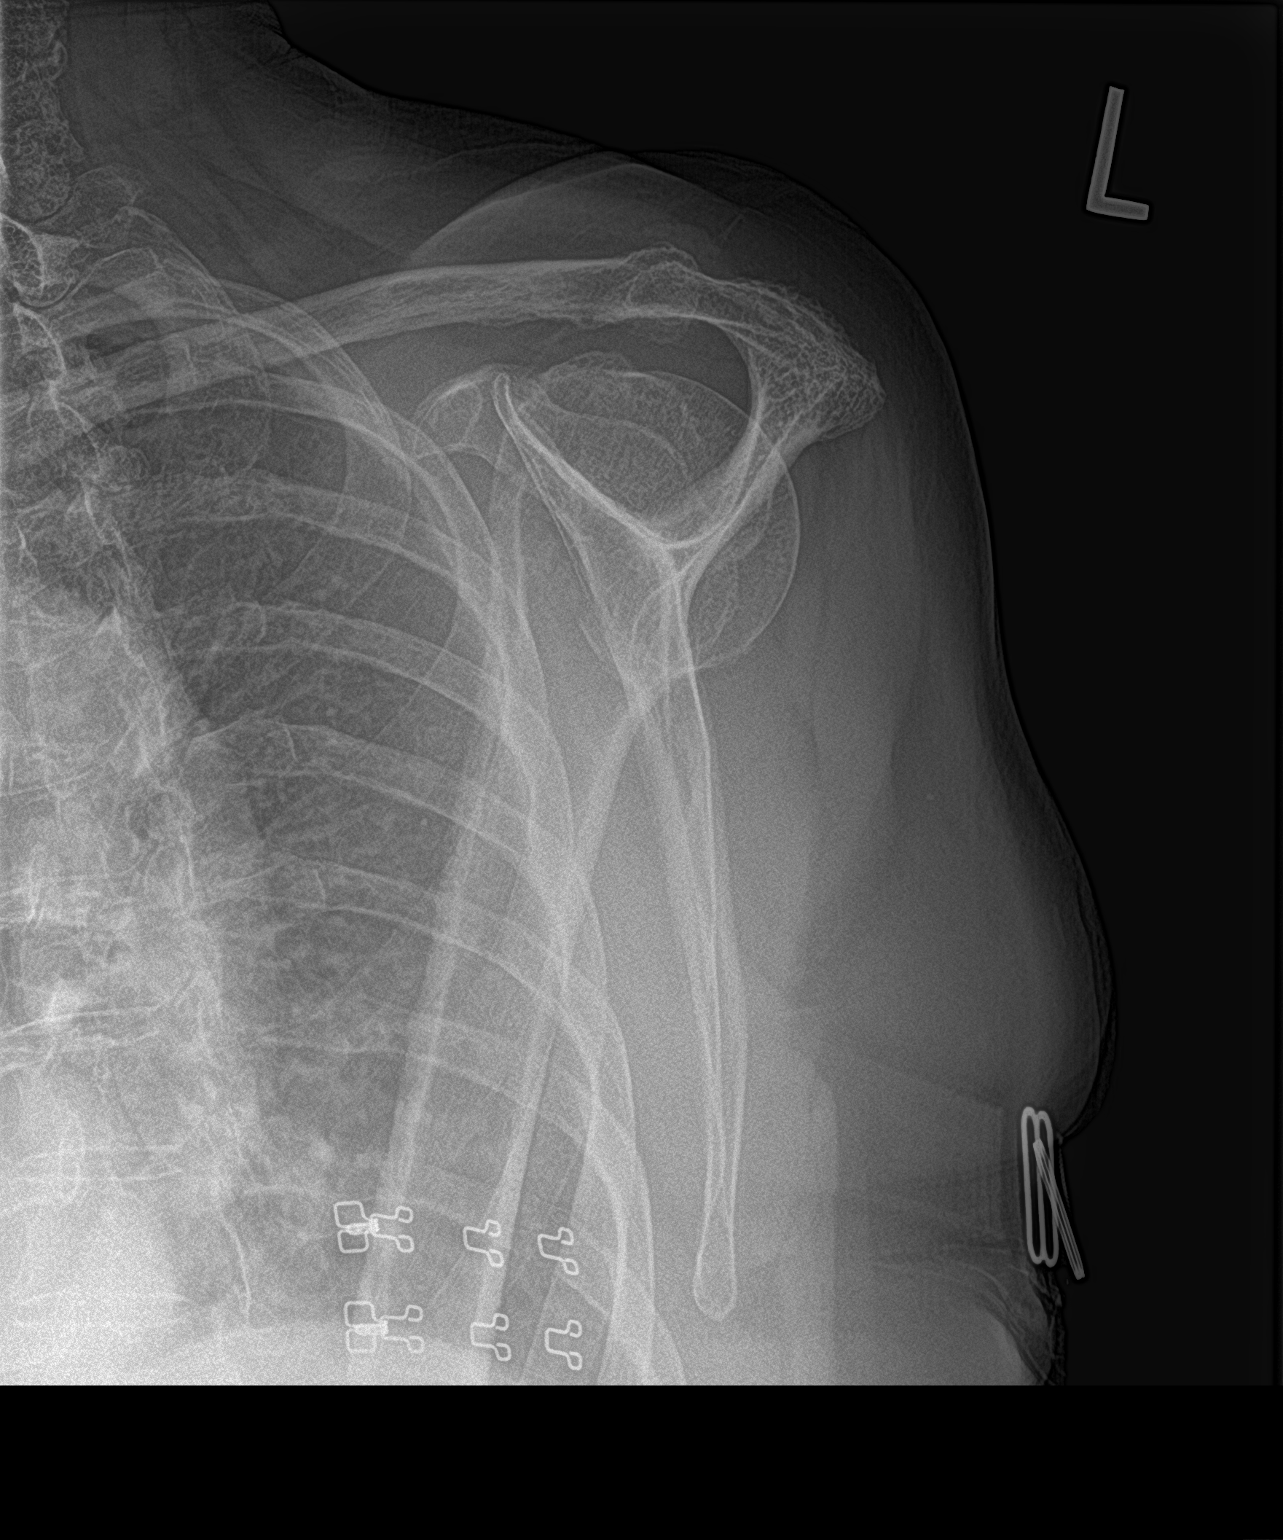

[shoulder axillary]
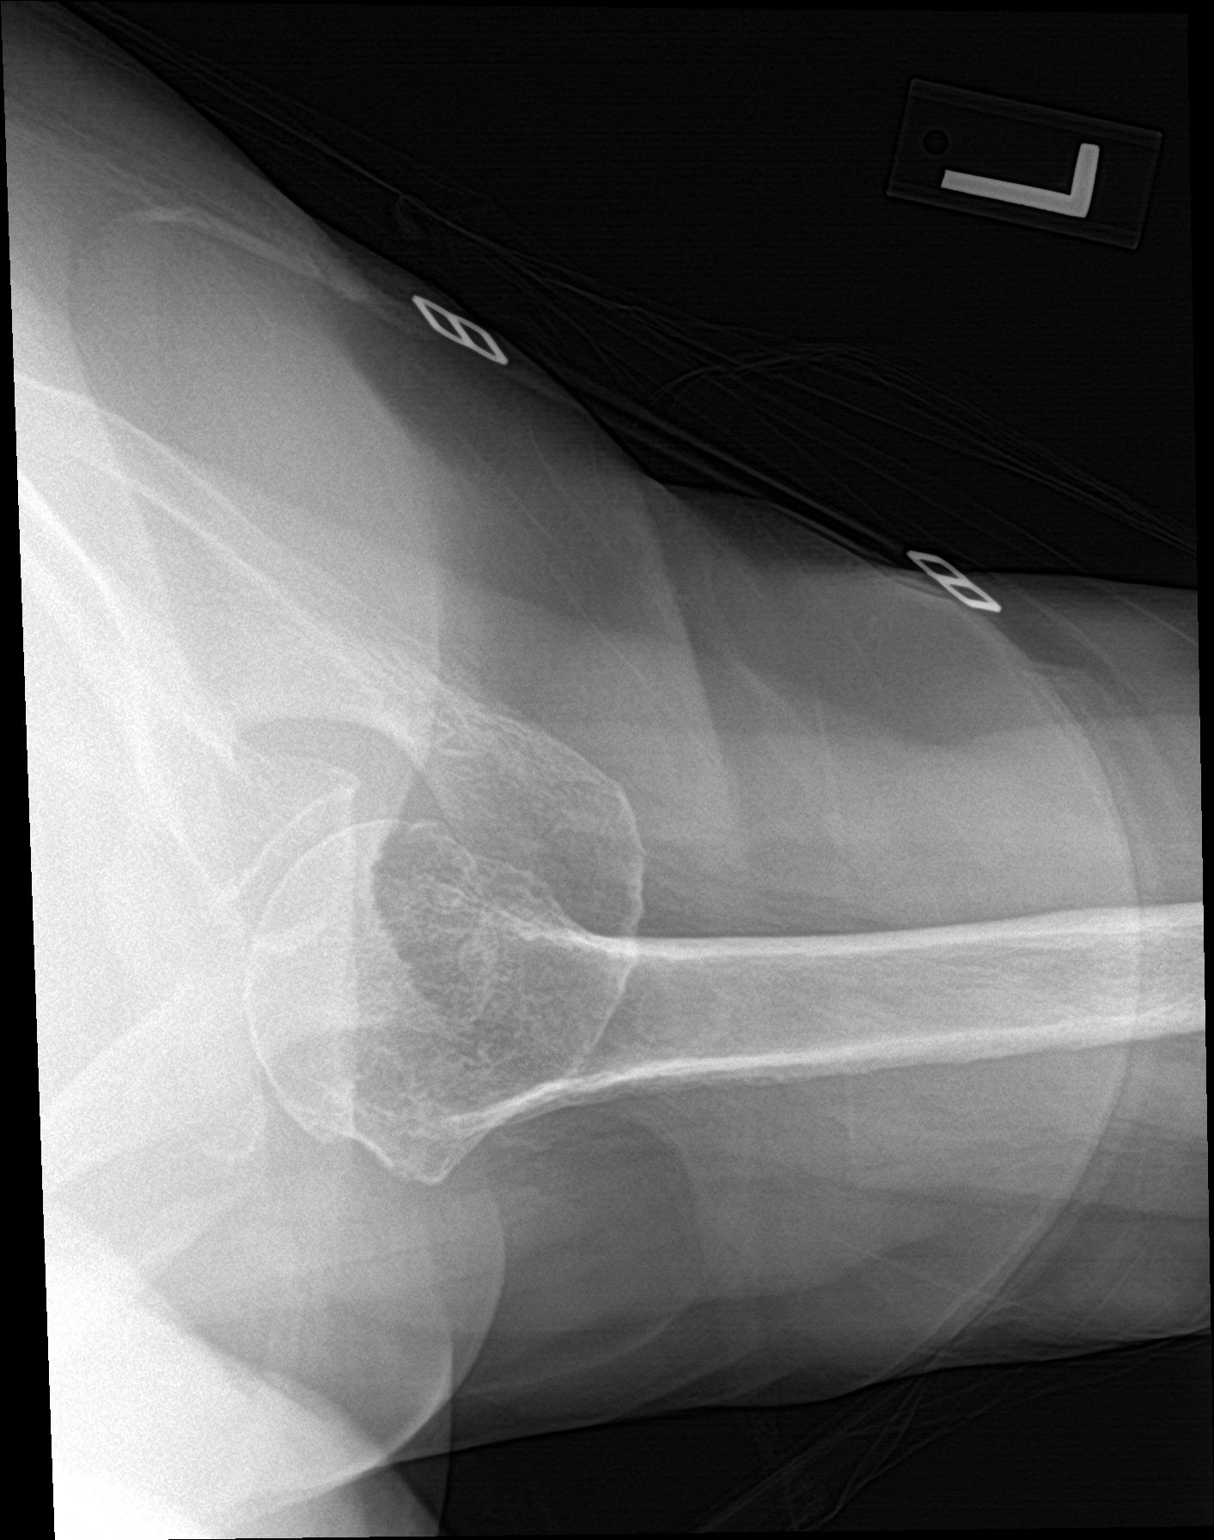

[3 of 3 positions shown; findings below may reference images not displayed]

FINDINGS: There is no acute displaced fracture or dislocation. There is
mild-to-moderate glenohumeral osteoarthritis. The osseous
mineralization is decreased.
IMPRESSION: 1. No acute displaced fracture or dislocation.
2. Mild to moderate glenohumeral osteoarthritis.
3. Osteopenia.

## 2020-03-02 DIAGNOSIS — L570 Actinic keratosis: Secondary | ICD-10-CM | POA: Diagnosis not present

## 2020-03-02 DIAGNOSIS — B372 Candidiasis of skin and nail: Secondary | ICD-10-CM | POA: Diagnosis not present

## 2020-03-02 DIAGNOSIS — L918 Other hypertrophic disorders of the skin: Secondary | ICD-10-CM | POA: Diagnosis not present

## 2020-03-02 DIAGNOSIS — Z85820 Personal history of malignant melanoma of skin: Secondary | ICD-10-CM | POA: Diagnosis not present

## 2020-03-02 DIAGNOSIS — R233 Spontaneous ecchymoses: Secondary | ICD-10-CM | POA: Diagnosis not present

## 2020-03-14 ENCOUNTER — Other Ambulatory Visit: Payer: Self-pay | Admitting: Family Medicine

## 2020-03-16 ENCOUNTER — Encounter: Payer: Self-pay | Admitting: Family Medicine

## 2020-03-16 ENCOUNTER — Ambulatory Visit (INDEPENDENT_AMBULATORY_CARE_PROVIDER_SITE_OTHER): Payer: Medicare Other | Admitting: Family Medicine

## 2020-03-16 ENCOUNTER — Other Ambulatory Visit: Payer: Self-pay

## 2020-03-16 VITALS — BP 159/83 | HR 88 | Ht 63.0 in | Wt 190.0 lb

## 2020-03-16 DIAGNOSIS — E0590 Thyrotoxicosis, unspecified without thyrotoxic crisis or storm: Secondary | ICD-10-CM | POA: Diagnosis not present

## 2020-03-16 DIAGNOSIS — M25473 Effusion, unspecified ankle: Secondary | ICD-10-CM

## 2020-03-16 DIAGNOSIS — Z79899 Other long term (current) drug therapy: Secondary | ICD-10-CM | POA: Diagnosis not present

## 2020-03-16 DIAGNOSIS — N3281 Overactive bladder: Secondary | ICD-10-CM | POA: Diagnosis not present

## 2020-03-16 DIAGNOSIS — R7301 Impaired fasting glucose: Secondary | ICD-10-CM

## 2020-03-16 DIAGNOSIS — J439 Emphysema, unspecified: Secondary | ICD-10-CM

## 2020-03-16 DIAGNOSIS — R233 Spontaneous ecchymoses: Secondary | ICD-10-CM

## 2020-03-16 DIAGNOSIS — R202 Paresthesia of skin: Secondary | ICD-10-CM

## 2020-03-16 DIAGNOSIS — J984 Other disorders of lung: Secondary | ICD-10-CM

## 2020-03-16 DIAGNOSIS — R7989 Other specified abnormal findings of blood chemistry: Secondary | ICD-10-CM | POA: Diagnosis not present

## 2020-03-16 DIAGNOSIS — R5383 Other fatigue: Secondary | ICD-10-CM

## 2020-03-16 LAB — POCT GLYCOSYLATED HEMOGLOBIN (HGB A1C): Hemoglobin A1C: 5.5 % (ref 4.0–5.6)

## 2020-03-16 NOTE — Progress Notes (Signed)
Established Patient Office Visit  Subjective:  Patient ID: Anna [name], female    DOB: [birth year]  Age: 84 y.o. MRN: [id number]  CC:  Chief Complaint  Patient presents with  . Asthma  . IFG    HPI Anna [name] presents for   Impaired fasting glucose-no increased thirst or urination. No symptoms consistent with hypoglycemia.  She has been to see dermatology and was diagnosed with spontaneous bruising.  She was rx a cream to try for 6 months.    Mixed obstructive and restrictive lung dx. - 4 mo f/u.   she has been suing the Advaira dn feels that does help. She says her wheezing has almost completely resolved.  She says her shortness of breath is significantly improved as well she says when it gets really hot and humid in the summer that is when she has problems breathing.  She has been using her Advair twice a day pretty consistently.  Overactive bladder-she takes her Myrbetriq as needed if she is just cannot leave the house otherwise she does not use it.  She also complains of bilateral ankle swelling that is been going on for about 2 to 3 weeks.  She says she has been eating some pretzels recently.  She also complains of some numbness and tingling in the bottom of her feet that been going on for about a week she says it comes and goes.  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  She reports she actually sleeps really well at night.  Past Medical History:  Diagnosis Date  . Dermatitis    seborrehic scalp  . Hiatal hernia   . History of basal cell  carcinoma 07/20/2017   On nose.   Marland Kitchen History of melanoma 07/20/2017   Right upper arm   . Hyperlipidemia   . Macular degeneration     Past Surgical History:  Procedure Laterality Date  . BREAST CYST EXCISION Right 09/1962  . CATARACT EXTRACTION  03/2015  . melonoma Right 06/2016   arm  . MOHS SURGERY Left 06/2016   nose    Family History  Problem Relation Age of Onset  . Hyperlipidemia Sister   . Macular degeneration Sister   . Cancer - Prostate Brother   . Atrial fibrillation Maternal Aunt   . Cancer Mother        pancreatic  . Cancer Father        liver    Social History   Socioeconomic History  . Marital status: Single    Spouse name: Not on file  . Number of children: Not on file  . Years of education: 12+  . Highest education level: Master's degree (e.g., MA, MS, MEng, MEd, MSW, MBA)  Occupational History  . 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  Tobacco Use  . Smoking status: Never Smoker  . Smokeless tobacco: Never Used  Vaping Use  . Vaping Use: Never used  Substance and Sexual Activity  .  Alcohol use: Yes    Alcohol/week: 1.0 standard drink    Types: 1 Glasses of wine per week    Comment: occasionally  . Drug use: No  . Sexual activity: Never  Other Topics Concern  . Not on file  Social History Narrative   She has a Scientist, water quality in Film/video editor.  He lives alone in a retirement community.  Her sister is Pharmacist, community.    Social Determinants of Health   Financial Resource Strain:   . Difficulty of Paying Living Expenses:   Food Insecurity:   . Worried About Charity fundraiser in the Last Year:   . Arboriculturist in the Last Year:   Transportation Needs:   . Film/video editor (Medical):   Marland Kitchen Lack of Transportation (Non-Medical):   Physical Activity: Inactive  . Days of Exercise per Week: 0 days  . Minutes of Exercise per Session: 0 min  Stress:   . Feeling of Stress :    Social Connections: Unknown  . Frequency of Communication with Friends and Family: Not on file  . Frequency of Social Gatherings with Friends and Family: Once a week  . Attends Religious Services: Not on file  . Active Member of Clubs or Organizations: Not on file  . Attends Archivist Meetings: Not on file  . Marital Status: Not on file  Intimate Partner Violence:   . Fear of Current or Ex-Partner:   . Emotionally Abused:   Marland Kitchen Physically Abused:   . Sexually Abused:     Outpatient Medications Prior to Visit  Medication Sig Dispense Refill  . albuterol (VENTOLIN HFA) 108 (90 Base) MCG/ACT inhaler Inhale 2 puffs into the lungs 2 (two) times daily.     Marland Kitchen ALPRAZolam (XANAX) 0.25 MG tablet Take 1 tablet (0.25 mg total) by mouth at bedtime as needed for sleep. 30 tablet 0  . AMBULATORY NON FORMULARY MEDICATION Take 5 mLs by mouth 2 (two) times daily. Swish 5 ML by mouth for 30 seconds and spit twice daily for two weeks  3  . ammonium lactate (AMLACTIN) 12 % cream Apply topically as needed for dry skin.    . Bevacizumab 2.75 MG/0.11ML SOSY Inject into the eye.    . calcium citrate-vitamin D (CITRACAL+D) 315-200 MG-UNIT tablet Take 1 tablet by mouth 2 (two) times daily.    . COD LIVER OIL PO Take 1 mL by mouth daily.    . Flaxseed Oil OIL 1,000 mg.    . Fluticasone-Salmeterol (ADVAIR) 250-50 MCG/DOSE AEPB Inhale 1 puff into the lungs 2 (two) times daily. 60 each 5  . ketoconazole (NIZORAL) 2 % shampoo USE THREE TIMES A WEEK AS DIRECTED    . latanoprost (XALATAN) 0.005 % ophthalmic solution Place 1 drop into both eyes daily.    Marland Kitchen lovastatin (MEVACOR) 40 MG tablet TAKE ONE TABLET BY MOUTH NIGHTLY 90 tablet 3  . Lysine 500 MG CAPS Take 1 capsule by mouth daily.    . meclizine (ANTIVERT) 25 MG tablet Take 1 tablet (25 mg total) by mouth 3 (three) times daily as needed for dizziness. 30 tablet 0  . Multiple Vitamins-Minerals (PRESERVISION AREDS 2) CAPS Take by mouth.    Marland Kitchen MYRBETRIQ 25  MG TB24 tablet Take 1 tablet (25 mg total) by mouth daily. (Patient taking differently: Take 25 mg by mouth as needed. ) 30 tablet 11  . omeprazole (PRILOSEC) 40 MG capsule Take 1 capsule (40 mg total) by mouth daily. 90 capsule  3  . simethicone (MYLICON) 389 MG chewable tablet Chew 125 mg by mouth every 6 (six) hours as needed for flatulence.    . sucralfate (CARAFATE) 1 g tablet TAKE 1 TABLET BY MOUTH  TWICE DAILY 180 tablet 3  . timolol (BETIMOL) 0.5 % ophthalmic solution Place 1 drop into the left eye 2 (two) times daily.    . timolol (TIMOPTIC) 0.5 % ophthalmic solution     . TURMERIC PO Take 1,000 mg by mouth.    Marland Kitchen UNABLE TO FIND Right eye injection every 5-6 weeks for Macular Degeneration    . vitamin B-12 (CYANOCOBALAMIN) 1000 MCG tablet Take 1,000 mcg by mouth daily.     Marland Kitchen ammonium lactate (LAC-HYDRIN) 12 % lotion Apply topically.     No facility-administered medications prior to visit.    Allergies  Allergen Reactions  . Aleve [Naproxen Sodium]     Causes elevation in cholesterol   . Azithromycin Rash    ROS Review of Systems    Objective:    Physical Exam Constitutional:      Appearance: She is well-developed.  HENT:     Head: Normocephalic and atraumatic.  Cardiovascular:     Rate and Rhythm: Normal rate and regular rhythm.     Heart sounds: Normal heart sounds.     Comments: Radial pulse 2+ Pulmonary:     Effort: Pulmonary effort is normal.     Breath sounds: Normal breath sounds.  Musculoskeletal:     Comments: Trace ankle swelling bilaterally a little bit worse on the outside of the ankles.  Skin:    General: Skin is warm and dry.  Neurological:     Mental Status: She is alert and oriented to person, place, and time.  Psychiatric:        Behavior: Behavior normal.     BP (!) 159/83   Pulse 88   Ht 5\' 3"  (1.6 m)   Wt 190 lb (86.2 kg)   SpO2 98%   BMI 33.66 kg/m  Wt Readings from Last 3 Encounters:  03/16/20 190 lb (86.2 kg)  11/22/19 189 lb  (85.7 kg)  09/26/19 190 lb (86.2 kg)     There are no preventive care reminders to display for this patient.  There are no preventive care reminders to display for this patient.  No results found for: TSH No results found for: WBC, HGB, HCT, MCV, PLT Lab Results  Component Value Date   NA 140 10/01/2019   K 4.4 10/01/2019   CO2 26 10/01/2019   GLUCOSE 94 10/01/2019   BUN 17 10/01/2019   CREATININE 0.91 (H) 10/01/2019   BILITOT 1.3 (H) 10/01/2019   ALKPHOS 63 11/14/2016   AST 11 10/01/2019   ALT 6 10/01/2019   PROT 6.2 10/01/2019   CALCIUM 9.2 10/01/2019   Lab Results  Component Value Date   CHOL 170 08/09/2019   Lab Results  Component Value Date   HDL 57 08/09/2019   Lab Results  Component Value Date   LDLCALC 91 08/09/2019   Lab Results  Component Value Date   TRIG 122 08/09/2019   Lab Results  Component Value Date   CHOLHDL 3.0 08/09/2019   Lab Results  Component Value Date   HGBA1C 5.5 03/16/2020      Assessment & Plan:   Problem List Items Addressed This Visit      Respiratory   Mixed restrictive and obstructive lung disease ([place])   Relevant Orders   CBC   COMPLETE  METABOLIC PANEL WITH GFR   TSH   B12   Urinalysis, Routine w reflex microscopic     Endocrine   IFG (impaired fasting glucose) - Primary   Relevant Orders   POCT glycosylated hemoglobin (Hb A1C) (Completed)   CBC   COMPLETE METABOLIC PANEL WITH GFR   TSH   B12   Urinalysis, Routine w reflex microscopic     Genitourinary   OAB (overactive bladder)   Relevant Orders   CBC   COMPLETE METABOLIC PANEL WITH GFR   TSH   B12   Urinalysis, Routine w reflex microscopic     Other   Spontaneous ecchymoses   Relevant Orders   CBC   COMPLETE METABOLIC PANEL WITH GFR   TSH   B12   Urinalysis, Routine w reflex microscopic    Other Visit Diagnoses    Ankle swelling, unspecified laterality       Relevant Orders   CBC   COMPLETE METABOLIC PANEL WITH GFR   TSH   B12    Urinalysis, Routine w reflex microscopic   Fatigue, unspecified type       Paresthesia of both feet           Elevated BP without HTN with recent onset ankle swelling. Discussed working on reducing sodium intake. F/U in 2 weeks.    Fatigue - likely related to deconditioning and mood but will eval for anemia dn thyroid d/o.  Discussed getting involved with friends and scheduling exercise time.    No orders of the defined types were placed in this encounter.   Follow-up: Return in about 2 weeks (around 03/30/2020) for recheck BP with nusre visit. Beatrice Lecher, MD

## 2020-03-16 NOTE — Patient Instructions (Signed)
Cut back on salty foods

## 2020-03-17 LAB — VITAMIN B12: Vitamin B-12: 1147 pg/mL — ABNORMAL HIGH (ref 200–1100)

## 2020-03-17 LAB — CBC
HCT: 44.2 % (ref 35.0–45.0)
Hemoglobin: 14.9 g/dL (ref 11.7–15.5)
MCH: 30.8 pg (ref 27.0–33.0)
MCHC: 33.7 g/dL (ref 32.0–36.0)
MCV: 91.5 fL (ref 80.0–100.0)
MPV: 11.2 fL (ref 7.5–12.5)
Platelets: 217 10*3/uL (ref 140–400)
RBC: 4.83 10*6/uL (ref 3.80–5.10)
RDW: 12.6 % (ref 11.0–15.0)
WBC: 11.3 10*3/uL — ABNORMAL HIGH (ref 3.8–10.8)

## 2020-03-17 LAB — COMPLETE METABOLIC PANEL WITH GFR
AG Ratio: 2.1 (calc) (ref 1.0–2.5)
ALT: 10 U/L (ref 6–29)
AST: 19 U/L (ref 10–35)
Albumin: 4.1 g/dL (ref 3.6–5.1)
Alkaline phosphatase (APISO): 52 U/L (ref 37–153)
BUN: 10 mg/dL (ref 7–25)
CO2: 24 mmol/L (ref 20–32)
Calcium: 9.8 mg/dL (ref 8.6–10.4)
Chloride: 104 mmol/L (ref 98–110)
Creat: 0.83 mg/dL (ref 0.60–0.88)
GFR, Est African American: 76 mL/min/{1.73_m2} (ref >=60)
GFR, Est Non African American: 66 mL/min/{1.73_m2} (ref >=60)
Globulin: 2 g/dL (calc) (ref 1.9–3.7)
Glucose, Bld: 113 mg/dL — ABNORMAL HIGH (ref 65–99)
Potassium: 4.4 mmol/L (ref 3.5–5.3)
Sodium: 141 mmol/L (ref 135–146)
Total Bilirubin: 0.8 mg/dL (ref 0.2–1.2)
Total Protein: 6.1 g/dL (ref 6.1–8.1)

## 2020-03-17 LAB — URINALYSIS, ROUTINE W REFLEX MICROSCOPIC
Bacteria, UA: NONE SEEN /HPF
Bilirubin Urine: NEGATIVE
Glucose, UA: NEGATIVE
Hgb urine dipstick: NEGATIVE
Hyaline Cast: NONE SEEN /LPF
Ketones, ur: NEGATIVE
Nitrite: NEGATIVE
Protein, ur: NEGATIVE
Specific Gravity, Urine: 1.015 (ref 1.001–1.03)
Squamous Epithelial / HPF: NONE SEEN /HPF (ref <=5)
pH: 6 (ref 5.0–8.0)

## 2020-03-17 LAB — TSH: TSH: 0.28 mIU/L — ABNORMAL LOW (ref 0.40–4.50)

## 2020-03-19 ENCOUNTER — Other Ambulatory Visit: Payer: Self-pay | Admitting: Family Medicine

## 2020-03-23 ENCOUNTER — Ambulatory Visit: Payer: Medicare Other | Admitting: Family Medicine

## 2020-03-24 ENCOUNTER — Telehealth: Payer: Self-pay

## 2020-03-24 DIAGNOSIS — R7989 Other specified abnormal findings of blood chemistry: Secondary | ICD-10-CM

## 2020-03-24 NOTE — Telephone Encounter (Signed)
Per recent lab results:  "Call patient: Her platelet count looks okay as well as kidney function and liver function. So no for specific cause for her easy bruising. Her vitamin 12 looks good. The urine looks okay no sign of blood or protein in the urine. Her thyroid level is off a little bit so we will call the lab and see if we can add a free T4 and free T3. As well as thyroid antibodies."  Add on was not able to be done, I have re ordered these and patient advised to go to lab to have this collected at her convenience.    Signed under covering provider's name

## 2020-04-01 ENCOUNTER — Other Ambulatory Visit: Payer: Self-pay

## 2020-04-01 ENCOUNTER — Ambulatory Visit (INDEPENDENT_AMBULATORY_CARE_PROVIDER_SITE_OTHER): Payer: Medicare Other | Admitting: Family Medicine

## 2020-04-01 VITALS — BP 131/74 | HR 93 | Wt 188.1 lb

## 2020-04-01 DIAGNOSIS — R7989 Other specified abnormal findings of blood chemistry: Secondary | ICD-10-CM | POA: Diagnosis not present

## 2020-04-01 DIAGNOSIS — R030 Elevated blood-pressure reading, without diagnosis of hypertension: Secondary | ICD-10-CM

## 2020-04-01 DIAGNOSIS — Z79899 Other long term (current) drug therapy: Secondary | ICD-10-CM | POA: Diagnosis not present

## 2020-04-01 LAB — T3, FREE: T3, Free: 3.2 pg/mL (ref 2.3–4.2)

## 2020-04-01 LAB — T4, FREE: Free T4: 1.4 ng/dL (ref 0.8–1.8)

## 2020-04-01 NOTE — Progress Notes (Signed)
Pt is here for blood pressure check. Denies palpitations, chest pains, lightheadedness, shortness of breath or medications problems. Blood pressure reading was 145/94, pulse 100. Pt sat for 10 minutes, blood pressure recheck was 131/74, pulse 93. Pt advised to follow up with provider as directed.

## 2020-04-01 NOTE — Progress Notes (Signed)
Elevated blood pressure without diagnosis of hypertension-repeat BP at goal. Keep regular f/u.    Beatrice Lecher, MD

## 2020-04-07 ENCOUNTER — Other Ambulatory Visit: Payer: Self-pay | Admitting: *Deleted

## 2020-04-07 DIAGNOSIS — R7989 Other specified abnormal findings of blood chemistry: Secondary | ICD-10-CM

## 2020-04-30 ENCOUNTER — Other Ambulatory Visit: Payer: Self-pay | Admitting: Family Medicine

## 2020-04-30 DIAGNOSIS — J4540 Moderate persistent asthma, uncomplicated: Secondary | ICD-10-CM

## 2020-05-12 ENCOUNTER — Other Ambulatory Visit: Payer: Self-pay

## 2020-05-12 ENCOUNTER — Ambulatory Visit (INDEPENDENT_AMBULATORY_CARE_PROVIDER_SITE_OTHER): Payer: Medicare Other

## 2020-05-12 ENCOUNTER — Ambulatory Visit (INDEPENDENT_AMBULATORY_CARE_PROVIDER_SITE_OTHER): Payer: Medicare Other | Admitting: Family Medicine

## 2020-05-12 ENCOUNTER — Encounter: Payer: Self-pay | Admitting: Family Medicine

## 2020-05-12 VITALS — BP 137/83 | HR 105 | Ht 63.0 in | Wt 191.0 lb

## 2020-05-12 DIAGNOSIS — R0602 Shortness of breath: Secondary | ICD-10-CM

## 2020-05-12 DIAGNOSIS — R5383 Other fatigue: Secondary | ICD-10-CM | POA: Diagnosis not present

## 2020-05-12 DIAGNOSIS — R600 Localized edema: Secondary | ICD-10-CM

## 2020-05-12 DIAGNOSIS — J9811 Atelectasis: Secondary | ICD-10-CM | POA: Diagnosis not present

## 2020-05-12 MED ORDER — FUROSEMIDE 20 MG PO TABS: 20.0000 mg | ORAL_TABLET | Freq: Every day | ORAL | 0 refills | Status: DC

## 2020-05-12 NOTE — Progress Notes (Signed)
Acute Office Visit  Subjective:    Patient ID: Anna [name], female    DOB: 09-28-1935, 84 y.o.   MRN: [id number]  Chief Complaint  Patient presents with  . Shortness of Breath    HPI Patient is in today for SOB that started suddenly on 8/1. Woke up  With sudden SOB. The next day she had diarrhea all day long and then it stopped. Sine then has been getting gradual LE swelling.  She is still SOB.  No cough.  She feels like her hands a a little swollen as well. No fever, chills or sweats.  She has been using Tylenol and Advil for sleep sometimes but this is not new.  She did not take Xanax at night which she uses every once in a great while for sleep.  She denies again any chest pain.  She denies any pain in her lower legs or extremities.  No prior history of DVT she does have a history of emphysema.  She is not currently taking any diuretics.  We actually just checked her creatinine which was 0.8 in June.  Her sister here with her today.  She says she is just felt so anxious since this started that she did not feel confident driving on her own today.  Past Medical History:  Diagnosis Date  . Dermatitis    seborrehic scalp  . Hiatal hernia   . History of basal cell carcinoma 07/20/2017   On nose.   Marland Kitchen History of melanoma 07/20/2017   Right upper arm   . Hyperlipidemia   . Macular degeneration     Past Surgical History:  Procedure Laterality Date  . BREAST CYST EXCISION Right 09/1962  . CATARACT EXTRACTION  03/2015  . melonoma Right 06/2016   arm  . MOHS SURGERY Left 06/2016   nose    Family History  Problem Relation Age of Onset  . Hyperlipidemia Sister   . Macular degeneration Sister   . Cancer - Prostate Brother   . Atrial fibrillation Maternal Aunt   . Cancer Mother        pancreatic  . Cancer Father        liver    Social History   Socioeconomic History  . Marital status: Single    Spouse name: Not on file  . Number of children: Not on file  . Years of  education: 12+  . Highest education level: Master's degree (e.g., MA, MS, MEng, MEd, MSW, MBA)  Occupational History  . 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  Tobacco Use  . Smoking status: Never Smoker  . Smokeless tobacco: Never Used  Vaping Use  . Vaping Use: Never used  Substance and Sexual Activity  . Alcohol use: Yes    Alcohol/week: 1.0 standard drink    Types: 1 Glasses of wine per week    Comment: occasionally  . Drug use: No  . Sexual activity: Never  Other Topics Concern  . Not on file  Social History Narrative   She has a Scientist, water quality in Film/video editor.  He lives alone in a retirement community.  Her sister is Pharmacist, community.    Social Determinants of Health   Financial Resource Strain:   . Difficulty of Paying Living Expenses:   Food Insecurity:   . Worried About Charity fundraiser in the Last Year:   . [place] in the Last Year:  Transportation Needs:   . Film/video editor (Medical):   Marland Kitchen Lack of Transportation (Non-Medical):   Physical Activity: Inactive  . Days of Exercise per Week: 0 days  . Minutes of Exercise per Session: 0 min  Stress:   . Feeling of Stress :   Social Connections: Unknown  . Frequency of Communication with Friends and Family: Not on file  . Frequency of Social Gatherings with Friends and Family: Once a week  . Attends Religious Services: Not on file  . Active Member of Clubs or Organizations: Not on file  . Attends Archivist Meetings: Not on file  . Marital Status: Not on file  Intimate Partner Violence:   . Fear of Current or Ex-Partner:   . Emotionally Abused:   Marland Kitchen Physically Abused:   . Sexually Abused:     Outpatient Medications Prior to Visit  Medication Sig Dispense Refill  . albuterol (VENTOLIN HFA) 108 (90 Base) MCG/ACT inhaler Inhale 2 puffs into the lungs 2 (two) times daily.     Marland Kitchen ALPRAZolam (XANAX) 0.25 MG tablet Take 1 tablet  (0.25 mg total) by mouth at bedtime as needed for sleep. 30 tablet 0  . AMBULATORY NON FORMULARY MEDICATION Take 5 mLs by mouth 2 (two) times daily. Swish 5 ML by mouth for 30 seconds and spit twice daily for two weeks  3  . ammonium lactate (AMLACTIN) 12 % cream Apply topically as needed for dry skin.    . Bevacizumab 2.75 MG/0.11ML SOSY Inject into the eye.    . calcium citrate-vitamin D (CITRACAL+D) 315-200 MG-UNIT tablet Take 1 tablet by mouth 2 (two) times daily.    . COD LIVER OIL PO Take 1 mL by mouth daily.    . Flaxseed Oil OIL 1,000 mg.    . Fluticasone-Salmeterol (ADVAIR) 250-50 MCG/DOSE AEPB Inhale 1 puff into the lungs 2 (two) times daily. 60 each 5  . ketoconazole (NIZORAL) 2 % shampoo USE THREE TIMES A WEEK AS DIRECTED    . latanoprost (XALATAN) 0.005 % ophthalmic solution Place 1 drop into both eyes daily.    Marland Kitchen lovastatin (MEVACOR) 40 MG tablet TAKE ONE TABLET BY MOUTH NIGHTLY 90 tablet 3  . meclizine (ANTIVERT) 25 MG tablet Take 1 tablet (25 mg total) by mouth 3 (three) times daily as needed for dizziness. 30 tablet 0  . Multiple Vitamins-Minerals (PRESERVISION AREDS 2) CAPS Take by mouth.    Marland Kitchen MYRBETRIQ 25 MG TB24 tablet Take 1 tablet (25 mg total) by mouth daily. (Patient taking differently: Take 25 mg by mouth as needed. ) 30 tablet 11  . omeprazole (PRILOSEC) 40 MG capsule Take 1 capsule (40 mg total) by mouth daily. 90 capsule 3  . sucralfate (CARAFATE) 1 g tablet TAKE 1 TABLET BY MOUTH  TWICE DAILY 180 tablet 3  . timolol (BETIMOL) 0.5 % ophthalmic solution Place 1 drop into the left eye 2 (two) times daily.    . timolol (TIMOPTIC) 0.5 % ophthalmic solution     . TURMERIC PO Take 1,000 mg by mouth.    Marland Kitchen UNABLE TO FIND Right eye injection every 5-6 weeks for Macular Degeneration    . vitamin B-12 (CYANOCOBALAMIN) 1000 MCG tablet Take 1,000 mcg by mouth daily.     Marland Kitchen Lysine 500 MG CAPS Take 1 capsule by mouth daily.     No facility-administered medications prior to visit.     Allergies  Allergen Reactions  . Aleve [Naproxen Sodium]     Causes elevation in  cholesterol   . Azithromycin Rash    Review of Systems     Objective:    Physical Exam Constitutional:      Appearance: She is well-developed.  HENT:     Head: Normocephalic and atraumatic.  Cardiovascular:     Rate and Rhythm: Normal rate and regular rhythm.     Heart sounds: Normal heart sounds.  Pulmonary:     Effort: Pulmonary effort is normal.     Breath sounds: Normal breath sounds.  Abdominal:     General: Abdomen is flat. Bowel sounds are normal. There is no distension.     Palpations: Abdomen is soft. There is no mass.     Tenderness: There is no abdominal tenderness.  Musculoskeletal:     Comments: 1+ pitting edema both lower extremities more swollen on the tops of the feet as well.  Dorsal pedal pulse 2+ on the left foot and 1+ on the right foot.  No significant rash or skin breaks etc. Goes were all the way up to her knees bilaterally.  Skin:    General: Skin is warm and dry.  Neurological:     Mental Status: She is alert and oriented to person, place, and time.  Psychiatric:        Behavior: Behavior normal.     BP 137/83   Pulse (!) 105   Ht 5\' 3"  (1.6 m)   Wt 191 lb (86.6 kg)   SpO2 94%   BMI 33.83 kg/m  Wt Readings from Last 3 Encounters:  05/12/20 191 lb (86.6 kg)  04/01/20 188 lb 1.9 oz (85.3 kg)  03/16/20 190 lb (86.2 kg)    Health Maintenance Due  Topic Date Due  . INFLUENZA VACCINE  04/26/2020    There are no preventive care reminders to display for this patient.   Lab Results  Component Value Date   TSH 0.28 (L) 03/16/2020   Lab Results  Component Value Date   WBC 11.3 (H) 03/16/2020   HGB 14.9 03/16/2020   HCT 44.2 03/16/2020   MCV 91.5 03/16/2020   PLT 217 03/16/2020   Lab Results  Component Value Date   NA 141 03/16/2020   K 4.4 03/16/2020   CO2 24 03/16/2020   GLUCOSE 113 (H) 03/16/2020   BUN 10 03/16/2020   CREATININE 0.83  03/16/2020   BILITOT 0.8 03/16/2020   ALKPHOS 63 11/14/2016   AST 19 03/16/2020   ALT 10 03/16/2020   PROT 6.1 03/16/2020   CALCIUM 9.8 03/16/2020   Lab Results  Component Value Date   CHOL 170 08/09/2019   Lab Results  Component Value Date   HDL 57 08/09/2019   Lab Results  Component Value Date   LDLCALC 91 08/09/2019   Lab Results  Component Value Date   TRIG 122 08/09/2019   Lab Results  Component Value Date   CHOLHDL 3.0 08/09/2019   Lab Results  Component Value Date   HGBA1C 5.5 03/16/2020       Assessment & Plan:   Problem List Items Addressed This Visit    None    Visit Diagnoses    Bilateral lower extremity edema    -  Primary   Relevant Orders   COMPLETE METABOLIC PANEL WITH GFR   TSH   B Nat Peptide   DG Chest 2 View   CBC with Differential/Platelet   SOB (shortness of breath)       Relevant Orders   COMPLETE METABOLIC PANEL WITH GFR  TSH   B Nat Peptide   DG Chest 2 View   CBC with Differential/Platelet     Sudden shortness of breath with onset of low extremity edema-consider diagnoses to include congestive heart failure.  Will check for renal failure, liver failure, thyroid disorder, electrolyte disbalance, anemia etc.  Also get a BMP today.  We will go ahead and start furosemide encouraged her to take a dose this evening when she gets home and then again tomorrow morning heavily will have the labs and possibly chest x-ray back by then.  She did have normal renal function in June I feel safe to do that.  Meds ordered this encounter  Medications  . furosemide (LASIX) 20 MG tablet    Sig: Take 1-2 tablets (20-40 mg total) by mouth daily.    Dispense:  20 tablet    Refill:  0   Time spent in encounter 40 minutes.    Beatrice Lecher, MD

## 2020-05-12 NOTE — Progress Notes (Signed)
Pt reports that on 8/1(2 weeks ago) she began experiencing sxs of SOB, shallow breathing (x1day), fatigue, bilateral swelling of L and R lower legs,ankles and feet. Anxiousness, diarrhea (x1day). She denies any CP, rapid heart beat, skipped beats,headaches, f/s/c/n/v. No

## 2020-05-13 ENCOUNTER — Other Ambulatory Visit: Payer: Self-pay | Admitting: *Deleted

## 2020-05-13 ENCOUNTER — Encounter: Payer: Self-pay | Admitting: Family Medicine

## 2020-05-13 ENCOUNTER — Telehealth: Payer: Self-pay | Admitting: *Deleted

## 2020-05-13 DIAGNOSIS — I700 Atherosclerosis of aorta: Secondary | ICD-10-CM | POA: Insufficient documentation

## 2020-05-13 DIAGNOSIS — R0602 Shortness of breath: Secondary | ICD-10-CM

## 2020-05-13 DIAGNOSIS — R600 Localized edema: Secondary | ICD-10-CM | POA: Diagnosis not present

## 2020-05-13 LAB — COMPLETE METABOLIC PANEL WITH GFR
AG Ratio: 2.2 (calc) (ref 1.0–2.5)
ALT: 11 U/L (ref 6–29)
AST: 28 U/L (ref 10–35)
Albumin: 4.1 g/dL (ref 3.6–5.1)
Alkaline phosphatase (APISO): 61 U/L (ref 37–153)
BUN: 9 mg/dL (ref 7–25)
CO2: 24 mmol/L (ref 20–32)
Calcium: 9 mg/dL (ref 8.6–10.4)
Chloride: 104 mmol/L (ref 98–110)
Creat: 0.74 mg/dL (ref 0.60–0.88)
GFR, Est African American: 87 mL/min/{1.73_m2} (ref >=60)
GFR, Est Non African American: 75 mL/min/{1.73_m2} (ref >=60)
Globulin: 1.9 g/dL (calc) (ref 1.9–3.7)
Glucose, Bld: 112 mg/dL — ABNORMAL HIGH (ref 65–99)
Potassium: 4.1 mmol/L (ref 3.5–5.3)
Sodium: 139 mmol/L (ref 135–146)
Total Bilirubin: 0.9 mg/dL (ref 0.2–1.2)
Total Protein: 6 g/dL — ABNORMAL LOW (ref 6.1–8.1)

## 2020-05-13 LAB — CBC WITH DIFFERENTIAL/PLATELET
Absolute Monocytes: 755 cells/uL (ref 200–950)
Basophils Absolute: 38 cells/uL (ref 0–200)
Basophils Relative: 0.6 %
Eosinophils Absolute: 474 cells/uL (ref 15–500)
Eosinophils Relative: 7.4 %
HCT: 45.5 % — ABNORMAL HIGH (ref 35.0–45.0)
Hemoglobin: 15 g/dL (ref 11.7–15.5)
Lymphs Abs: 1331 cells/uL (ref 850–3900)
MCH: 30.1 pg (ref 27.0–33.0)
MCHC: 33 g/dL (ref 32.0–36.0)
MCV: 91.2 fL (ref 80.0–100.0)
MPV: 12.5 fL (ref 7.5–12.5)
Monocytes Relative: 11.8 %
Neutro Abs: 3802 cells/uL (ref 1500–7800)
Neutrophils Relative %: 59.4 %
Platelets: 238 10*3/uL (ref 140–400)
RBC: 4.99 10*6/uL (ref 3.80–5.10)
RDW: 13 % (ref 11.0–15.0)
Total Lymphocyte: 20.8 %
WBC: 6.4 10*3/uL (ref 3.8–10.8)

## 2020-05-13 LAB — TIQ-MISC

## 2020-05-13 LAB — TSH: TSH: 0.69 mIU/L (ref 0.40–4.50)

## 2020-05-13 LAB — BRAIN NATRIURETIC PEPTIDE

## 2020-05-13 LAB — EXTRA SPECIMEN

## 2020-05-14 LAB — BRAIN NATRIURETIC PEPTIDE: Brain Natriuretic Peptide: 41 pg/mL (ref <100)

## 2020-05-19 ENCOUNTER — Telehealth: Payer: Self-pay

## 2020-05-19 DIAGNOSIS — R030 Elevated blood-pressure reading, without diagnosis of hypertension: Secondary | ICD-10-CM

## 2020-05-19 DIAGNOSIS — R0602 Shortness of breath: Secondary | ICD-10-CM

## 2020-05-19 DIAGNOSIS — R600 Localized edema: Secondary | ICD-10-CM

## 2020-05-19 NOTE — Telephone Encounter (Signed)
Anna [name] called and wanted to proceed with the echocardiogram.

## 2020-05-19 NOTE — Telephone Encounter (Signed)
Order placed

## 2020-05-22 NOTE — Telephone Encounter (Signed)
error 

## 2020-05-25 ENCOUNTER — Ambulatory Visit (INDEPENDENT_AMBULATORY_CARE_PROVIDER_SITE_OTHER): Payer: Medicare Other | Admitting: Nurse Practitioner

## 2020-05-25 ENCOUNTER — Encounter: Payer: Self-pay | Admitting: Nurse Practitioner

## 2020-05-25 VITALS — BP 138/88 | HR 102 | Temp 97.8°F | Ht 63.0 in | Wt 190.7 lb

## 2020-05-25 DIAGNOSIS — R600 Localized edema: Secondary | ICD-10-CM | POA: Diagnosis not present

## 2020-05-25 LAB — POCT UA - MICROALBUMIN
Albumin/Creatinine Ratio, Urine, POC: <30
Creatinine, POC: 200 mg/dL
Microalbumin Ur, POC: 10 mg/L

## 2020-05-25 MED ORDER — FUROSEMIDE 20 MG PO TABS: 20.0000 mg | ORAL_TABLET | Freq: Every day | ORAL | 0 refills | Status: DC

## 2020-05-25 MED ORDER — AMBULATORY NON FORMULARY MEDICATION: 2 refills | Status: DC

## 2020-05-25 NOTE — Progress Notes (Signed)
Acute Office Visit  Subjective:    Patient ID: Anna [name], female    DOB: 10-09-1935, 84 y.o.   MRN: [id number]  Chief Complaint  Patient presents with  . Edema    Bilateral LEE, saw Dr. Madilyn Fireman for it last week, started Lasix with no relief    HPI Anna [name] is an 84 year old female presenting today for bilateral lower extremity edema that has been going on since 04/26/2020. She reports the onset of edema was sudden and was accompanied by shortness of breath. Her shortness of breath has improved and she is only experiencing intermittent smptoms of this, but she reports her edema has progressively continued to worsen.   She was seen by her PCP on 08/17 for this issue and had an extensive workup performed including labs and chest xray. This was all found to be within normal limits. Kidney and liver function were not decreased or elevated and BNP was normal. She was started on Lasix 20-40mg  per day to see if this improved the edema and shortness of breath. She was also referred to cardiology for an ECHO- this is scheduled for 09/22.   She reports that her first dose of lasix resulted in increased urinary output, but subsequent doses did not seem to cause her much increased urination. She reports that she only took the 40mg  in a single dose one time, most of her dosing was 20mg  several hours apart.   She is concerned today because her legs are still swelling (left greater than right) and it is mildly painful in the ankle where the swelling is at its worst. She reports that when she wakes up in the morning the swelling is gone, but by afternoon her feet are considerably swollen. She does report being on her feet during the day running errands and working around the home, but she does stop intermittently to rest and she states that she always props her feet up when she is seated.   She denies frothy sputum, increased cough, increased shortness of breath, decreased urination, palpitations,  chest pain, pain in the calves. She also denies redness, red streaks, warmth, or pain in her legs.   Past Medical History:  Diagnosis Date  . Dermatitis    seborrehic scalp  . Hiatal hernia   . History of basal cell carcinoma 07/20/2017   On nose.   Marland Kitchen History of melanoma 07/20/2017   Right upper arm   . Hyperlipidemia   . Macular degeneration     Past Surgical History:  Procedure Laterality Date  . BREAST CYST EXCISION Right 09/1962  . CATARACT EXTRACTION  03/2015  . melonoma Right 06/2016   arm  . MOHS SURGERY Left 06/2016   nose    Family History  Problem Relation Age of Onset  . Hyperlipidemia Sister   . Macular degeneration Sister   . Cancer - Prostate Brother   . Atrial fibrillation Maternal Aunt   . Cancer Mother        pancreatic  . Cancer Father        liver    Social History   Socioeconomic History  . Marital status: Single    Spouse name: Not on file  . Number of children: Not on file  . Years of education: 12+  . Highest education level: Master's degree (e.g., MA, MS, MEng, MEd, MSW, MBA)  Occupational History  . 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  Tobacco  Use  . Smoking status: Never Smoker  . Smokeless tobacco: Never Used  Vaping Use  . Vaping Use: Never used  Substance and Sexual Activity  . Alcohol use: Yes    Alcohol/week: 1.0 standard drink    Types: 1 Glasses of wine per week    Comment: occasionally  . Drug use: No  . Sexual activity: Never  Other Topics Concern  . Not on file  Social History Narrative   She has a Scientist, water quality in Film/video editor.  He lives alone in a retirement community.  Her sister is Pharmacist, community.    Social Determinants of Health   Financial Resource Strain:   . Difficulty of Paying Living Expenses: Not on file  Food Insecurity:   . Worried About Charity fundraiser in the Last Year: Not on file  . Ran Out of Food in the Last Year: Not on  file  Transportation Needs:   . Lack of Transportation (Medical): Not on file  . Lack of Transportation (Non-Medical): Not on file  Physical Activity: Inactive  . Days of Exercise per Week: 0 days  . Minutes of Exercise per Session: 0 min  Stress:   . Feeling of Stress : Not on file  Social Connections: Unknown  . Frequency of Communication with Friends and Family: Not on file  . Frequency of Social Gatherings with Friends and Family: Once a week  . Attends Religious Services: Not on file  . Active Member of Clubs or Organizations: Not on file  . Attends Archivist Meetings: Not on file  . Marital Status: Not on file  Intimate Partner Violence:   . Fear of Current or Ex-Partner: Not on file  . Emotionally Abused: Not on file  . Physically Abused: Not on file  . Sexually Abused: Not on file    Outpatient Medications Prior to Visit  Medication Sig Dispense Refill  . albuterol (VENTOLIN HFA) 108 (90 Base) MCG/ACT inhaler Inhale 2 puffs into the lungs 2 (two) times daily.     Marland Kitchen ALPRAZolam (XANAX) 0.25 MG tablet Take 1 tablet (0.25 mg total) by mouth at bedtime as needed for sleep. 30 tablet 0  . AMBULATORY NON FORMULARY MEDICATION Take 5 mLs by mouth 2 (two) times daily. Swish 5 ML by mouth for 30 seconds and spit twice daily for two weeks  3  . ammonium lactate (AMLACTIN) 12 % cream Apply topically as needed for dry skin.    . Bevacizumab 2.75 MG/0.11ML SOSY Inject into the eye.    . calcium citrate-vitamin D (CITRACAL+D) 315-200 MG-UNIT tablet Take 1 tablet by mouth 2 (two) times daily.    . COD LIVER OIL PO Take 1 mL by mouth daily.    . Flaxseed Oil OIL 1,000 mg.    . Fluticasone-Salmeterol (ADVAIR) 250-50 MCG/DOSE AEPB Inhale 1 puff into the lungs 2 (two) times daily. 60 each 5  . ketoconazole (NIZORAL) 2 % shampoo USE THREE TIMES A WEEK AS DIRECTED    . latanoprost (XALATAN) 0.005 % ophthalmic solution Place 1 drop into both eyes daily.    Marland Kitchen lovastatin (MEVACOR) 40 MG  tablet TAKE ONE TABLET BY MOUTH NIGHTLY 90 tablet 3  . meclizine (ANTIVERT) 25 MG tablet Take 1 tablet (25 mg total) by mouth 3 (three) times daily as needed for dizziness. 30 tablet 0  . Multiple Vitamins-Minerals (PRESERVISION AREDS 2) CAPS Take by mouth.    Marland Kitchen MYRBETRIQ 25 MG TB24 tablet Take 1 tablet (25 mg total)  by mouth daily. (Patient taking differently: Take 25 mg by mouth as needed. ) 30 tablet 11  . omeprazole (PRILOSEC) 40 MG capsule Take 1 capsule (40 mg total) by mouth daily. 90 capsule 3  . sucralfate (CARAFATE) 1 g tablet TAKE 1 TABLET BY MOUTH  TWICE DAILY 180 tablet 3  . timolol (BETIMOL) 0.5 % ophthalmic solution Place 1 drop into the left eye 2 (two) times daily.    . timolol (TIMOPTIC) 0.5 % ophthalmic solution     . TURMERIC PO Take 1,000 mg by mouth.    Marland Kitchen UNABLE TO FIND Right eye injection every 5-6 weeks for Macular Degeneration    . vitamin B-12 (CYANOCOBALAMIN) 1000 MCG tablet Take 1,000 mcg by mouth daily.     . furosemide (LASIX) 20 MG tablet Take 1-2 tablets (20-40 mg total) by mouth daily. 20 tablet 0   No facility-administered medications prior to visit.    Allergies  Allergen Reactions  . Aleve [Naproxen Sodium]     Causes elevation in cholesterol   . Azithromycin Rash       Objective:    Physical Exam Vitals and nursing note reviewed.  Constitutional:      Appearance: Normal appearance.  HENT:     Head: Normocephalic.  Eyes:     Extraocular Movements: Extraocular movements intact.     Conjunctiva/sclera: Conjunctivae normal.     Pupils: Pupils are equal, round, and reactive to light.  Neck:     Vascular: No carotid bruit.  Cardiovascular:     Rate and Rhythm: Normal rate and regular rhythm.     Pulses:          Popliteal pulses are 2+ on the right side and 2+ on the left side.       Dorsalis pedis pulses are 2+ on the right side and 1+ on the left side.     Heart sounds: Normal heart sounds. No murmur heard.      Comments: Bilateral LE  pitting edema with the following measurements: Left leg:  Distal to knee- 15.5", Calf 15.875", Ankle 10.25", Distal to ankle 12.25" (widest point) Right leg: Distal to knee 15.5", Calf 15.375", Ankle 11.25", Distal to ankle 11.25" (widest point) Pulmonary:     Effort: Pulmonary effort is normal. No respiratory distress.     Breath sounds: Normal breath sounds. No wheezing or rhonchi.  Abdominal:     General: Abdomen is flat. Bowel sounds are normal. There is no distension.     Palpations: Abdomen is soft.     Tenderness: There is no abdominal tenderness. There is no right CVA tenderness, left CVA tenderness or guarding.  Musculoskeletal:        General: Swelling present.     Cervical back: Normal range of motion. No rigidity or tenderness.     Right lower leg: 3+ Edema present.     Left lower leg: 3+ Edema present.  Lymphadenopathy:     Cervical: No cervical adenopathy.  Skin:    General: Skin is warm and dry.  Neurological:     General: No focal deficit present.     Mental Status: She is alert and oriented to person, place, and time.     Motor: No weakness.     Gait: Gait normal.     Deep Tendon Reflexes: Reflexes normal.  Psychiatric:        Mood and Affect: Mood normal.        Behavior: Behavior normal.  Thought Content: Thought content normal.        Judgment: Judgment normal.     BP 138/88   Pulse (!) 102   Temp 97.8 F (36.6 C) (Oral)   Ht 5\' 3"  (1.6 m)   Wt 190 lb 11.2 oz (86.5 kg)   SpO2 93%   BMI 33.78 kg/m  Wt Readings from Last 3 Encounters:  05/25/20 190 lb 11.2 oz (86.5 kg)  05/12/20 191 lb (86.6 kg)  04/01/20 188 lb 1.9 oz (85.3 kg)    There are no preventive care reminders to display for this patient.  There are no preventive care reminders to display for this patient.   Lab Results  Component Value Date   TSH 0.69 05/12/2020   Lab Results  Component Value Date   WBC 6.4 05/12/2020   HGB 15.0 05/12/2020   HCT 45.5 (H) 05/12/2020    MCV 91.2 05/12/2020   PLT 238 05/12/2020   Lab Results  Component Value Date   NA 139 05/12/2020   K 4.1 05/12/2020   CO2 24 05/12/2020   GLUCOSE 112 (H) 05/12/2020   BUN 9 05/12/2020   CREATININE 0.74 05/12/2020   BILITOT 0.9 05/12/2020   ALKPHOS 63 11/14/2016   AST 28 05/12/2020   ALT 11 05/12/2020   PROT 6.0 (L) 05/12/2020   CALCIUM 9.0 05/12/2020   Lab Results  Component Value Date   CHOL 170 08/09/2019   Lab Results  Component Value Date   HDL 57 08/09/2019   Lab Results  Component Value Date   LDLCALC 91 08/09/2019   Lab Results  Component Value Date   TRIG 122 08/09/2019   Lab Results  Component Value Date   CHOLHDL 3.0 08/09/2019   Lab Results  Component Value Date   HGBA1C 5.5 03/16/2020       Assessment & Plan:   Problem List Items Addressed This Visit      Other   Bilateral lower extremity edema - Primary    Bilateral lower extremity edema with +3 pitting edema noted on both legs, ongoing for the past month. No signs of skin breakdown or cellulitis present.  She has tried lasix, but did not see much improvement in the edema with this alone. Recommend increasing lasix to 40mg  dose per day to see if this is helpful.  Discussed the use of compression stockings. Ideally I feel that she would best benefit from 30-4mmHg compression, but do not think that she could feasibly get these on by herself. Recommend she try 20-35mmHg of compression worn every day during waking hours to see if this is helpful. Information provided on compression stockings and how to best apply these.  ECHO scheduled for 09/22.  Will obtain urine microalbumin today to make sure she is not wasting protein and a BMP to monitor kidney and electrolyte function given that she has been on the lasix for a little over a week.  Plan to follow-up with PCP in approximately 4 weeks (after ECHO) or sooner if needed.        Relevant Medications   AMBULATORY NON FORMULARY MEDICATION    furosemide (LASIX) 20 MG tablet   Other Relevant Orders   POCT UA - Microalbumin (Completed)   BASIC METABOLIC PANEL WITH GFR       Meds ordered this encounter  Medications  . AMBULATORY NON FORMULARY MEDICATION    Sig: 20-30 mmHg Knee High Compression Stockings.    Dispense:  2 each    Refill:  2  . furosemide (LASIX) 20 MG tablet    Sig: Take 1-2 tablets (20-40 mg total) by mouth daily.    Dispense:  40 tablet    Refill:  0   Return in about 4 weeks (around 06/22/2020) for Edema.   Orma Render, NP

## 2020-05-25 NOTE — Patient Instructions (Addendum)
I have written a prescription for compression stockings. These are to be worn only during the day and taken off at night while you sleep. They help your veins push the blood back up to your heart and work to keep the fluid from leaking out of the veins.   If the pharmacy cannot supply these, then you can get them at [address], [place] [address] Tel.: [phone]  Your leg measurements are: Left:  Below the knee 15.5" Calf 15 7/8" Ankle 10.25" Below the ankle (widest point) 12.25"  Right: Below the knee 15.5" Calf 15 3/8" Ankle 10.25" Below the ankle (widest point) 11.25"   Chronic Venous Insufficiency Chronic venous insufficiency is a condition where the leg veins cannot effectively pump blood from the legs to the heart. This happens when the vein walls are either stretched, weakened, or damaged, or when the valves inside the vein are damaged. With the right treatment, you should be able to continue with an active life. This condition is also called venous stasis. What are the causes? Common causes of this condition include:  High blood pressure inside the veins (venous hypertension).  Sitting or standing too long, causing increased blood pressure in the leg veins.  A blood clot that blocks blood flow in a vein (deep vein thrombosis, DVT).  Inflammation of a vein (phlebitis) that causes a blood clot to form.  Tumors in the pelvis that cause blood to back up. What increases the risk? The following factors may make you more likely to develop this condition:  Having a family history of this condition.  Obesity.  Pregnancy.  Living without enough regular physical activity or exercise (sedentary lifestyle).  Smoking.  Having a job that requires long periods of standing or sitting in one place.  Being a certain age. Women in their 62s and 31s and men in their 51s are more likely to develop this condition. What are the signs or  symptoms? Symptoms of this condition include:  Veins that are enlarged, bulging, or twisted (varicose veins).  Skin breakdown or ulcers.  Reddened skin or dark discoloration of skin on the leg between the knee and ankle.  Brown, smooth, tight, and painful skin just above the ankle, usually on the inside of the leg (lipodermatosclerosis).  Swelling of the legs. How is this diagnosed? This condition may be diagnosed based on:  Your medical history.  A physical exam.  Tests, such as: ? A procedure that creates an image of a blood vessel and nearby organs and provides information about blood flow through the blood vessel (duplex ultrasound). ? A procedure that tests blood flow (plethysmography). ? A procedure that looks at the veins using X-ray and dye (venogram). How is this treated? The goals of treatment are to help you return to an active life and to minimize pain or disability. Treatment depends on the severity of your condition, and it may include:  Wearing compression stockings. These can help relieve symptoms and help prevent your condition from getting worse. However, they do not cure the condition.  Sclerotherapy. This procedure involves an injection of a solution that shrinks damaged veins.  Surgery. This may involve: ? Removing a diseased vein (vein stripping). ? Cutting off blood flow through the vein (laser ablation surgery). ? Repairing or reconstructing a valve within the affected vein. Follow these instructions at home:      Wear compression stockings as told by your health care provider. These stockings help to prevent  blood clots and reduce swelling in your legs.  Take over-the-counter and prescription medicines only as told by your health care provider.  Stay active by exercising, walking, or doing different activities. Ask your health care provider what activities are safe for you and how much exercise you need.  Drink enough fluid to keep your urine pale  yellow.  Do not use any products that contain nicotine or tobacco, such as cigarettes, e-cigarettes, and chewing tobacco. If you need help quitting, ask your health care provider.  Keep all follow-up visits as told by your health care provider. This is important. Contact a health care provider if you:  Have redness, swelling, or more pain in the affected area.  See a red streak or line that goes up or down from the affected area.  Have skin breakdown or skin loss in the affected area, even if the breakdown is small.  Get an injury in the affected area. Get help right away if:  You get an injury and an open wound in the affected area.  You have: ? Severe pain that does not get better with medicine. ? Sudden numbness or weakness in the foot or ankle below the affected area. ? Trouble moving your foot or ankle. ? A fever. ? Worse or persistent symptoms. ? Chest pain. ? Shortness of breath. Summary  Chronic venous insufficiency is a condition where the leg veins cannot effectively pump blood from the legs to the heart.  Chronic venous insufficiency occurs when the vein walls become stretched, weakened, or damaged, or when valves within the vein are damaged.  Treatment depends on how severe your condition is. It often involves wearing compression stockings and may involve having a procedure.  Make sure you stay active by exercising, walking, or doing different activities. Ask your health care provider what activities are safe for you and how much exercise you need. This information is not intended to replace advice given to you by your health care provider. Make sure you discuss any questions you have with your health care provider. Document Revised: 06/05/2018 Document Reviewed: 06/05/2018 Elsevier Patient Education  [employer].  How to Use Compression Stockings Compression stockings are elastic socks that squeeze the legs. They help increase blood flow (circulation) to  the legs, decrease swelling in the legs, and reduce the chance of developing blood clots in the lower legs. Compression stockings are often used by people who:  Are recovering from surgery.  Have poor circulation in their legs.  Tend to get blood clots in their legs.  Have bulging (varicose) veins.  Sit or stay in bed for long periods of time. Follow instructions from your health care provider about how and when to wear your compression stockings. How to wear compression stockings Before you put on your compression stockings:  Make sure that they are the correct size and degree of compression. If you do not know your size or required grade of compression, ask your health care provider and follow the manufacturer's instructions that come with the stockings.  Make sure that they are clean, dry, and in good condition.  Check them for rips and tears. Do not put them on if they are ripped or torn. Put your stockings on first thing in the morning, before you get out of bed. Keep them on for as long as your health care provider advises. When you are wearing your stockings:  Keep them as smooth as possible. Do not allow them to bunch up. It is  especially important to prevent the stockings from bunching up around your toes or behind your knees.  Do not roll the stockings downward and leave them rolled down. This can decrease blood flow to your leg.  Change them right away if they become wet or dirty. When you take off your stockings, inspect your legs and feet. Check for:  Open sores.  Red spots.  Swelling. General tips  Do not stop wearing compression stockings without talking to your health care provider first.  Wash your stockings every day with mild detergent in cold or warm water. Do not use bleach. Air-dry your stockings or dry them in a clothes dryer on low heat. It may be helpful to have two pairs so that you have a pair to wear while the other is being washed.  Replace your  stockings every 3-6 months.  If skin moisturizing is part of your treatment plan, apply lotion or cream at night so that your skin will be dry when you put on the stockings in the morning. It is harder to put the stockings on when you have lotion on your legs or feet.  Wear nonskid shoes or slip-resistant socks when walking while wearing compression stockings. Contact a health care provider and remove your stockings if you have:  A feeling of pins and needles in your feet or legs.  Open sores, red spots, or other skin changes on your feet or legs.  Swelling or pain that gets worse. Get help right away if you have:  Numbness or tingling in your lower legs that does not get better right after you take the stockings off.  Toes or feet that are unusually cold or turn a bluish color.  A warm or red area on your leg.  New swelling or soreness in your leg.  Shortness of breath.  Chest pain.  A fast or irregular heartbeat.  Light-headedness.  Dizziness. Summary  Compression stockings are elastic socks that squeeze the legs.  They help increase blood flow (circulation) to the legs, decrease swelling in the legs, and reduce the chance of developing blood clots in the lower legs.  Follow instructions from your health care provider about how and when to wear your compression stockings.  Do not stop wearing your compression stockings without talking to your health care provider first. This information is not intended to replace advice given to you by your health care provider. Make sure you discuss any questions you have with your health care provider. Document Revised: 09/14/2017 Document Reviewed: 09/14/2017 Elsevier Patient Education  2020 [employer].

## 2020-05-25 NOTE — Assessment & Plan Note (Signed)
Bilateral lower extremity edema with +3 pitting edema noted on both legs, ongoing for the past month. No signs of skin breakdown or cellulitis present.  She has tried lasix, but did not see much improvement in the edema with this alone. Recommend increasing lasix to 40mg  dose per day to see if this is helpful.  Discussed the use of compression stockings. Ideally I feel that she would best benefit from 30-43mmHg compression, but do not think that she could feasibly get these on by herself. Recommend she try 20-41mmHg of compression worn every day during waking hours to see if this is helpful. Information provided on compression stockings and how to best apply these.  ECHO scheduled for 09/22.  Will obtain urine microalbumin today to make sure she is not wasting protein and a BMP to monitor kidney and electrolyte function given that she has been on the lasix for a little over a week.  Plan to follow-up with PCP in approximately 4 weeks (after ECHO) or sooner if needed.

## 2020-05-26 LAB — BASIC METABOLIC PANEL WITH GFR
BUN: 9 mg/dL (ref 7–25)
CO2: 30 mmol/L (ref 20–32)
Calcium: 9 mg/dL (ref 8.6–10.4)
Chloride: 102 mmol/L (ref 98–110)
Creat: 0.77 mg/dL (ref 0.60–0.88)
GFR, Est African American: 83 mL/min/{1.73_m2} (ref >=60)
GFR, Est Non African American: 72 mL/min/{1.73_m2} (ref >=60)
Glucose, Bld: 103 mg/dL — ABNORMAL HIGH (ref 65–99)
Potassium: 3.8 mmol/L (ref 3.5–5.3)
Sodium: 140 mmol/L (ref 135–146)

## 2020-05-26 NOTE — Progress Notes (Signed)
Labs look good. Kidney and liver function still great and no alteration in electrolytes. Continue the lasix and follow-up with Dr. Madilyn Fireman in about 4 weeks, after the ECHO.

## 2020-06-03 ENCOUNTER — Ambulatory Visit (INDEPENDENT_AMBULATORY_CARE_PROVIDER_SITE_OTHER): Payer: Medicare Other | Admitting: Physician Assistant

## 2020-06-03 ENCOUNTER — Encounter: Payer: Self-pay | Admitting: Physician Assistant

## 2020-06-03 VITALS — BP 132/82 | HR 91 | Ht 63.0 in | Wt 187.0 lb

## 2020-06-03 DIAGNOSIS — R030 Elevated blood-pressure reading, without diagnosis of hypertension: Secondary | ICD-10-CM

## 2020-06-03 DIAGNOSIS — R600 Localized edema: Secondary | ICD-10-CM

## 2020-06-03 DIAGNOSIS — M19012 Primary osteoarthritis, left shoulder: Secondary | ICD-10-CM | POA: Diagnosis not present

## 2020-06-03 MED ORDER — CELECOXIB 200 MG PO CAPS: ORAL_CAPSULE | ORAL | 0 refills | Status: DC

## 2020-06-03 MED ORDER — MELOXICAM 15 MG PO TABS: 15.0000 mg | ORAL_TABLET | Freq: Every day | ORAL | 0 refills | Status: DC

## 2020-06-03 NOTE — Patient Instructions (Signed)
Shoulder Impingement Syndrome Rehab Ask your health care provider which exercises are safe for you. Do exercises exactly as told by your health care provider and adjust them as directed. It is normal to feel mild stretching, pulling, tightness, or discomfort as you do these exercises. Stop right away if you feel sudden pain or your pain gets worse. Do not begin these exercises until told by your health care provider. Stretching and range-of-motion exercise This exercise warms up your muscles and joints and improves the movement and flexibility of your shoulder. This exercise also helps to relieve pain and stiffness. Passive horizontal adduction In passive adduction, you use your other hand to move the injured arm toward your body. The injured arm does not move on its own. In this movement, your arm is moved across your body in the horizontal plane (horizontal adduction). 1. Sit or stand and pull your left / right elbow across your chest, toward your other shoulder. Stop when you feel a gentle stretch in the back of your shoulder and upper arm. ? Keep your arm at shoulder height. ? Keep your arm as close to your body as you comfortably can. 2. Hold for __________ seconds. 3. Slowly return to the starting position. Repeat __________ times. Complete this exercise __________ times a day. Strengthening exercises These exercises build strength and endurance in your shoulder. Endurance is the ability to use your muscles for a long time, even after they get tired. External rotation, isometric This is an exercise in which you press the back of your wrist against a door frame without moving your shoulder joint (isometric). 1. Stand or sit in a doorway, facing the door frame. 2. Bend your left / right elbow and place the back of your wrist against the door frame. Only the back of your wrist should be touching the frame. Keep your upper arm at your side. 3. Gently press your wrist against the door frame, as if  you are trying to push your arm away from your abdomen (external rotation). Press as hard as you are able without pain. ? Avoid shrugging your shoulder while you press your wrist against the door frame. Keep your shoulder blade tucked down toward the middle of your back. 4. Hold for __________ seconds. 5. Slowly release the tension, and relax your muscles completely before you repeat the exercise. Repeat __________ times. Complete this exercise __________ times a day. Internal rotation, isometric This is an exercise in which you press your palm against a door frame without moving your shoulder joint (isometric). 1. Stand or sit in a doorway, facing the door frame. 2. Bend your left / right elbow and place the palm of your hand against the door frame. Only your palm should be touching the frame. Keep your upper arm at your side. 3. Gently press your hand against the door frame, as if you are trying to push your arm toward your abdomen (internal rotation). Press as hard as you are able without pain. ? Avoid shrugging your shoulder while you press your hand against the door frame. Keep your shoulder blade tucked down toward the middle of your back. 4. Hold for __________ seconds. 5. Slowly release the tension, and relax your muscles completely before you repeat the exercise. Repeat __________ times. Complete this exercise __________ times a day. Scapular protraction, supine  1. Lie on your back on a firm surface (supine position). Hold a __________ weight in your left / right hand. 2. Raise your left / right arm straight   into the air so your hand is directly above your shoulder joint. 3. Push the weight into the air so your shoulder (scapula) lifts off the surface that you are lying on. The scapula will push up or forward (protraction). Do not move your head, neck, or back. 4. Hold for __________ seconds. 5. Slowly return to the starting position. Let your muscles relax completely before you repeat  this exercise. Repeat __________ times. Complete this exercise __________ times a day. Scapular retraction  1. Sit in a stable chair without armrests, or stand up. 2. Secure an exercise band to a stable object in front of you so the band is at shoulder height. 3. Hold one end of the exercise band in each hand. Your palms should face down. 4. Squeeze your shoulder blades together (retraction) and move your elbows slightly behind you. Do not shrug your shoulders upward while you do this. 5. Hold for __________ seconds. 6. Slowly return to the starting position. Repeat __________ times. Complete this exercise __________ times a day. Shoulder extension  1. Sit in a stable chair without armrests, or stand up. 2. Secure an exercise band to a stable object in front of you so the band is above shoulder height. 3. Hold one end of the exercise band in each hand. 4. Straighten your elbows and lift your hands up to shoulder height. 5. Squeeze your shoulder blades together and pull your hands down to the sides of your thighs (extension). Stop when your hands are straight down by your sides. Do not let your hands go behind your body. 6. Hold for __________ seconds. 7. Slowly return to the starting position. Repeat __________ times. Complete this exercise __________ times a day. This information is not intended to replace advice given to you by your health care provider. Make sure you discuss any questions you have with your health care provider. Document Revised: 01/04/2019 Document Reviewed: 10/08/2018 Elsevier Patient Education  2020 [employer]  

## 2020-06-03 NOTE — Progress Notes (Signed)
Subjective:    Patient ID: Anna [name], female    DOB: [birth year], 84 y.o.   MRN: [id number]  HPI  Pt is a 84 yo female with osteoarthritis who presents to the clinic with left shoulder pain for last few days. She has history of this shoulder pain due to osteoarthritis but resolved after injection in December 2020 with Dr. Darene [name]. She is concerned because she wants to make sure not related to swelling in legs. She has been wearing compression stockings and been seen twice for edema in legs. Compression stockings help edema along with lasix. She has echo scheduled for 9/22. Pt denies any heart disease in family. No SOB or problems lying flat at night. Admits to eating a lot of TV dinners.   Hx of left shoulder pain. Denies any fall. No recent vaccines. describes pain as dull ache in left shoulder and upper arm. No neck pain or numbness and tingling radiation down arm. She is taking tylenol and advil with some relief. PT has helped in the past.   .. Active Ambulatory Problems    Diagnosis Date Noted  . Chronic fatigue 08/06/2015  . Gastroesophageal reflux disease without esophagitis 08/06/2015  . Glaucoma 08/06/2015  . Hiatal hernia 08/06/2015  . Melanoma of right upper arm ([place]) 08/06/2015  . Mixed hyperlipidemia 08/06/2015  . Rupture of right biceps tendon 08/06/2015  . Biceps tendonitis of both shoulders 08/06/2015  . IFG (impaired fasting glucose) 07/17/2017  . Macular degeneration   . History of melanoma 07/20/2017  . History of basal cell carcinoma 07/20/2017  . OAB (overactive bladder) 08/08/2019  . Insomnia 08/08/2019  . Acute pain of left shoulder 09/05/2019  . Chronic bilateral low back pain without sciatica 09/05/2019  . Bunion, right foot 11/22/2019  . Lichen planus 01/77/9390  . Asthma in adult 11/22/2019  . Mixed restrictive and obstructive lung disease ([place]) 11/22/2019  . Primary osteoarthritis involving multiple joints 11/22/2019  . Spontaneous ecchymoses 03/16/2020  .  Aortic atherosclerosis ([place]) 05/13/2020  . Bilateral lower extremity edema 05/25/2020  . Primary osteoarthritis, left shoulder 06/03/2020   Resolved Ambulatory Problems    Diagnosis Date Noted  . No Resolved Ambulatory Problems   Past Medical History:  Diagnosis Date  . Dermatitis   . Hyperlipidemia      Review of Systems See HPI.     Objective:   Physical Exam Vitals reviewed.  Constitutional:      Appearance: Normal appearance. She is obese.  HENT:     Head: Normocephalic.  Cardiovascular:     Rate and Rhythm: Normal rate and regular rhythm.     Pulses: Normal pulses.     Heart sounds: No murmur heard.   Pulmonary:     Effort: Pulmonary effort is normal.     Breath sounds: Normal breath sounds.  Musculoskeletal:     Right lower leg: Edema present.     Left lower leg: Edema present.     Comments: Bilateral compression stockings.   Left shoulder:  Tenderness to palpation to anterior shoulder over AC joint.  No tenderness over insertion of bicep tendon.  Negative speeds and Yergason.  Pain with hawkins and external ROM.  Negative drop test.  Full ROM.  5/5 strength.    Neurological:     General: No focal deficit present.     Mental Status: She is alert and oriented to person, place, and time.  Psychiatric:        Mood and Affect: Mood normal.  Assessment & Plan:  Marland KitchenMarland KitchenTyannah was seen today for arm pain.  Diagnoses and all orders for this visit:  Primary osteoarthritis, left shoulder -     celecoxib (CELEBREX) 200 MG capsule; One tablet once a day.  Bilateral lower extremity edema  Elevated blood pressure reading  Other orders -     Discontinue: meloxicam (MOBIC) 15 MG tablet; Take 1 tablet (15 mg total) by mouth daily.   I suspect this is OA flare in left shoulder and not associated with bilateral leg swelling at all. Swelling is improving with compression stockings. All labs look good. No protein and kidney/liver/electrolytes look good.  She does have echo on 9/22. No family hx of heart disease. Discussed decreasing TV dinners as they have a lot of salt in them.   Hx of OA in left shoulder based on xrays from 2018 and 2020. Consider making appt for another injection with Dr. Darene [name]. Kermit Balo kidney function. Ok to take anti-inflammatory for next week or so. Originally sent mobic decided to do celebrex due to upcoming evaluation of heart with echo. Discussed with patient to watch for any worsening swelling of extremities and if notice to stop celebrex. Exercises given to start up again.   BP elevated but improved on 2nd recheck.

## 2020-06-17 ENCOUNTER — Ambulatory Visit (HOSPITAL_BASED_OUTPATIENT_CLINIC_OR_DEPARTMENT_OTHER)
Admission: RE | Admit: 2020-06-17 | Discharge: 2020-06-17 | Disposition: A | Discharge status: Home / Self Care | Payer: Medicare Other | Source: Ambulatory Visit | Attending: Family Medicine | Admitting: Family Medicine

## 2020-06-17 ENCOUNTER — Other Ambulatory Visit: Payer: Self-pay

## 2020-06-17 DIAGNOSIS — R0602 Shortness of breath: Secondary | ICD-10-CM | POA: Insufficient documentation

## 2020-06-17 DIAGNOSIS — R600 Localized edema: Secondary | ICD-10-CM

## 2020-06-17 DIAGNOSIS — R030 Elevated blood-pressure reading, without diagnosis of hypertension: Secondary | ICD-10-CM

## 2020-06-17 LAB — ECHOCARDIOGRAM COMPLETE
Area-P 1/2: 3.65 cm2
S' Lateral: 2.3 cm

## 2020-06-22 ENCOUNTER — Encounter: Payer: Self-pay | Admitting: Nurse Practitioner

## 2020-06-22 ENCOUNTER — Ambulatory Visit (INDEPENDENT_AMBULATORY_CARE_PROVIDER_SITE_OTHER): Payer: Medicare Other | Admitting: Nurse Practitioner

## 2020-06-22 ENCOUNTER — Other Ambulatory Visit: Payer: Self-pay

## 2020-06-22 VITALS — BP 135/92 | HR 87 | Temp 98.0°F | Ht 63.0 in | Wt 186.5 lb

## 2020-06-22 DIAGNOSIS — I872 Venous insufficiency (chronic) (peripheral): Secondary | ICD-10-CM | POA: Diagnosis not present

## 2020-06-22 DIAGNOSIS — I700 Atherosclerosis of aorta: Secondary | ICD-10-CM

## 2020-06-22 DIAGNOSIS — M545 Low back pain, unspecified: Secondary | ICD-10-CM

## 2020-06-22 DIAGNOSIS — Z23 Encounter for immunization: Secondary | ICD-10-CM | POA: Diagnosis not present

## 2020-06-22 DIAGNOSIS — G5711 Meralgia paresthetica, right lower limb: Secondary | ICD-10-CM | POA: Diagnosis not present

## 2020-06-22 DIAGNOSIS — G8929 Other chronic pain: Secondary | ICD-10-CM

## 2020-06-22 MED ORDER — FUROSEMIDE 20 MG PO TABS: 20.0000 mg | ORAL_TABLET | ORAL | 3 refills | Status: DC | PRN

## 2020-06-22 MED ORDER — GABAPENTIN 100 MG PO CAPS: ORAL_CAPSULE | ORAL | 3 refills | Status: DC

## 2020-06-22 NOTE — Assessment & Plan Note (Signed)
Bilateral lower extremity edema worsening throughout the day and improving in the morning. Patient has had significant improvement with utilization of compression stockings. We did discuss continuing the compression stockings and monitoring for a low-sodium diet and elevating her lower extremities when she is seated. There was some concern that the compression stocking had caused some of the lateral thigh pain she was experiencing however I feel that this is a completely different episode and use of the compression stockings will not interfere with this. Her echocardiogram was fairly unremarkable which is encouraging. Did discuss the option of as needed furosemide to use when lower extremity swelling is significantly worsened. Follow-up if symptoms worsen or fail to improve.

## 2020-06-22 NOTE — Assessment & Plan Note (Signed)
Symptoms and presentation consistent with meralgia paresthetica of the right side. This is most likely related to increased flexion and use of the right hip related to putting the compression stocking on, if there is a relation at all. Discussed with the patient the option of utilizing heat and ice to the hip and buttock area, continued meloxicam or Celebrex, and will also add gabapentin for bedtime with the option to use low-dose during the day for nerve type pain. Instructions for exercises also given which may help eliminate low back pain and nerve compression. If symptoms persist beyond 2 weeks will obtain imaging and may recommend formal physical therapy.

## 2020-06-22 NOTE — Patient Instructions (Addendum)
We will try a medication called Gabapentin to help with the nerve pain while the nerve is so angry and irritated. You can take this medication up to three times a day. I recommend you start by taking this at night time and you may add doses during the day, if needed.   I recommend start with 300mg  (3 pills) at night. You may take 100mg  (1 pill) up to 2 times during the day.   You can continue to take the meloxicam or celebrex for pain. You may also take Advil for pain if you are not taking meloxicam or celebrex as these are all the same medication type.     MERALGIA PARESTHETICA What is meralgia paresthetica?--Meralgia paresthetica is a condition that causes pain, tingling, and numbness in the outer thigh. It happens when a nerve in that area gets squeezed or compressed. Different things can cause meralgia paresthetica. They include pregnancy, wearing tight belts or waistbands, leaning the thigh on something for a long time, and injury to the area. Sometimes, it can happen after surgery in the area. Meralgia paresthetica is more common in people who have diabetes or obesity, and in older people. It is not a serious condition, and it usually goes away on its own. What are the symptoms of meralgia paresthetica?--The main symptoms involve the upper, outer thigh. They can include: ?Pain - Pain can be burning or stinging. ?Tingling - This can feel like "pins and needles" in the area. ?Numbness ?Feeling extra sensitive to touch - Even light touch, like the feelings of clothing on the skin, might be unpleasant. ?Itching Symptoms usually affect only 1 of the thighs.  How is meralgia paresthetica treated?--Meralgia paresthetica usually goes away on its own within a few weeks or months. If your symptoms bother you, it might help to: ?Avoid wearing tight belts or clothing with a tight waistband. These things can put pressure on the nerve that runs from your lower spine to your thigh. ?Consider  losing weight if you are overweight. Your doctor or nurse can talk to you about healthy ways to lose weight if this is something you want to do. ?Take pain-relieving medicines such as acetaminophen (brand name: Tylenol) or ibuprofen (sample brand names: Advil, Motrin). If your symptoms last for longer than 1 or 2 months, tell your doctor or nurse. They might suggest trying other treatments, such as pain medicines or a shot of medicine to numb the area. Sometimes surgery is recommended for people with severe symptoms, but this is rare.    Chronic Venous Insufficiency Chronic venous insufficiency is a condition where the leg veins cannot effectively pump blood from the legs to the heart. This happens when the vein walls are either stretched, weakened, or damaged, or when the valves inside the vein are damaged. With the right treatment, you should be able to continue with an active life. This condition is also called venous stasis. What are the causes? Common causes of this condition include:  High blood pressure inside the veins (venous hypertension).  Sitting or standing too long, causing increased blood pressure in the leg veins.  A blood clot that blocks blood flow in a vein (deep vein thrombosis, DVT).  Inflammation of a vein (phlebitis) that causes a blood clot to form.  Tumors in the pelvis that cause blood to back up. What increases the risk? The following factors may make you more likely to develop this condition:  Having a family history of this condition.  Obesity.  Pregnancy.  Living without enough regular physical activity or exercise (sedentary lifestyle).  Smoking.  Having a job that requires long periods of standing or sitting in one place.  Being a certain age. Women in their 29s and 38s and men in their 77s are more likely to develop this condition. What are the signs or symptoms? Symptoms of this condition include:  Veins that are enlarged, bulging, or twisted  (varicose veins).  Skin breakdown or ulcers.  Reddened skin or dark discoloration of skin on the leg between the knee and ankle.  Brown, smooth, tight, and painful skin just above the ankle, usually on the inside of the leg (lipodermatosclerosis).  Swelling of the legs. How is this diagnosed? This condition may be diagnosed based on:  Your medical history.  A physical exam.  Tests, such as: ? A procedure that creates an image of a blood vessel and nearby organs and provides information about blood flow through the blood vessel (duplex ultrasound). ? A procedure that tests blood flow (plethysmography). ? A procedure that looks at the veins using X-ray and dye (venogram). How is this treated? The goals of treatment are to help you return to an active life and to minimize pain or disability. Treatment depends on the severity of your condition, and it may include:  Wearing compression stockings. These can help relieve symptoms and help prevent your condition from getting worse. However, they do not cure the condition.  Sclerotherapy. This procedure involves an injection of a solution that shrinks damaged veins.  Surgery. This may involve: ? Removing a diseased vein (vein stripping). ? Cutting off blood flow through the vein (laser ablation surgery). ? Repairing or reconstructing a valve within the affected vein. Follow these instructions at home:      Wear compression stockings as told by your health care provider. These stockings help to prevent blood clots and reduce swelling in your legs.  Take over-the-counter and prescription medicines only as told by your health care provider.  Stay active by exercising, walking, or doing different activities. Ask your health care provider what activities are safe for you and how much exercise you need.  Drink enough fluid to keep your urine pale yellow.  Do not use any products that contain nicotine or tobacco, such as cigarettes,  e-cigarettes, and chewing tobacco. If you need help quitting, ask your health care provider.  Keep all follow-up visits as told by your health care provider. This is important. Contact a health care provider if you:  Have redness, swelling, or more pain in the affected area.  See a red streak or line that goes up or down from the affected area.  Have skin breakdown or skin loss in the affected area, even if the breakdown is small.  Get an injury in the affected area. Get help right away if:  You get an injury and an open wound in the affected area.  You have: ? Severe pain that does not get better with medicine. ? Sudden numbness or weakness in the foot or ankle below the affected area. ? Trouble moving your foot or ankle. ? A fever. ? Worse or persistent symptoms. ? Chest pain. ? Shortness of breath. Summary  Chronic venous insufficiency is a condition where the leg veins cannot effectively pump blood from the legs to the heart.  Chronic venous insufficiency occurs when the vein walls become stretched, weakened, or damaged, or when valves within the vein are damaged.  Treatment depends on how severe your condition is.  It often involves wearing compression stockings and may involve having a procedure.  Make sure you stay active by exercising, walking, or doing different activities. Ask your health care provider what activities are safe for you and how much exercise you need. This information is not intended to replace advice given to you by your health care provider. Make sure you discuss any questions you have with your health care provider. Document Revised: 06/05/2018 Document Reviewed: 06/05/2018 Elsevier Patient Education  [place].   Atherosclerosis  Atherosclerosis is narrowing and hardening of the arteries. Arteries are blood vessels that carry blood from the heart to all parts of the body. This blood contains oxygen. Arteries can become narrow or clogged  with a buildup of fat, cholesterol, calcium, and other substances (plaque). Plaque decreases the amount of blood that can flow through the artery. Atherosclerosis can affect any artery in the body, including:  Heart arteries (coronary artery disease). This may cause a heart attack.  Brain arteries. This may cause a stroke (cerebrovascular accident).  Leg, arm, and pelvis arteries (peripheral artery disease). This may cause pain and numbness.  Kidney arteries. This may cause kidney (renal) failure. Treatment may slow the disease and prevent further damage to the heart, brain, peripheral arteries, and kidneys. What are the causes? Atherosclerosis develops slowly over many years. The inner layers of your arteries become damaged and allow the gradual buildup of plaque. The exact cause of atherosclerosis is not fully understood. Symptoms of atherosclerosis do not occur until the artery becomes narrow or blocked. What increases the risk? The following factors may make you more likely to develop this condition:  High blood pressure.  High cholesterol.  Being middle-aged or older.  Having a family history of atherosclerosis.  Having high blood fats (triglycerides).  Diabetes.  Being overweight.  Smoking tobacco.  Not exercising enough (sedentary lifestyle).  Having a substance in the blood called C-reactive protein (CRP). This is a sign of increased levels of inflammation in the body.  Sleep apnea.  Being stressed.  Drinking too much alcohol. What are the signs or symptoms? This condition may not cause any symptoms. If you have symptoms, they are caused by damage to an area of your body that is not getting enough blood.  Coronary artery disease may cause chest pain and shortness of breath.  Decreased blood supply to your brain may cause a stroke. Signs of a stroke may include sudden: ? Weakness on one side of the body. ? Confusion. ? Changes in vision. ? Inability to speak  or understand speech. ? Loss of balance, coordination, or the ability to walk. ? Severe headache. ? Loss of consciousness.  Peripheral arterial disease may cause pain and numbness, often in the legs and hips.  Renal failure may cause fatigue, nausea, swelling, and itchy skin. How is this diagnosed? This condition is diagnosed based on your medical history and a physical exam. During the exam:  Your health care provider will: ? Check your pulse in different places. ? Listen for a "whooshing" sound over your arteries (bruit).  You may have tests, such as: ? Blood tests to check your levels of cholesterol, triglycerides, and CRP. ? Electrocardiogram (ECG) to check for heart damage. ? Chest X-ray to see if you have an enlarged heart, which is a sign of heart failure. ? Stress test to see how your heart reacts to exercise. ? Echocardiogram to get images of the inside of your heart. ? Ankle-brachial index to compare blood pressure  in your arms to blood pressure in your ankles. ? Ultrasound of your peripheral arteries to check blood flow. ? CT scan to check for damage to your heart or brain. ? X-rays of blood vessels after dye has been injected (angiogram) to check blood flow. How is this treated? Treatment starts with lifestyle changes, which may include:  Changing your diet.  Losing weight.  Reducing stress.  Exercising and being physically active more regularly.  Not smoking. You may also need medicine to:  Lower triglycerides and cholesterol.  Control blood pressure.  Prevent blood clots.  Lower inflammation in your body.  Control your blood sugar. Sometimes, surgery is needed to:  Remove plaque from an artery (endarterectomy).  Open or widen a narrowed heart artery (angioplasty).  Create a new path for your blood with one of these procedures: ? Heart (coronary) artery bypass graft surgery. ? Peripheral artery bypass graft surgery. Follow these instructions at  home: Eating and drinking   Eat a heart-healthy diet. Talk with your health care provider or a diet and nutrition specialist (dietitian) if you need help. A heart-healthy diet involves: ? Limiting unhealthy fats and increasing healthy fats. Some examples of healthy fats are olive oil and canola oil. ? Eating plant-based foods, such as fruits, vegetables, nuts, whole grains, and legumes (such as peas and lentils).  Limit alcohol intake to no more than 1 drink a day for nonpregnant women and 2 drinks a day for men. One drink equals 12 oz of beer, 5 oz of wine, or 1 oz of hard liquor. Lifestyle  Follow an exercise program as told by your health care provider.  Maintain a healthy weight. Lose weight if your health care provider says that you need to do that.  Rest when you are tired.  Learn to manage your stress.  Do not use any products that contain nicotine or tobacco, such as cigarettes and e-cigarettes. If you need help quitting, ask your health care provider.  Do not abuse drugs. General instructions  Take over-the-counter and prescription medicines only as told by your health care provider.  Manage other health conditions as told by your health care provider.  Keep all follow-up visits as told by your health care provider. This is important. Contact a health care provider if:  You have chest pain or discomfort. This includes squeezing chest pain that may feel like indigestion (angina).  You have shortness of breath.  You have an irregular heartbeat.  You have unexplained fatigue.  You have unexplained pain or numbness in an arm, leg, or hip.  You have nausea, swelling of your hands or feet, and itchy skin. Get help right away if:  You have any symptoms of a heart attack, such as: ? Chest pain. ? Shortness of breath. ? Pain in your neck, jaw, arms, back, or stomach. ? Cold sweat. ? Nausea. ? Light-headedness.  You have any symptoms of a stroke. "BE FAST" is an  easy way to remember the main warning signs of a stroke: ? B - Balance. Signs are dizziness, sudden trouble walking, or loss of balance. ? E - Eyes. Signs are trouble seeing or a sudden change in vision. ? F - Face. Signs are sudden weakness or numbness of the face, or the face or eyelid drooping on one side. ? A - Arms. Signs are weakness or numbness in an arm. This happens suddenly and usually on one side of the body. ? S - Speech. Signs are sudden trouble speaking, slurred  speech, or trouble understanding what people say. ? T - Time. Time to call emergency services. Write down what time symptoms started.  You have other signs of a stroke, such as: ? A sudden, severe headache with no known cause. ? Nausea or vomiting. ? Seizure. These symptoms may represent a serious problem that is an emergency. Do not wait to see if the symptoms will go away. Get medical help right away. Call your local emergency services (911 in the [place] Do not drive yourself to the hospital. Summary  Atherosclerosis is narrowing and hardening of the arteries.  Arteries can become narrow or clogged with a buildup of fat, cholesterol, calcium, and other substances (plaque).  This condition may not cause any symptoms. If you do have symptoms, they are caused by damage to an area of your body that is not getting enough blood.  Treatment may include lifestyle changes and medicines. In some cases, surgery is needed. This information is not intended to replace advice given to you by your health care provider. Make sure you discuss any questions you have with your health care provider. Document Revised: 12/22/2017 Document Reviewed: 05/18/2017 Elsevier Patient Education  [place].    Influenza (Flu) Vaccine (Inactivated or Recombinant): What You Need to Know 1. Why get vaccinated? Influenza vaccine can prevent influenza (flu). Flu is a contagious disease that spreads around the [place] every year,  usually between October and May. Anyone can get the flu, but it is more dangerous for some people. Infants and young children, people 61 years of age and older, pregnant women, and people with certain health conditions or a weakened immune system are at greatest risk of flu complications. Pneumonia, bronchitis, sinus infections and ear infections are examples of flu-related complications. If you have a medical condition, such as heart disease, cancer or diabetes, flu can make it worse. Flu can cause fever and chills, sore throat, muscle aches, fatigue, cough, headache, and runny or stuffy nose. Some people may have vomiting and diarrhea, though this is more common in children than adults. Each year thousands of people in the [place] die from flu, and many more are hospitalized. Flu vaccine prevents millions of illnesses and flu-related visits to the doctor each year. 2. Influenza vaccine CDC recommends everyone 74 months of age and older get vaccinated every flu season. Children 6 months through 30 years of age may need 2 doses during a single flu season. Everyone else needs only 1 dose each flu season. It takes about 2 weeks for protection to develop after vaccination. There are many flu viruses, and they are always changing. Each year a new flu vaccine is made to protect against three or four viruses that are likely to cause disease in the upcoming flu season. Even when the vaccine doesn't exactly match these viruses, it may still provide some protection. Influenza vaccine does not cause flu. Influenza vaccine may be given at the same time as other vaccines. 3. Talk with your health care provider Tell your vaccine provider if the person getting the vaccine:  Has had an allergic reaction after a previous dose of influenza vaccine, or has any severe, life-threatening allergies.  Has ever had Guillain-Barr Syndrome (also called GBS). In some cases, your health care provider may decide to postpone  influenza vaccination to a future visit. People with minor illnesses, such as a cold, may be vaccinated. People who are moderately or severely ill should usually wait until they recover before getting influenza vaccine.  Your health care provider can give you more information. 4. Risks of a vaccine reaction  Soreness, redness, and swelling where shot is given, fever, muscle aches, and headache can happen after influenza vaccine.  There may be a very small increased risk of Guillain-Barr Syndrome (GBS) after inactivated influenza vaccine (the flu shot). Young children who get the flu shot along with pneumococcal vaccine (PCV13), and/or DTaP vaccine at the same time might be slightly more likely to have a seizure caused by fever. Tell your health care provider if a child who is getting flu vaccine has ever had a seizure. People sometimes faint after medical procedures, including vaccination. Tell your provider if you feel dizzy or have vision changes or ringing in the ears. As with any medicine, there is a very remote chance of a vaccine causing a severe allergic reaction, other serious injury, or death. 5. What if there is a serious problem? An allergic reaction could occur after the vaccinated person leaves the clinic. If you see signs of a severe allergic reaction (hives, swelling of the face and throat, difficulty breathing, a fast heartbeat, dizziness, or weakness), call 9-1-1 and get the person to the nearest hospital. For other signs that concern you, call your health care provider. Adverse reactions should be reported to the Vaccine Adverse Event Reporting System (VAERS). Your health care provider will usually file this report, or you can do it yourself. Visit the VAERS website at www.vaers.SamedayNews.es or call 2674671322.VAERS is only for reporting reactions, and VAERS staff do not give medical advice. 6. The National Vaccine Injury Compensation Program The [employer] Vaccine Injury Compensation  Program (VICP) is a federal program that was created to compensate people who may have been injured by certain vaccines. Visit the VICP website at GoldCloset.com.ee or call (773)561-2304 to learn about the program and about filing a claim. There is a time limit to file a claim for compensation. 7. How can I learn more?  Ask your healthcare provider.  Call your local or state health department.  Contact the Centers for Disease Control and Prevention (CDC): ? Call 615-690-0235 (1-800-CDC-INFO) or ? Visit CDC's https://gibson.com/ Vaccine Information Statement (Interim) Inactivated Influenza Vaccine (05/10/2018) This information is not intended to replace advice given to you by your health care provider. Make sure you discuss any questions you have with your health care provider. Document Revised: 01/01/2019 Document Reviewed: 05/14/2018 Elsevier Patient Education  [place].

## 2020-06-22 NOTE — Assessment & Plan Note (Signed)
Aortic atherosclerosis identified on recent imaging study. Discussed with patient this diagnosis and the need for continued use of blood pressure control and lipid management to help reduce risks of cardiovascular events. She is already on a daily statin. Recommend continue current medications and monitor for signs that could indicate worsening cardiovascular disease. Follow-up with any future concerns or questions.

## 2020-06-22 NOTE — Progress Notes (Signed)
Established Patient Office Visit  Subjective:  Patient ID: Anna [name], female    DOB: [birth year]  Age: 84 y.o. MRN: [id number]  CC:  Chief Complaint  Patient presents with  . Leg Swelling    follow up regarding bilateral LEE on 05/25/2020    HPI Anna [name] is a pleasant 84 year old female presenting today for follow-up for lower extremity edema and recent Echocardiogram.   ECHO completed on 06/17/2020.  She tells me her lower extremity edema has improved significantly since she started wearing the compression stockings.  She reports that she is watching the amount of sodium in her diet and elevating her feet when she is at rest.  She does report that she started to experience pain from approximately her right hip down to her knee on the lateral side of her thigh after putting the compression stocking on her right foot.  She reports that she thought that it was possibly due to the stocking however after not wearing the stocking for several days the pain persists.  She describes this pain as a burning sensation.  She has had this in the very distant past but nothing recently.  She has been using icy hot patches and Vicks VapoRub which are helping minimally.  She denies any weakness to this area.  She does endorse some low back pain that does not seem to be worse any particular time of day.  She was recently transition from meloxicam to Celebrex however she is still taking the meloxicam to finish out the prescription that she has.  She reports that she does not feel this helps the pain any at all.  She also reports she has ongoing low back pain that has been present for quite some time.  She states that she did have some physical therapy in the past which was helpful but the pain has returned.  She is unsure if this was related to the hip.  She has not taken anything for this and she has not tried any kind of exercises.  She reports the pain is basically always there and nothing seems to make  it worse and nothing seems to make it better.  Past Medical History:  Diagnosis Date  . Dermatitis    seborrehic scalp  . Hiatal hernia   . History of basal cell carcinoma 07/20/2017   On nose.   Marland Kitchen History of melanoma 07/20/2017   Right upper arm   . Hyperlipidemia   . Macular degeneration     Past Surgical History:  Procedure Laterality Date  . BREAST CYST EXCISION Right 09/1962  . CATARACT EXTRACTION  03/2015  . melonoma Right 06/2016   arm  . MOHS SURGERY Left 06/2016   nose    Family History  Problem Relation Age of Onset  . Hyperlipidemia Sister   . Macular degeneration Sister   . Cancer - Prostate Brother   . Atrial fibrillation Maternal Aunt   . Cancer Mother        pancreatic  . Cancer Father        liver    Social History   Socioeconomic History  . Marital status: Single    Spouse name: Not on file  . Number of children: Not on file  . Years of education: 12+  . Highest education level: Master's degree (e.g., MA, MS, MEng, MEd, MSW, MBA)  Occupational History  . 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  Tobacco Use  . Smoking status: Never Smoker  . Smokeless tobacco: Never Used  Vaping Use  . Vaping Use: Never used  Substance and Sexual Activity  . Alcohol use: Yes    Alcohol/week: 1.0 standard drink    Types: 1 Glasses of wine per week    Comment: occasionally  . Drug use: No  . Sexual activity: Never  Other Topics Concern  . Not on file  Social History Narrative   She has a Scientist, water quality in Film/video editor.  He lives alone in a retirement community.  Her sister is Pharmacist, community.    Social Determinants of Health   Financial Resource Strain:   . Difficulty of Paying Living Expenses: Not on file  Food Insecurity:   . Worried About Charity fundraiser in the Last Year: Not on file  . Ran Out of Food in the Last Year: Not on file  Transportation Needs:   . Lack of Transportation  (Medical): Not on file  . Lack of Transportation (Non-Medical): Not on file  Physical Activity: Inactive  . Days of Exercise per Week: 0 days  . Minutes of Exercise per Session: 0 min  Stress:   . Feeling of Stress : Not on file  Social Connections: Unknown  . Frequency of Communication with Friends and Family: Not on file  . Frequency of Social Gatherings with Friends and Family: Once a week  . Attends Religious Services: Not on file  . Active Member of Clubs or Organizations: Not on file  . Attends Archivist Meetings: Not on file  . Marital Status: Not on file  Intimate Partner Violence:   . Fear of Current or Ex-Partner: Not on file  . Emotionally Abused: Not on file  . Physically Abused: Not on file  . Sexually Abused: Not on file    Outpatient Medications Prior to Visit  Medication Sig Dispense Refill  . albuterol (VENTOLIN HFA) 108 (90 Base) MCG/ACT inhaler Inhale 2 puffs into the lungs 2 (two) times daily.     Marland Kitchen ALPRAZolam (XANAX) 0.25 MG tablet Take 1 tablet (0.25 mg total) by mouth at bedtime as needed for sleep. 30 tablet 0  . AMBULATORY NON FORMULARY MEDICATION Take 5 mLs by mouth 2 (two) times daily. Swish 5 ML by mouth for 30 seconds and spit twice daily for two weeks  3  . AMBULATORY NON FORMULARY MEDICATION 20-30 mmHg Knee High Compression Stockings. 2 each 2  . ammonium lactate (AMLACTIN) 12 % cream Apply topically as needed for dry skin.    . Bevacizumab 2.75 MG/0.11ML SOSY Inject into the eye.    . calcium citrate-vitamin D (CITRACAL+D) 315-200 MG-UNIT tablet Take 1 tablet by mouth 2 (two) times daily.    . celecoxib (CELEBREX) 200 MG capsule One tablet once a day. 30 capsule 0  . COD LIVER OIL PO Take 1 mL by mouth daily.    . Flaxseed Oil OIL 1,000 mg.    . Fluticasone-Salmeterol (ADVAIR) 250-50 MCG/DOSE AEPB Inhale 1 puff into the lungs 2 (two) times daily. 60 each 5  . ketoconazole (NIZORAL) 2 % shampoo USE THREE TIMES A WEEK AS DIRECTED    .  latanoprost (XALATAN) 0.005 % ophthalmic solution Place 1 drop into both eyes daily.    Marland Kitchen lovastatin (MEVACOR) 40 MG tablet TAKE ONE TABLET BY MOUTH NIGHTLY 90 tablet 3  . meclizine (ANTIVERT) 25 MG tablet Take 1 tablet (25 mg total) by mouth 3 (three) times daily as needed  for dizziness. 30 tablet 0  . Multiple Vitamins-Minerals (PRESERVISION AREDS 2) CAPS Take by mouth.    Marland Kitchen MYRBETRIQ 25 MG TB24 tablet Take 1 tablet (25 mg total) by mouth daily. (Patient taking differently: Take 25 mg by mouth as needed. ) 30 tablet 11  . omeprazole (PRILOSEC) 40 MG capsule Take 1 capsule (40 mg total) by mouth daily. 90 capsule 3  . sucralfate (CARAFATE) 1 g tablet TAKE 1 TABLET BY MOUTH  TWICE DAILY 180 tablet 3  . timolol (BETIMOL) 0.5 % ophthalmic solution Place 1 drop into the left eye 2 (two) times daily.    . timolol (TIMOPTIC) 0.5 % ophthalmic solution     . TURMERIC PO Take 1,000 mg by mouth.    Marland Kitchen UNABLE TO FIND Right eye injection every 5-6 weeks for Macular Degeneration    . vitamin B-12 (CYANOCOBALAMIN) 1000 MCG tablet Take 1,000 mcg by mouth daily.     . furosemide (LASIX) 20 MG tablet Take 1-2 tablets (20-40 mg total) by mouth daily. (Patient not taking: Reported on 06/22/2020) 40 tablet 0   No facility-administered medications prior to visit.    Allergies  Allergen Reactions  . Aleve [Naproxen Sodium]     Causes elevation in cholesterol   . Azithromycin Rash    ROS Review of Systems    Objective:    Physical Exam Vitals and nursing note reviewed.  Constitutional:      Appearance: Normal appearance. She is obese.  HENT:     Head: Normocephalic.  Eyes:     Extraocular Movements: Extraocular movements intact.     Conjunctiva/sclera: Conjunctivae normal.     Pupils: Pupils are equal, round, and reactive to light.  Cardiovascular:     Rate and Rhythm: Normal rate and regular rhythm.     Pulses: Normal pulses.     Heart sounds: Normal heart sounds.  Pulmonary:     Effort:  Pulmonary effort is normal.     Breath sounds: Normal breath sounds.  Abdominal:     General: Abdomen is flat. Bowel sounds are normal.     Palpations: Abdomen is soft.  Musculoskeletal:        General: Normal range of motion.     Cervical back: Normal range of motion.     Right lower leg: No edema.     Left lower leg: No edema.  Skin:    General: Skin is warm and dry.     Capillary Refill: Capillary refill takes less than 2 seconds.  Neurological:     General: No focal deficit present.     Mental Status: She is alert and oriented to person, place, and time.     Motor: No weakness.     Coordination: Coordination normal.     Gait: Gait normal.     Deep Tendon Reflexes: Reflexes normal.  Psychiatric:        Mood and Affect: Mood normal.        Behavior: Behavior normal.        Thought Content: Thought content normal.        Judgment: Judgment normal.     BP (!) 135/92   Pulse 87   Temp 98 F (36.7 C) (Oral)   Ht 5\' 3"  (1.6 m)   Wt 186 lb 8 oz (84.6 kg)   SpO2 95%   BMI 33.04 kg/m  Wt Readings from Last 3 Encounters:  06/22/20 186 lb 8 oz (84.6 kg)  06/03/20 187 lb (84.8 kg)  05/25/20 190 lb 11.2 oz (86.5 kg)     There are no preventive care reminders to display for this patient.  There are no preventive care reminders to display for this patient.  Lab Results  Component Value Date   TSH 0.69 05/12/2020   Lab Results  Component Value Date   WBC 6.4 05/12/2020   HGB 15.0 05/12/2020   HCT 45.5 (H) 05/12/2020   MCV 91.2 05/12/2020   PLT 238 05/12/2020   Lab Results  Component Value Date   NA 140 05/25/2020   K 3.8 05/25/2020   CO2 30 05/25/2020   GLUCOSE 103 (H) 05/25/2020   BUN 9 05/25/2020   CREATININE 0.77 05/25/2020   BILITOT 0.9 05/12/2020   ALKPHOS 63 11/14/2016   AST 28 05/12/2020   ALT 11 05/12/2020   PROT 6.0 (L) 05/12/2020   CALCIUM 9.0 05/25/2020   Lab Results  Component Value Date   CHOL 170 08/09/2019   Lab Results  Component  Value Date   HDL 57 08/09/2019   Lab Results  Component Value Date   LDLCALC 91 08/09/2019   Lab Results  Component Value Date   TRIG 122 08/09/2019   Lab Results  Component Value Date   CHOLHDL 3.0 08/09/2019   Lab Results  Component Value Date   HGBA1C 5.5 03/16/2020      Assessment & Plan:   Problem List Items Addressed This Visit      Cardiovascular and Mediastinum   Aortic atherosclerosis ([place]) - Primary    Aortic atherosclerosis identified on recent imaging study. Discussed with patient this diagnosis and the need for continued use of blood pressure control and lipid management to help reduce risks of cardiovascular events. She is already on a daily statin. Recommend continue current medications and monitor for signs that could indicate worsening cardiovascular disease. Follow-up with any future concerns or questions.       Relevant Medications   furosemide (LASIX) 20 MG tablet   Venous insufficiency of both lower extremities    Bilateral lower extremity edema worsening throughout the day and improving in the morning. Patient has had significant improvement with utilization of compression stockings. We did discuss continuing the compression stockings and monitoring for a low-sodium diet and elevating her lower extremities when she is seated. There was some concern that the compression stocking had caused some of the lateral thigh pain she was experiencing however I feel that this is a completely different episode and use of the compression stockings will not interfere with this. Her echocardiogram was fairly unremarkable which is encouraging. Did discuss the option of as needed furosemide to use when lower extremity swelling is significantly worsened. Follow-up if symptoms worsen or fail to improve.      Relevant Medications   furosemide (LASIX) 20 MG tablet     Nervous and Auditory   Meralgia paresthetica of right side    Symptoms and presentation consistent  with meralgia paresthetica of the right side. This is most likely related to increased flexion and use of the right hip related to putting the compression stocking on, if there is a relation at all. Discussed with the patient the option of utilizing heat and ice to the hip and buttock area, continued meloxicam or Celebrex, and will also add gabapentin for bedtime with the option to use low-dose during the day for nerve type pain. Instructions for exercises also given which may help eliminate low back pain and nerve compression. If symptoms persist beyond 2 weeks  will obtain imaging and may recommend formal physical therapy.      Relevant Medications   gabapentin (NEURONTIN) 100 MG capsule     Other   Chronic bilateral low back pain without sciatica    Bilateral low back pain that has been ongoing for quite some time now. Instructions given for low back exercises.  Discussed with the patient utilization of lidocaine patches and muscle rubs which may help with some of her symptoms. Continue to use of meloxicam/Celebrex for pain. If symptoms persist beyond 2 weeks of conservative therapy at home with ice, heat, anti-inflammatories, and exercises will refer for formal formal physical therapy as this has been effective for her in the past.       Other Visit Diagnoses    Need for influenza vaccination       Relevant Orders   Flu Vaccine QUAD High Dose(Fluad) (Completed)      Meds ordered this encounter  Medications  . furosemide (LASIX) 20 MG tablet    Sig: Take 1 tablet (20 mg total) by mouth as needed for edema. You may take 1 tablet up to once every day for leg swelling not improved with stockings.    Dispense:  30 tablet    Refill:  3  . gabapentin (NEURONTIN) 100 MG capsule    Sig: Take 1-3 tablets (100-300 mg) as needed at bedtime for nerve pain. May take 1 tablet (100 mg) up to twice during the day for nerve pain.    Dispense:  120 capsule    Refill:  3    Follow-up: Return if  symptoms worsen or fail to improve, for edema/thigh pain .    Orma Render, NP

## 2020-06-22 NOTE — Assessment & Plan Note (Signed)
Bilateral low back pain that has been ongoing for quite some time now. Instructions given for low back exercises.  Discussed with the patient utilization of lidocaine patches and muscle rubs which may help with some of her symptoms. Continue to use of meloxicam/Celebrex for pain. If symptoms persist beyond 2 weeks of conservative therapy at home with ice, heat, anti-inflammatories, and exercises will refer for formal formal physical therapy as this has been effective for her in the past.

## 2020-06-30 ENCOUNTER — Ambulatory Visit: Payer: Medicare Other | Admitting: Family Medicine

## 2020-06-30 ENCOUNTER — Encounter: Payer: Self-pay | Admitting: Family Medicine

## 2020-06-30 ENCOUNTER — Ambulatory Visit (INDEPENDENT_AMBULATORY_CARE_PROVIDER_SITE_OTHER): Payer: Medicare Other | Admitting: Family Medicine

## 2020-06-30 VITALS — BP 128/71 | HR 93 | Temp 98.5°F | Wt 187.0 lb

## 2020-06-30 DIAGNOSIS — R600 Localized edema: Secondary | ICD-10-CM

## 2020-06-30 DIAGNOSIS — M8949 Other hypertrophic osteoarthropathy, multiple sites: Secondary | ICD-10-CM

## 2020-06-30 DIAGNOSIS — M159 Polyosteoarthritis, unspecified: Secondary | ICD-10-CM

## 2020-06-30 DIAGNOSIS — R630 Anorexia: Secondary | ICD-10-CM | POA: Diagnosis not present

## 2020-06-30 DIAGNOSIS — G5711 Meralgia paresthetica, right lower limb: Secondary | ICD-10-CM

## 2020-06-30 DIAGNOSIS — Z8379 Family history of other diseases of the digestive system: Secondary | ICD-10-CM | POA: Diagnosis not present

## 2020-06-30 DIAGNOSIS — K429 Umbilical hernia without obstruction or gangrene: Secondary | ICD-10-CM

## 2020-06-30 NOTE — Assessment & Plan Note (Signed)
I definitely think this is stress related I am not hearing any red flag symptoms on history or exam that concern me for other underlying causes she does have a family history of pancreatitic Ca in her mother.  Now that she has a plan to move to [place] which she is actually quite excited about we discussed just continuing to monitor her weight and appetite over the next 3 to 4 weeks.  If it is not improving she will reach out let me know we can do some additional labs and work-up at that point including checking for pancreatic cancer.

## 2020-06-30 NOTE — Progress Notes (Signed)
Established Patient Office Visit  Subjective:  Patient ID: Anna [name], female    DOB: 11-07-1935  Age: 84 y.o. MRN: [id number]  CC:  Chief Complaint  Patient presents with  . Anorexia    HPI Anna [name] presents for follow-up. I last saw her about 6 to 7 weeks ago and at that time she was having bilateral lower extremity swelling as well as some increase shortness of breath for about a day as well as fatigue. She also complained of some anxiousness and some diarrhea. She was started on compression stockings and Lasix and felt like both of those things had been helping. Also been decreasing her salt intake and elevating her feet when she can. Echocardiogram completed on September 22.  Dec appetite x 1 month.  Has been eating small portions to make herself eat. No changes in BM or abd pain.  No GERD, bloating or belching. She is hydrating well.  No nausea with eating.  She says it is really just a loss of appetite.  No loss of taste or smell.  She just got into [place].  She currently lives in a retirement community but says that it just has not been a good fit.  She really needs more of that group and social support and feels like [facility] which would be a better fit for her.  She went to visit and is quite excited about it.  She is trying to put in for a studio apartment and will hopefully be able to move in by January.  She does have some friends and family including her sister who she talks to frequently regularly but just does not get that social interaction and support that she would really like to have.  She reports that her lower leg swelling has been much better since wearing her compression stockings.  Initially when she first noted to wear them she started to experience some right leg pain.  And so he quit wearing them thinking that that might have been the trigger but then after not wearing them for several days realized that the pain was continuing so she has restarted  wearing her stockings and that has been helpful.  In regards to the right sided leg and upper thigh pain she was diagnosed with meralgia paresthetica and has been started on gabapentin.  She tried to take 3 tabs of the gabapentin at bedtime but said it was too sedating when she tried to get up in the morning so she is reduce that back down to 2 and that actually seems to be working well.  She says if she knows she has to drive somewhere then she just will take it off before bedtime.  She says it has reduced her pain that just has not stopped it.  Also has question about her arthritis medication. She wants a pill she can use for all of her joints.   She also wants to [facility] if ok to wait until she moves to [facility] [place] to see the surgeon about her umbilival hernia.   Past Medical History:  Diagnosis Date  . Dermatitis    seborrehic scalp  . Hiatal hernia   . History of basal cell carcinoma 07/20/2017   On nose.   Marland Kitchen History of melanoma 07/20/2017   Right upper arm   . Hyperlipidemia   . Macular degeneration     Past Surgical History:  Procedure Laterality Date  . BREAST CYST EXCISION Right 09/1962  . CATARACT EXTRACTION  03/2015  .  melonoma Right 06/2016   arm  . MOHS SURGERY Left 06/2016   nose    Family History  Problem Relation Age of Onset  . Hyperlipidemia Sister   . Macular degeneration Sister   . Cancer - Prostate Brother   . Atrial fibrillation Maternal Aunt   . Cancer Mother        pancreatic  . Cancer Father        liver    Social History   Socioeconomic History  . Marital status: Single    Spouse name: Not on file  . Number of children: Not on file  . Years of education: 12+  . Highest education level: Master's degree (e.g., MA, MS, MEng, MEd, MSW, MBA)  Occupational History  . 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  Tobacco Use  . Smoking status: Never Smoker  . Smokeless tobacco: Never Used   Vaping Use  . Vaping Use: Never used  Substance and Sexual Activity  . Alcohol use: Yes    Alcohol/week: 1.0 standard drink    Types: 1 Glasses of wine per week    Comment: occasionally  . Drug use: No  . Sexual activity: Never  Other Topics Concern  . Not on file  Social History Narrative   She has a Scientist, water quality in Film/video editor.  He lives alone in a retirement community.  Her sister is Pharmacist, community.    Social Determinants of Health   Financial Resource Strain:   . Difficulty of Paying Living Expenses: Not on file  Food Insecurity:   . Worried About Charity fundraiser in the Last Year: Not on file  . Ran Out of Food in the Last Year: Not on file  Transportation Needs:   . Lack of Transportation (Medical): Not on file  . Lack of Transportation (Non-Medical): Not on file  Physical Activity: Inactive  . Days of Exercise per Week: 0 days  . Minutes of Exercise per Session: 0 min  Stress:   . Feeling of Stress : Not on file  Social Connections: Unknown  . Frequency of Communication with Friends and Family: Not on file  . Frequency of Social Gatherings with Friends and Family: Once a week  . Attends Religious Services: Not on file  . Active Member of Clubs or Organizations: Not on file  . Attends Archivist Meetings: Not on file  . Marital Status: Not on file  Intimate Partner Violence:   . Fear of Current or Ex-Partner: Not on file  . Emotionally Abused: Not on file  . Physically Abused: Not on file  . Sexually Abused: Not on file    Outpatient Medications Prior to Visit  Medication Sig Dispense Refill  . albuterol (VENTOLIN HFA) 108 (90 Base) MCG/ACT inhaler Inhale 2 puffs into the lungs 2 (two) times daily.     Marland Kitchen ALPRAZolam (XANAX) 0.25 MG tablet Take 1 tablet (0.25 mg total) by mouth at bedtime as needed for sleep. 30 tablet 0  . AMBULATORY NON FORMULARY MEDICATION Take 5 mLs by mouth 2 (two) times daily. Swish 5 ML by mouth for 30 seconds and spit twice  daily for two weeks  3  . AMBULATORY NON FORMULARY MEDICATION 20-30 mmHg Knee High Compression Stockings. 2 each 2  . ammonium lactate (AMLACTIN) 12 % cream Apply topically as needed for dry skin.    . Bevacizumab 2.75 MG/0.11ML SOSY Inject into the eye.    Marland Kitchen  calcium citrate-vitamin D (CITRACAL+D) 315-200 MG-UNIT tablet Take 1 tablet by mouth 2 (two) times daily.    . celecoxib (CELEBREX) 200 MG capsule One tablet once a day. 30 capsule 0  . COD LIVER OIL PO Take 1 mL by mouth daily.    . Flaxseed Oil OIL 1,000 mg.    . Fluticasone-Salmeterol (ADVAIR) 250-50 MCG/DOSE AEPB Inhale 1 puff into the lungs 2 (two) times daily. 60 each 5  . furosemide (LASIX) 20 MG tablet Take 1 tablet (20 mg total) by mouth as needed for edema. You may take 1 tablet up to once every day for leg swelling not improved with stockings. 30 tablet 3  . gabapentin (NEURONTIN) 100 MG capsule Take 1-3 tablets (100-300 mg) as needed at bedtime for nerve pain. May take 1 tablet (100 mg) up to twice during the day for nerve pain. 120 capsule 3  . ketoconazole (NIZORAL) 2 % shampoo USE THREE TIMES A WEEK AS DIRECTED    . latanoprost (XALATAN) 0.005 % ophthalmic solution Place 1 drop into both eyes daily.    Marland Kitchen lovastatin (MEVACOR) 40 MG tablet TAKE ONE TABLET BY MOUTH NIGHTLY 90 tablet 3  . meclizine (ANTIVERT) 25 MG tablet Take 1 tablet (25 mg total) by mouth 3 (three) times daily as needed for dizziness. 30 tablet 0  . Multiple Vitamins-Minerals (PRESERVISION AREDS 2) CAPS Take by mouth.    Marland Kitchen MYRBETRIQ 25 MG TB24 tablet Take 1 tablet (25 mg total) by mouth daily. (Patient taking differently: Take 25 mg by mouth as needed. ) 30 tablet 11  . omeprazole (PRILOSEC) 40 MG capsule Take 1 capsule (40 mg total) by mouth daily. 90 capsule 3  . sucralfate (CARAFATE) 1 g tablet TAKE 1 TABLET BY MOUTH  TWICE DAILY 180 tablet 3  . timolol (BETIMOL) 0.5 % ophthalmic solution Place 1 drop into the left eye 2 (two) times daily.    . timolol  (TIMOPTIC) 0.5 % ophthalmic solution     . TURMERIC PO Take 1,000 mg by mouth.    Marland Kitchen UNABLE TO FIND Right eye injection every 5-6 weeks for Macular Degeneration    . vitamin B-12 (CYANOCOBALAMIN) 1000 MCG tablet Take 1,000 mcg by mouth daily.      No facility-administered medications prior to visit.    Allergies  Allergen Reactions  . Aleve [Naproxen Sodium]     Causes elevation in cholesterol   . Azithromycin Rash    ROS Review of Systems    Objective:    Physical Exam Constitutional:      Appearance: She is well-developed.  HENT:     Head: Normocephalic and atraumatic.  Cardiovascular:     Rate and Rhythm: Normal rate and regular rhythm.     Heart sounds: Normal heart sounds.  Pulmonary:     Effort: Pulmonary effort is normal.     Breath sounds: Normal breath sounds.  Abdominal:     General: Abdomen is flat. Bowel sounds are normal.     Palpations: Abdomen is soft.     Tenderness: There is no abdominal tenderness.     Hernia: A hernia is present.  Skin:    General: Skin is warm and dry.  Neurological:     Mental Status: She is alert and oriented to person, place, and time.  Psychiatric:        Behavior: Behavior normal.     BP 128/71 (BP Location: Left Arm, Patient Position: Sitting, Cuff Size: Large)   Pulse 93  Temp 98.5 F (36.9 C) (Oral)   Wt 187 lb 0.6 oz (84.8 kg)   BMI 33.13 kg/m  Wt Readings from Last 3 Encounters:  06/30/20 187 lb 0.6 oz (84.8 kg)  06/22/20 186 lb 8 oz (84.6 kg)  06/03/20 187 lb (84.8 kg)     There are no preventive care reminders to display for this patient.  There are no preventive care reminders to display for this patient.  Lab Results  Component Value Date   TSH 0.69 05/12/2020   Lab Results  Component Value Date   WBC 6.4 05/12/2020   HGB 15.0 05/12/2020   HCT 45.5 (H) 05/12/2020   MCV 91.2 05/12/2020   PLT 238 05/12/2020   Lab Results  Component Value Date   NA 140 05/25/2020   K 3.8 05/25/2020   CO2 30  05/25/2020   GLUCOSE 103 (H) 05/25/2020   BUN 9 05/25/2020   CREATININE 0.77 05/25/2020   BILITOT 0.9 05/12/2020   ALKPHOS 63 11/14/2016   AST 28 05/12/2020   ALT 11 05/12/2020   PROT 6.0 (L) 05/12/2020   CALCIUM 9.0 05/25/2020   Lab Results  Component Value Date   CHOL 170 08/09/2019   Lab Results  Component Value Date   HDL 57 08/09/2019   Lab Results  Component Value Date   LDLCALC 91 08/09/2019   Lab Results  Component Value Date   TRIG 122 08/09/2019   Lab Results  Component Value Date   CHOLHDL 3.0 08/09/2019   Lab Results  Component Value Date   HGBA1C 5.5 03/16/2020      Assessment & Plan:   Problem List Items Addressed This Visit      Nervous and Auditory   Meralgia paresthetica of right side    She has gotten some relief with the gabapentin she wonders how long this will last before it goes away I was unable to give her a specific timeframe but says sometimes weeks to months it usually improved significantly on its own and when that happens we would be able to taper off the gabapentin.  She is getting some relief with the medication just being careful with the sedative effects.  Also reminded her that it does not completely stop the pain signal it really just turns it down like a volume button.        Musculoskeletal and Integument   Primary osteoarthritis involving multiple joints    Discussed with her that her Celebrex is actually her medication for her arthritis she just was not sure what she was supposed to be taking to help all of her joint pain        Other   Umbilical hernia without obstruction and without gangrene    OK to hold off until Jan for surgical consultation. She will call me when ready.       Bilateral lower extremity edema    i improved with compression stockings.      Anorexia - Primary    I definitely think this is stress related I am not hearing any red flag symptoms on history or exam that concern me for other underlying  causes she does have a family history of pancreatitic Ca in her mother.  Now that she has a plan to move to [place] which she is actually quite excited about we discussed just continuing to monitor her weight and appetite over the next 3 to 4 weeks.  If it is not improving she will reach out let  me know we can do some additional labs and work-up at that point including checking for pancreatic cancer.       Other Visit Diagnoses    Family history of pancreatitis          No orders of the defined types were placed in this encounter.   Follow-up: Return in about 3 months (around 09/30/2020).   Time spent 42 min in encounter.    Beatrice Lecher, MD

## 2020-06-30 NOTE — Assessment & Plan Note (Signed)
Discussed with her that her Celebrex is actually her medication for her arthritis she just was not sure what she was supposed to be taking to help all of her joint pain

## 2020-06-30 NOTE — Patient Instructions (Signed)
I will send her for a sleep aid to just use and try sparingly. Please call me back when you are ready to do the surgery referral and we can go ahead and put that in. Your Celebrex is your arthritis pill is for the arthritis in all of your joints. Keep track of your weight over the next 3 to 4 weeks.  If you feel that your appetite is getting a little bit better then that is wonderful.  If not, then please contact me and we will do some additional blood work.

## 2020-06-30 NOTE — Assessment & Plan Note (Signed)
OK to hold off until Jan for surgical consultation. She will call me when ready.

## 2020-06-30 NOTE — Assessment & Plan Note (Signed)
i improved with compression stockings.

## 2020-06-30 NOTE — Assessment & Plan Note (Signed)
She has gotten some relief with the gabapentin she wonders how long this will last before it goes away I was unable to give her a specific timeframe but says sometimes weeks to months it usually improved significantly on its own and when that happens we would be able to taper off the gabapentin.  She is getting some relief with the medication just being careful with the sedative effects.  Also reminded her that it does not completely stop the pain signal it really just turns it down like a volume button.

## 2020-07-06 ENCOUNTER — Other Ambulatory Visit: Payer: Self-pay | Admitting: Physician Assistant

## 2020-07-06 DIAGNOSIS — M19012 Primary osteoarthritis, left shoulder: Secondary | ICD-10-CM

## 2020-07-09 ENCOUNTER — Other Ambulatory Visit: Payer: Self-pay | Admitting: Family Medicine

## 2020-07-09 DIAGNOSIS — K219 Gastro-esophageal reflux disease without esophagitis: Secondary | ICD-10-CM

## 2020-07-13 ENCOUNTER — Other Ambulatory Visit: Payer: Self-pay | Admitting: Family Medicine

## 2020-07-13 DIAGNOSIS — Z1231 Encounter for screening mammogram for malignant neoplasm of breast: Secondary | ICD-10-CM

## 2020-07-17 ENCOUNTER — Ambulatory Visit (INDEPENDENT_AMBULATORY_CARE_PROVIDER_SITE_OTHER): Payer: Medicare Other | Admitting: Sports Medicine

## 2020-07-17 ENCOUNTER — Ambulatory Visit (INDEPENDENT_AMBULATORY_CARE_PROVIDER_SITE_OTHER): Payer: Medicare Other

## 2020-07-17 DIAGNOSIS — M25512 Pain in left shoulder: Secondary | ICD-10-CM | POA: Diagnosis not present

## 2020-07-17 DIAGNOSIS — M19012 Primary osteoarthritis, left shoulder: Secondary | ICD-10-CM

## 2020-07-17 NOTE — Progress Notes (Signed)
    Procedures performed today:    Procedure: Real-time Ultrasound Guided injection of the left subacromial bursa Device: Samsung [id number]  Verbal informed consent obtained.  Time-out conducted.  Noted no overlying erythema, induration, or other signs of local infection.  Skin prepped in a sterile fashion.  Local anesthesia: Topical Ethyl chloride.  With sterile technique and under real time ultrasound guidance:  Noted torn and retracted supraspinatus, 1 cc Kenalog 40, 2 cc lidocaine, 2 cc bupivacaine injected easily Completed without difficulty  Pain immediately resolved suggesting accurate placement of the medication.  Advised to call if fevers/chills, erythema, induration, drainage, or persistent bleeding.  Images permanently stored and available for review in PACS.  Impression: Technically successful ultrasound guided injection.  Independent interpretation of notes and tests performed by another provider:   None.  Brief History, Exam, Impression, and Recommendations:    Acute pain of left shoulder Anna [name] returns, she is a pleasant 84 year old female, I saw her about a year ago, she had some impingement symptoms, we did a subacromial injection and she had good relief until now. Unfortunately she is having recurrence of pain, she has done some therapy, impingement signs are present on exam, subacromial injection performed today, return to see me in 1 month as needed. Of note as she did have a fully torn and retracted supraspinatus we are going to place her in physical therapy, I explained that sometimes we can retrain the muscles around the shoulder to take the function of the cuff making surgery not entirely necessary. Return to see me in 6 weeks.    ___________________________________________ Gwen Her. Dianah Field, M.D., ABFM., CAQSM. Primary Care and [place] Instructor of [place] [facility]  [facility]

## 2020-07-17 NOTE — Assessment & Plan Note (Addendum)
Anna [name] returns, she is a pleasant 84 year old female, I saw her about a year ago, she had some impingement symptoms, we did a subacromial injection and she had good relief until now. Unfortunately she is having recurrence of pain, she has done some therapy, impingement signs are present on exam, subacromial injection performed today, return to see me in 1 month as needed. Of note as she did have a fully torn and retracted supraspinatus we are going to place her in physical therapy, I explained that sometimes we can retrain the muscles around the shoulder to take the function of the cuff making surgery not entirely necessary. Return to see me in 6 weeks.

## 2020-07-21 ENCOUNTER — Other Ambulatory Visit: Payer: Self-pay

## 2020-07-21 ENCOUNTER — Other Ambulatory Visit: Payer: Self-pay | Admitting: Family Medicine

## 2020-07-21 ENCOUNTER — Ambulatory Visit: Payer: Medicare Other | Admitting: Rehabilitative and Restorative Service Providers"

## 2020-07-28 ENCOUNTER — Other Ambulatory Visit: Payer: Self-pay

## 2020-07-28 ENCOUNTER — Other Ambulatory Visit: Payer: Self-pay | Admitting: Family Medicine

## 2020-07-28 ENCOUNTER — Ambulatory Visit (INDEPENDENT_AMBULATORY_CARE_PROVIDER_SITE_OTHER): Payer: Medicare Other | Admitting: Rehabilitative and Restorative Service Providers"

## 2020-07-28 DIAGNOSIS — R293 Abnormal posture: Secondary | ICD-10-CM | POA: Diagnosis not present

## 2020-07-28 DIAGNOSIS — M25512 Pain in left shoulder: Secondary | ICD-10-CM

## 2020-07-28 DIAGNOSIS — M6281 Muscle weakness (generalized): Secondary | ICD-10-CM | POA: Diagnosis not present

## 2020-07-28 NOTE — Therapy (Signed)
[place], [place], [address] Phone: [phone]   Fax:  [phone]  Physical Therapy Evaluation  Patient Details  Name: Anna [name] MRN: [id number] Date of Birth: 07-Sep-1936 Referring Provider (PT): Aundria Mems, MD   Encounter Date: 07/28/2020   PT End of Session - 07/28/20 2039    Visit Number 1    Number of Visits 12    Date for PT Re-Evaluation 09/08/20    Authorization Type UHC medicare    PT Start Time 1018    PT Stop Time 1100    PT Time Calculation (min) 42 min    Activity Tolerance Patient tolerated treatment well    Behavior During Therapy [facility] for tasks assessed/performed           Past Medical History:  Diagnosis Date   Dermatitis    seborrehic scalp   Hiatal hernia    History of basal cell carcinoma 07/20/2017   On nose.    History of melanoma 07/20/2017   Right upper arm    Hyperlipidemia    Macular degeneration     Past Surgical History:  Procedure Laterality Date   BREAST CYST EXCISION Right 09/1962   CATARACT EXTRACTION  03/2015   melonoma Right 06/2016   arm   MOHS SURGERY Left 06/2016   nose    There were no vitals filed for this visit.    Subjective Assessment - 07/28/20 1023    Subjective The patient had onset of L shoulder pain approximately 2 months ago that worsened since onset.  She has improved since an injection with Dr. Dianah Field on 07/17/20.  Pain is not present at rest, wors at night.    Diagnostic tests IMPRESSION:1. No acute displaced fracture or dislocation.2. Mild to moderate glenohumeral osteoarthritis.3. Osteopenia.    Patient Stated Goals More mobility in the arm, be able to reduce pain.    Currently in Pain? No/denies   pain goes up at night; cannot get comfortable.             [facility] PT Assessment - 07/28/20 1025      Assessment   Medical Diagnosis Acute pain left shoulder    Referring Provider (PT) Aundria Mems, MD    Onset Date/Surgical Date 07/17/20    Hand Dominance Right    Prior Therapy known from prior PT for L shoulder and back pain      Precautions   Precautions None      Restrictions   Weight Bearing Restrictions No      Balance Screen   Has the patient fallen in the past 6 months No    Has the patient had a decrease in activity level because of a fear of falling?  No    Is the patient reluctant to leave their home because of a fear of falling?  No      Home Environment   Living Environment Private residence    Living Arrangements Alone    Type of [place] One level    Home Equipment Grab bars - toilet      Prior Function   Level of Independence Independent      Observation/Other Assessments   Focus on Therapeutic Outcomes (FOTO)  41% limited      Sensation   Light Touch Appears Intact      Posture/Postural Control   Posture/Postural Control Postural limitations    Postural  Limitations Rounded Shoulders;Forward head      ROM / Strength   AROM / PROM / Strength AROM;Strength      AROM   Overall AROM  Deficits    AROM Assessment Site Shoulder    Right/Left Shoulder Left;Right    Right Shoulder Flexion 170 Degrees    Right Shoulder ABduction 170 Degrees    Right Shoulder Internal Rotation --   WFLs   Right Shoulder External Rotation --   WFLs   Left Shoulder Flexion 150 Degrees    Left Shoulder ABduction 160 Degrees    Left Shoulder Internal Rotation 60 Degrees    Left Shoulder External Rotation 70 Degrees      Strength   Overall Strength Deficits    Overall Strength Comments elbow 5/5 for flexion/extension    Strength Assessment Site Shoulder    Right/Left Shoulder Right;Left    Right Shoulder Flexion 4+/5    Right Shoulder ABduction 4+/5    Left Shoulder Flexion 4/5    Left Shoulder ABduction 4/5      Palpation   Palpation comment pain with palpation of latissimus, teres minor/major,  parascapular pain with palpation      Special Tests    Special Tests Rotator Cuff Impingement    Rotator Cuff Impingment tests Empty Can test;Full Can test      Empty Can test   Findings Positive    Side Left    Comment mild pain after gentle resistance      Full Can test   Findings Negative    Side Left                      Objective measurements completed on examination: See above findings.       [facility] Adult PT Treatment/Exercise - 07/28/20 1042      Self-Care   Self-Care Other Self-Care Comments    Other Self-Care Comments  pain wakes patient at night-- she reports she sleeps with the L arm flexed overhead-- PT discussed trying to modify sleeping position to avoid increasing pain      Exercises   Exercises Shoulder      Shoulder Exercises: Standing   External Rotation Strengthening;Both;10 reps    External Rotation Limitations L and W x 10 reps    Flexion AAROM;Left;5 reps    Flexion Limitations sliding arm at wall with end range stretch    Retraction Both;10 reps    Other Standing Exercises bent arm abduction-- patient did in front of mirror for cues to reduce shoulder shrug      Shoulder Exercises: Stretch   Corner Stretch Limitations door frame stretch    Other Shoulder Stretches upper trap stretch                  PT Education - 07/28/20 1054    Education Details HEP    Person(s) Educated Patient    Methods Explanation;Demonstration;Handout    Comprehension Verbalized understanding;Returned demonstration               PT Long Term Goals - 07/28/20 2108      PT LONG TERM GOAL #1   Title The patient will be indep with HEP.    Time 6    Period Weeks    Target Date 09/08/20      PT LONG TERM GOAL #2   Title The patient will reduce limitation per FOTO from 41% to < or equal to 36%.    Time 6  Period Weeks    Target Date 09/08/20      PT LONG TERM GOAL #3   Title The patient will imrpove L shoulder flexion to 160 degrees.     Time 6    Period Weeks    Target Date 09/08/20      PT LONG TERM GOAL #4   Title The patient will improve L shoulder strength to 4+/5 for flexion and abduction.    Time 6    Period Weeks    Target Date 09/08/20      PT LONG TERM GOAL #5   Title The patient will report sleeping through the night without waking due to pain.    Time 6    Period Weeks    Target Date 09/08/20                  Plan - 07/28/20 2110    Clinical Impression Statement The patient is an 84 yo female presenting to outpatient physical therapy with L shoulder pain.  She presents with impairments in soft tissue length in parascapular musculature, dec'd end range AROM L shoulder, dec'd strength, and postural abnormalities. PT to address deficits to promote return to prior functional status.    Examination-Activity Limitations Reach Overhead;Lift;Sleep    Examination-Participation Restrictions Meal Prep;Cleaning    Stability/Clinical Decision Making Stable/Uncomplicated    Clinical Decision Making Low    Rehab Potential Good    PT Frequency 2x / week    PT Duration 6 weeks    PT Treatment/Interventions ADLs/Self Care Home Management;Therapeutic exercise;Therapeutic activities;Patient/family education;Cryotherapy;Moist Heat;Electrical Stimulation;Taping;Manual techniques;Dry needling;Passive range of motion    PT Next Visit Plan isometric shoulder abduction working on scapular depression> progress to isotonic as able, progress strengthening to tolerance, stretching and parascapular STM    PT Home Exercise Plan Access Code: [email or web address]    Consulted and Agree with Plan of Care Patient           Patient will benefit from skilled therapeutic intervention in order to improve the following deficits and impairments:  Pain, Decreased range of motion, Increased fascial restricitons, Impaired flexibility, Decreased strength, Postural dysfunction  Visit Diagnosis: Acute pain of left shoulder  Muscle weakness  (generalized)  Abnormal posture     Problem List Patient Active Problem List   Diagnosis Date Noted   Anorexia 98/07/9146   Umbilical hernia without obstruction and without gangrene 06/30/2020   Meralgia paresthetica of right side 06/22/2020   Venous insufficiency of both lower extremities 06/22/2020   Primary osteoarthritis, left shoulder 06/03/2020   Bilateral lower extremity edema 05/25/2020   Aortic atherosclerosis ([place]) 05/13/2020   Bunion, right foot 82/95/6213   Lichen planus 08/65/7846   Asthma in adult 11/22/2019   Mixed restrictive and obstructive lung disease ([place]) 11/22/2019   Primary osteoarthritis involving multiple joints 11/22/2019   Acute pain of left shoulder 09/05/2019   Chronic bilateral low back pain without sciatica 09/05/2019   OAB (overactive bladder) 08/08/2019   Insomnia 08/08/2019   History of melanoma 07/20/2017   History of basal cell carcinoma 07/20/2017   IFG (impaired fasting glucose) 07/17/2017   Macular degeneration    Chronic fatigue 08/06/2015   Gastroesophageal reflux disease without esophagitis 08/06/2015   Glaucoma 08/06/2015   Hiatal hernia 08/06/2015   Melanoma of right upper arm ([place]) 08/06/2015   Mixed hyperlipidemia 08/06/2015   Rupture of right biceps tendon 08/06/2015   Biceps tendonitis of both shoulders 08/06/2015    Walda Hertzog , PT 07/28/2020, 9:35 PM  [facility] [place], [place], [address] Phone: [phone]   Fax:  [phone]  Name: Anna [name] MRN: [id number] Date of Birth: 07-Jun-1936

## 2020-07-28 NOTE — Patient Instructions (Signed)
Access Code: [email or web address] URL: [email or web address] Date: 07/28/2020 Prepared by: Rudell Cobb  Exercises Seated Upper Trapezius Stretch - 2 x daily - 7 x weekly - 1 sets - 3 reps - 30 seconds hold Doorway Pec Stretch at 60 Elevation - 2 x daily - 7 x weekly - 1 sets - 3 reps - 30 seconds hold Standing Shoulder W at Wall - 2 x daily - 7 x weekly - 1 sets - 10 reps Standing Single Arm Shoulder Flexion Stretch on Wall - 2 x daily - 7 x weekly - 1 sets - 5 reps - 10 seconds hold

## 2020-07-30 ENCOUNTER — Encounter: Payer: Medicare Other | Admitting: Physical Therapy

## 2020-08-03 ENCOUNTER — Ambulatory Visit (INDEPENDENT_AMBULATORY_CARE_PROVIDER_SITE_OTHER): Payer: Medicare Other | Admitting: Physical Therapy

## 2020-08-03 ENCOUNTER — Other Ambulatory Visit: Payer: Self-pay

## 2020-08-03 ENCOUNTER — Ambulatory Visit: Payer: Medicare Other

## 2020-08-03 DIAGNOSIS — R293 Abnormal posture: Secondary | ICD-10-CM

## 2020-08-03 DIAGNOSIS — M6281 Muscle weakness (generalized): Secondary | ICD-10-CM

## 2020-08-03 DIAGNOSIS — M25512 Pain in left shoulder: Secondary | ICD-10-CM

## 2020-08-03 NOTE — Therapy (Signed)
[place], [place], [address] Phone: [phone]   Fax:  [phone]  Physical Therapy Treatment  Patient Details  Name: Anna [name] MRN: [id number] Date of Birth: [birth year] Referring Provider (PT): Aundria Mems, MD   Encounter Date: 08/03/2020   PT End of Session - 08/03/20 1053    Visit Number 2    Number of Visits 12    Date for PT Re-Evaluation 09/08/20    Authorization Type UHC medicare    PT Start Time 1018    PT Stop Time 1052    PT Time Calculation (min) 34 min    Activity Tolerance Patient tolerated treatment well    Behavior During Therapy [facility] for tasks assessed/performed           Past Medical History:  Diagnosis Date  . Dermatitis    seborrehic scalp  . Hiatal hernia   . History of basal cell carcinoma 07/20/2017   On nose.   [place] History of melanoma 07/20/2017   Right upper arm   . Hyperlipidemia   . Macular degeneration     Past Surgical History:  Procedure Laterality Date  . BREAST CYST EXCISION Right 09/1962  . CATARACT EXTRACTION  03/2015  . melonoma Right 06/2016   arm  . MOHS SURGERY Left 06/2016   nose    There were no vitals filed for this visit.   Subjective Assessment - 08/03/20 1021    Subjective Pt has changed the way she is sleeping; no longer sleeping with Lt arm overhead. Pain is much improved; no longer experiencing it.  Would like to focus strengthening.    Currently in Pain? No/denies              [facility] PT Assessment - 08/03/20 0001      Assessment   Medical Diagnosis Acute pain left shoulder    Referring Provider (PT) Aundria Mems, MD    Onset Date/Surgical Date 07/17/20    Hand Dominance Right    Prior Therapy known from prior PT for L shoulder and back pain            [facility] Adult PT Treatment/Exercise - 08/03/20 0001      Shoulder Exercises: Seated   External Rotation Both;10 reps   2 sets   Theraband Level  (Shoulder External Rotation) Level 1 (Yellow)    Flexion Strengthening;Left;10 reps;Weights   to shoulder height, 2 sets   Flexion Weight (lbs) 1    Abduction Strengthening;Left;10 reps;Weights   to shoulder height (2 sets of 5, then1 set of 5)   ABduction Weight (lbs) 1      Shoulder Exercises: Standing   Other Standing Exercises W's x 5 sec x 10 reps       Shoulder Exercises: Stretch   Wall Stretch - Flexion 5 reps;10 seconds    Other Shoulder Stretches low doorway stretch x 15 sec x 2 reps     Other Shoulder Stretches bicep stretch holding elevated table x 15 sec x 3 reps; switched to bilat shoulder ext AAROM holding cane x 10 reps       Neck Exercises: Stretches   Upper Trapezius Stretch Right;2 reps;20 seconds    cues for head position            PT Education - 08/03/20 1053    Education Details HEP - added strengthening, issued yellow band.    Person(s) Educated Patient    Methods Explanation;Handout;Demonstration;Verbal cues;Tactile cues  Comprehension Verbalized understanding;Returned demonstration               PT Long Term Goals - 07/28/20 2108      PT LONG TERM GOAL #1   Title The patient will be indep with HEP.    Time 6    Period Weeks    Target Date 09/08/20      PT LONG TERM GOAL #2   Title The patient will reduce limitation per FOTO from 41% to < or equal to 36%.    Time 6    Period Weeks    Target Date 09/08/20      PT LONG TERM GOAL #3   Title The patient will imrpove L shoulder flexion to 160 degrees.    Time 6    Period Weeks    Target Date 09/08/20      PT LONG TERM GOAL #4   Title The patient will improve L shoulder strength to 4+/5 for flexion and abduction.    Time 6    Period Weeks    Target Date 09/08/20      PT LONG TERM GOAL #5   Title The patient will report sleeping through the night without waking due to pain.    Time 6    Period Weeks    Target Date 09/08/20                 Plan - 08/03/20 1053    Clinical  Impression Statement Positive response in Lt shoulder pain with change in sleep position.   Added gentle Lt shoulder strengthening exercises without difficulty or pain.  Pt fatigues quickly with Lt shoulder abdct; performed sets of 5 reps. Progressing well towards goals.    Examination-Activity Limitations Reach Overhead;Lift;Sleep    Examination-Participation Restrictions Meal Prep;Cleaning    Stability/Clinical Decision Making Stable/Uncomplicated    Rehab Potential Good    PT Frequency 2x / week    PT Duration 6 weeks    PT Treatment/Interventions ADLs/Self Care Home Management;Therapeutic exercise;Therapeutic activities;Patient/family education;Cryotherapy;Moist Heat;Electrical Stimulation;Taping;Manual techniques;Dry needling;Passive range of motion    PT Next Visit Plan isometric shoulder abduction working on scapular depression> progress to isotonic as able, progress strengthening to tolerance, stretching and parascapular STM    PT Home Exercise Plan Access Code: [email or web address]    Consulted and Agree with Plan of Care Patient           Patient will benefit from skilled therapeutic intervention in order to improve the following deficits and impairments:  Pain, Decreased range of motion, Increased fascial restricitons, Impaired flexibility, Decreased strength, Postural dysfunction  Visit Diagnosis: Acute pain of left shoulder  Muscle weakness (generalized)  Abnormal posture     Problem List Patient Active Problem List   Diagnosis Date Noted  . Anorexia 06/30/2020  . Umbilical hernia without obstruction and without gangrene 06/30/2020  . Meralgia paresthetica of right side 06/22/2020  . Venous insufficiency of both lower extremities 06/22/2020  . Primary osteoarthritis, left shoulder 06/03/2020  . Bilateral lower extremity edema 05/25/2020  . Aortic atherosclerosis ([place]) 05/13/2020  . Bunion, right foot 11/22/2019  . Lichen planus 22/10/5425  . Asthma in adult 11/22/2019  .  Mixed restrictive and obstructive lung disease ([place]) 11/22/2019  . Primary osteoarthritis involving multiple joints 11/22/2019  . Acute pain of left shoulder 09/05/2019  . Chronic bilateral low back pain without sciatica 09/05/2019  . OAB (overactive bladder) 08/08/2019  . Insomnia 08/08/2019  . History of melanoma 07/20/2017  . History of  basal cell carcinoma 07/20/2017  . IFG (impaired fasting glucose) 07/17/2017  . Macular degeneration   . Chronic fatigue 08/06/2015  . Gastroesophageal reflux disease without esophagitis 08/06/2015  . Glaucoma 08/06/2015  . Hiatal hernia 08/06/2015  . Melanoma of right upper arm ([place]) 08/06/2015  . Mixed hyperlipidemia 08/06/2015  . Rupture of right biceps tendon 08/06/2015  . Biceps tendonitis of both shoulders 08/06/2015   Kerin Perna, PTA 08/03/20 10:56 AM  [facility] [place], [place], [address] Phone: [phone]   Fax:  [phone]  Name: Anna [name] MRN: [id number] Date of Birth: [birth year]

## 2020-08-03 NOTE — Patient Instructions (Signed)
Access Code: [email or web address]: [email or web address]: 11/08/2021Prepared by: [place]  Seated Upper Trapezius Stretch - 2 x daily - 7 x weekly - 1 sets - 3 reps - 30 seconds hold  Doorway Pec Stretch at 60 Elevation - 2 x daily - 7 x weekly - 1 sets - 3 reps - 30 seconds hold  Standing Shoulder W at Wall - 2 x daily - 7 x weekly - 1 sets - 10 reps  Standing Single Arm Shoulder Flexion Stretch on Wall - 2 x daily - 7 x weekly - 1 sets - 5 reps - 10 seconds hold  Seated Single Arm Shoulder Flexion with Dumbbells - 1 x daily - 7 x weekly - 1-2 sets - 10 reps  Seated Single Arm Shoulder Abduction with Dumbbell - Thumb Up - 1 x daily - 7 x weekly - 3 sets - 5 reps  Shoulder External Rotation and Scapular Retraction with Resistance - 1 x daily - 7 x weekly - 1-2 sets - 10 reps  Standing Shoulder Extension ROM with [place] - 1 x daily - 7 x weekly - 1 sets - 10 reps

## 2020-08-07 ENCOUNTER — Other Ambulatory Visit: Payer: Self-pay

## 2020-08-07 ENCOUNTER — Encounter: Payer: Self-pay | Admitting: Physical Therapy

## 2020-08-07 ENCOUNTER — Ambulatory Visit (INDEPENDENT_AMBULATORY_CARE_PROVIDER_SITE_OTHER): Payer: Medicare Other | Admitting: Physical Therapy

## 2020-08-07 DIAGNOSIS — M25512 Pain in left shoulder: Secondary | ICD-10-CM | POA: Diagnosis not present

## 2020-08-07 DIAGNOSIS — M6281 Muscle weakness (generalized): Secondary | ICD-10-CM | POA: Diagnosis not present

## 2020-08-07 DIAGNOSIS — R293 Abnormal posture: Secondary | ICD-10-CM

## 2020-08-07 NOTE — Patient Instructions (Addendum)
Cane Exercise: Extension / Internal Rotation    Stand holding cane behind back with both hands palm-up. Slide cane up spine toward head. Hold __5__ seconds. Repeat __5-10__ times. Do __1__ sessions per day.   Overhead Dumbbell Press    Sit on ball holding _1__ lb dumbbells. Press dumbbells above head. Keep palms facing forward. Do _5__ sets of __5_ repetitions. Do alternating arms.

## 2020-08-07 NOTE — Therapy (Signed)
[place], [place], [address] Phone: [phone]   Fax:  [phone]  Physical Therapy Treatment  Patient Details  Name: Anna [name] MRN: [id number] Date of Birth: Feb 25, 1936 Referring Provider (PT): Aundria Mems, MD   Encounter Date: 08/07/2020   PT End of Session - 08/07/20 1149    Visit Number 3    Number of Visits 12    Date for PT Re-Evaluation 09/08/20    Authorization Type UHC medicare    PT Start Time 1149    PT Stop Time 1221    PT Time Calculation (min) 32 min    Activity Tolerance Patient tolerated treatment well    Behavior During Therapy [facility] for tasks assessed/performed           Past Medical History:  Diagnosis Date  . Dermatitis    seborrehic scalp  . Hiatal hernia   . History of basal cell carcinoma 07/20/2017   On nose.   Marland Kitchen History of melanoma 07/20/2017   Right upper arm   . Hyperlipidemia   . Macular degeneration     Past Surgical History:  Procedure Laterality Date  . BREAST CYST EXCISION Right 09/1962  . CATARACT EXTRACTION  03/2015  . melonoma Right 06/2016   arm  . MOHS SURGERY Left 06/2016   nose    There were no vitals filed for this visit.   Subjective Assessment - 08/07/20 1150    Subjective "I'm doing excellent".  Pt reports she no longer has pain at night, since switching sleep position, and no pain with lifting boxes for packing. Still feels weaker in her LUE.   Patient Stated Goals More mobility in the arm, be able to reduce pain.    Currently in Pain? No/denies              [facility] PT Assessment - 08/07/20 0001      Assessment   Medical Diagnosis Acute pain left shoulder    Referring Provider (PT) Aundria Mems, MD    Onset Date/Surgical Date 07/17/20    Hand Dominance Right    Next MD Visit 08/28/20    Prior Therapy known from prior PT for L shoulder and back pain      AROM   Right/Left Shoulder --   taken in  standing   Right Shoulder Extension 55 Degrees    Right Shoulder Flexion 150 Degrees    Right Shoulder ABduction 134 Degrees    Right Shoulder External Rotation 70 Degrees    Left Shoulder Extension 50 Degrees    Left Shoulder Flexion 150 Degrees    Left Shoulder ABduction 140 Degrees    Left Shoulder External Rotation 60 Degrees            [facility] Adult PT Treatment/Exercise - 08/07/20 0001      Shoulder Exercises: Seated   External Rotation Both;10 reps    Theraband Level (Shoulder External Rotation) Level 1 (Yellow)    Flexion Strengthening;Left;10 reps;Weights   to shoulder height,    Flexion Weight (lbs) 1    Abduction Strengthening;Left;Right;10 reps   to shoulder height   ABduction Weight (lbs) 1    Other Seated Exercises bicep curl to overhead press with 1# x 5 reps, 2 sets each arm       Shoulder Exercises: Standing   Internal Rotation Both;10 reps;AAROM   dowel behind back   Extension AAROM;Both;10 reps   dowel behind back     Shoulder Exercises: ROM/Strengthening  UBE (Upper Arm Bike) L1: 1.5 min forward, 30 sec backward. - standing      Shoulder Exercises: Stretch   Wall Stretch - Flexion 5 reps;10 seconds    Other Shoulder Stretches low doorway stretch x 15 sec x 2 reps, cues for form              PT Long Term Goals - 08/07/20 1213      PT LONG TERM GOAL #1   Title The patient will be indep with HEP.    Time 6    Period Weeks    Status On-going      PT LONG TERM GOAL #2   Title The patient will reduce limitation per FOTO from 41% to < or equal to 36%.    Time 6    Period Weeks    Status On-going      PT LONG TERM GOAL #3   Title The patient will improve L shoulder flexion to 160 degrees.    Time 6    Period Weeks    Status On-going      PT LONG TERM GOAL #4   Title The patient will improve L shoulder strength to 4+/5 for flexion and abduction.    Time 6    Period Weeks    Status On-going      PT LONG TERM GOAL #5   Title The patient will  report sleeping through the night without waking due to pain.    Time 6    Period Weeks    Status Achieved                 Plan - 08/07/20 1214    Clinical Impression Statement Pt's Lt shoulder ROM similar to the RUE in standing; some limitations in ext and ER.  Pt has met LTG #5 with improved sleep.  Positive improvements in ROM in Lt shoulder.  She was able to tolerate increased reps of resisted Lt shoulder abdct before needing rest break.  Progressing well towards remaining goals.    Examination-Activity Limitations Reach Overhead;Lift;Sleep    Examination-Participation Restrictions Meal Prep;Cleaning    Stability/Clinical Decision Making Stable/Uncomplicated    Rehab Potential Good    PT Frequency 2x / week    PT Duration 6 weeks    PT Treatment/Interventions ADLs/Self Care Home Management;Therapeutic exercise;Therapeutic activities;Patient/family education;Cryotherapy;Moist Heat;Electrical Stimulation;Taping;Manual techniques;Dry needling;Passive range of motion    PT Next Visit Plan continue progressive Lt shoulder ROM/ strengthening.    PT Home Exercise Plan Access Code: [email or web address]    Consulted and Agree with Plan of Care Patient           Patient will benefit from skilled therapeutic intervention in order to improve the following deficits and impairments:  Pain, Decreased range of motion, Increased fascial restricitons, Impaired flexibility, Decreased strength, Postural dysfunction  Visit Diagnosis: Acute pain of left shoulder  Muscle weakness (generalized)  Abnormal posture     Problem List Patient Active Problem List   Diagnosis Date Noted  . Anorexia 06/30/2020  . Umbilical hernia without obstruction and without gangrene 06/30/2020  . Meralgia paresthetica of right side 06/22/2020  . Venous insufficiency of both lower extremities 06/22/2020  . Primary osteoarthritis, left shoulder 06/03/2020  . Bilateral lower extremity edema 05/25/2020  . Aortic  atherosclerosis ([place]) 05/13/2020  . Bunion, right foot 11/22/2019  . Lichen planus 03/55/9741  . Asthma in adult 11/22/2019  . Mixed restrictive and obstructive lung disease ([place]) 11/22/2019  . Primary osteoarthritis involving multiple  joints 11/22/2019  . Acute pain of left shoulder 09/05/2019  . Chronic bilateral low back pain without sciatica 09/05/2019  . OAB (overactive bladder) 08/08/2019  . Insomnia 08/08/2019  . History of melanoma 07/20/2017  . History of basal cell carcinoma 07/20/2017  . IFG (impaired fasting glucose) 07/17/2017  . Macular degeneration   . Chronic fatigue 08/06/2015  . Gastroesophageal reflux disease without esophagitis 08/06/2015  . Glaucoma 08/06/2015  . Hiatal hernia 08/06/2015  . Melanoma of right upper arm ([place]) 08/06/2015  . Mixed hyperlipidemia 08/06/2015  . Rupture of right biceps tendon 08/06/2015  . Biceps tendonitis of both shoulders 08/06/2015   Kerin Perna, PTA 08/07/20 4:46 PM  [place], [place], [address] Phone: [phone]   Fax:  [phone]  Name: Anna [name] MRN: [id number] Date of Birth: [birth year]

## 2020-08-10 ENCOUNTER — Ambulatory Visit: Payer: Medicare Other | Admitting: Physical Therapy

## 2020-08-10 ENCOUNTER — Encounter: Payer: Self-pay | Admitting: Family Medicine

## 2020-08-10 ENCOUNTER — Other Ambulatory Visit: Payer: Self-pay

## 2020-08-10 DIAGNOSIS — R293 Abnormal posture: Secondary | ICD-10-CM

## 2020-08-10 DIAGNOSIS — M6281 Muscle weakness (generalized): Secondary | ICD-10-CM | POA: Diagnosis not present

## 2020-08-10 DIAGNOSIS — M25512 Pain in left shoulder: Secondary | ICD-10-CM | POA: Diagnosis not present

## 2020-08-10 NOTE — Therapy (Signed)
[facility] [place], [place], [address] Phone: [phone]   Fax:  [phone]  Physical Therapy Treatment  Patient Details  Name: Anna [name] MRN: [id number] Date of Birth: 09/28/1935 Referring Provider (PT): Aundria Mems, MD   Encounter Date: 08/10/2020   PT End of Session - 08/10/20 1238    Visit Number 4    Number of Visits 12    Date for PT Re-Evaluation 09/08/20    Authorization Type UHC medicare    PT Start Time 1150    PT Stop Time 2423    PT Time Calculation (min) 45 min    Activity Tolerance Patient tolerated treatment well    Behavior During Therapy [facility] for tasks assessed/performed           Past Medical History:  Diagnosis Date  . Dermatitis    seborrehic scalp  . Hiatal hernia   . History of basal cell carcinoma 07/20/2017   On nose.   Marland Kitchen History of melanoma 07/20/2017   Right upper arm   . Hyperlipidemia   . Macular degeneration     Past Surgical History:  Procedure Laterality Date  . BREAST CYST EXCISION Right 09/1962  . CATARACT EXTRACTION  03/2015  . melonoma Right 06/2016   arm  . MOHS SURGERY Left 06/2016   nose    There were no vitals filed for this visit.   Subjective Assessment - 08/10/20 1157    Subjective Pt reports her shoulder was sore on Saturday after last session.  She iced it and didn't do much.  Used biofreeze for pain. Today, feeling much better.    Patient Stated Goals More mobility in the arm, be able to reduce pain.    Currently in Pain? No/denies              [facility] PT Assessment - 08/10/20 0001      Assessment   Medical Diagnosis Acute pain left shoulder    Referring Provider (PT) Aundria Mems, MD    Onset Date/Surgical Date 07/17/20    Hand Dominance Right    Next MD Visit 08/28/20    Prior Therapy known from prior PT for L shoulder and back pain      ROM / Strength   AROM / PROM / Strength AAROM - supine Lt shoulder  flexion - 160 deg (with cane)           [facility] Adult PT Treatment/Exercise - 08/10/20 0001      Shoulder Exercises: Supine   Flexion AAROM;Both;10 reps   cane   ABduction AAROM;Left;5 reps   cane; some discomfort     Shoulder Exercises: Seated   External Rotation Both;10 reps    Theraband Level (Shoulder External Rotation) Level 1 (Yellow)    Flexion Strengthening;Left;Weights;5 reps   to shoulder height,    Flexion Weight (lbs) 1,2    Abduction Strengthening;Left;Right;10 reps   to shoulder height   ABduction Weight (lbs) 1   (2 sets of 5),    Other Seated Exercises bicep curl to overhead press with 2# x 5 reps, 1# x 5 reps, 2# x 5 with RUE      Shoulder Exercises: Standing   Internal Rotation Both;10 reps;AAROM   dowel behind back   Extension AAROM;Both;10 reps   dowel behind back   Other Standing Exercises reverse wall push up x 5 reps      Shoulder Exercises: ROM/Strengthening   UBE (Upper Arm Bike) L3: 45 sec forward,  45 sec backward      Shoulder Exercises: Stretch   Other Shoulder Stretches low and midlevel doorway stretch x 15-30 sec       Manual Therapy   Manual Therapy Soft tissue mobilization    Manual therapy comments Pt in supported supine    Soft tissue mobilization STM to Lt pec minor, Lt periscapular musculature  to decrease fascial restrictions and improve mobility.                   PT Education - 08/10/20 1249    Education Details HEP - removed W's and added reverse wall push up.  Modified to stretches daily, strengtheing 3x/wk.    Person(s) Educated Patient    Methods Explanation;Handout;Demonstration;Verbal cues    Comprehension Verbalized understanding;Returned demonstration               PT Long Term Goals - 08/10/20 1248      PT LONG TERM GOAL #1   Title The patient will be indep with HEP.    Time 6    Period Weeks    Status On-going      PT LONG TERM GOAL #2   Title The patient will reduce limitation per FOTO from 41% to < or  equal to 36%.    Time 6    Period Weeks    Status On-going      PT LONG TERM GOAL #3   Title The patient will improve L shoulder flexion to 160 degrees.    Time 6    Period Weeks    Status Achieved      PT LONG TERM GOAL #4   Title The patient will improve L shoulder strength to 4+/5 for flexion and abduction.    Time 6    Period Weeks    Status On-going      PT LONG TERM GOAL #5   Title The patient will report sleeping through the night without waking due to pain.    Time 6    Period Weeks    Status Achieved                 Plan - 08/10/20 1248    Clinical Impression Statement Pt able to complete some exercises with 2# instead of 1#.  All exercises completed without pain, just fatigue.  Pt has met LTG#3 with improved Lt shoulder flexion.  Progressing well.    Examination-Activity Limitations Reach Overhead;Lift;Sleep    Examination-Participation Restrictions Meal Prep;Cleaning    Stability/Clinical Decision Making Stable/Uncomplicated    Rehab Potential Good    PT Frequency 2x / week    PT Duration 6 weeks    PT Treatment/Interventions ADLs/Self Care Home Management;Therapeutic exercise;Therapeutic activities;Patient/family education;Cryotherapy;Moist Heat;Electrical Stimulation;Taping;Manual techniques;Dry needling;Passive range of motion    PT Next Visit Plan continue progressive Lt shoulder ROM/ strengthening.    PT Home Exercise Plan Access Code: [email or web address]    Consulted and Agree with Plan of Care Patient           Patient will benefit from skilled therapeutic intervention in order to improve the following deficits and impairments:  Pain, Decreased range of motion, Increased fascial restricitons, Impaired flexibility, Decreased strength, Postural dysfunction  Visit Diagnosis: Acute pain of left shoulder  Muscle weakness (generalized)  Abnormal posture     Problem List Patient Active Problem List   Diagnosis Date Noted  . Anorexia 06/30/2020  .  Umbilical hernia without obstruction and without gangrene 06/30/2020  . Meralgia paresthetica of right  side 06/22/2020  . Venous insufficiency of both lower extremities 06/22/2020  . Primary osteoarthritis, left shoulder 06/03/2020  . Bilateral lower extremity edema 05/25/2020  . Aortic atherosclerosis ([place]) 05/13/2020  . Bunion, right foot 11/22/2019  . Lichen planus 30/94/0768  . Asthma in adult 11/22/2019  . Mixed restrictive and obstructive lung disease ([place]) 11/22/2019  . Primary osteoarthritis involving multiple joints 11/22/2019  . Acute pain of left shoulder 09/05/2019  . Chronic bilateral low back pain without sciatica 09/05/2019  . OAB (overactive bladder) 08/08/2019  . Insomnia 08/08/2019  . History of melanoma 07/20/2017  . History of basal cell carcinoma 07/20/2017  . IFG (impaired fasting glucose) 07/17/2017  . Macular degeneration   . Chronic fatigue 08/06/2015  . Gastroesophageal reflux disease without esophagitis 08/06/2015  . Glaucoma 08/06/2015  . Hiatal hernia 08/06/2015  . Melanoma of right upper arm ([place]) 08/06/2015  . Mixed hyperlipidemia 08/06/2015  . Rupture of right biceps tendon 08/06/2015  . Biceps tendonitis of both shoulders 08/06/2015    Kerin Perna, PTA 08/10/20 12:52 PM  [place], [place], [address] Phone: [phone]   Fax:  [phone]  Name: Anna [name] MRN: [id number] Date of Birth: 16-Mar-1936

## 2020-08-11 DIAGNOSIS — H401131 Primary open-angle glaucoma, bilateral, mild stage: Secondary | ICD-10-CM | POA: Diagnosis not present

## 2020-08-11 DIAGNOSIS — H353211 Exudative age-related macular degeneration, right eye, with active choroidal neovascularization: Secondary | ICD-10-CM | POA: Diagnosis not present

## 2020-08-13 ENCOUNTER — Encounter: Payer: Medicare Other | Admitting: Physical Therapy

## 2020-08-17 ENCOUNTER — Encounter: Payer: Medicare Other | Admitting: Physical Therapy

## 2020-08-18 ENCOUNTER — Ambulatory Visit (INDEPENDENT_AMBULATORY_CARE_PROVIDER_SITE_OTHER): Payer: Medicare Other | Admitting: Physical Therapy

## 2020-08-18 ENCOUNTER — Other Ambulatory Visit: Payer: Self-pay

## 2020-08-18 DIAGNOSIS — M6281 Muscle weakness (generalized): Secondary | ICD-10-CM | POA: Diagnosis not present

## 2020-08-18 DIAGNOSIS — M25512 Pain in left shoulder: Secondary | ICD-10-CM

## 2020-08-18 DIAGNOSIS — M545 Low back pain, unspecified: Secondary | ICD-10-CM | POA: Diagnosis not present

## 2020-08-18 DIAGNOSIS — R293 Abnormal posture: Secondary | ICD-10-CM

## 2020-08-18 NOTE — Therapy (Addendum)
[place], [place], [address] Phone: [phone]   Fax:  [phone]  Physical Therapy Treatment and Discharge Summary  Patient Details  Name: Anna [name] MRN: [id number] Date of Birth: [birth year] Referring Provider (PT): Aundria Mems, MD    PHYSICAL THERAPY DISCHARGE SUMMARY  Visits from Start of Care: 5  Current functional level related to goals / functional outcomes: See goals below.   Remaining deficits: See note below for last known patient status.   Education / Equipment: HEP  Plan: Patient agrees to discharge.  Patient goals were met. Patient is being discharged due to meeting the stated rehab goals.  ?????          Thank you for the referral of this patient. Rudell Cobb, MPT  Encounter Date: 08/18/2020   PT End of Session - 08/18/20 1113    Visit Number 5    Number of Visits 12    Date for PT Re-Evaluation 09/08/20    Authorization Type UHC medicare    PT Start Time 1109    PT Stop Time 1149    PT Time Calculation (min) 40 min    Activity Tolerance Patient tolerated treatment well    Behavior During Therapy [facility] for tasks assessed/performed           Past Medical History:  Diagnosis Date  . Dermatitis    seborrehic scalp  . Hiatal hernia   . History of basal cell carcinoma 07/20/2017   On nose.   Marland Kitchen History of melanoma 07/20/2017   Right upper arm   . Hyperlipidemia   . Macular degeneration     Past Surgical History:  Procedure Laterality Date  . BREAST CYST EXCISION Right 09/1962  . CATARACT EXTRACTION  03/2015  . melonoma Right 06/2016   arm  . MOHS SURGERY Left 06/2016   nose    There were no vitals filed for this visit.   Subjective Assessment - 08/18/20 1218    Subjective Pt reports she is doing very well. Sleeping well.  Has been pain free.  She requests to hold therapy after today's visit.    Patient Stated Goals More mobility  in the arm, be able to reduce pain.    Currently in Pain? No/denies              [facility] PT Assessment - 08/18/20 0001      Assessment   Medical Diagnosis Acute pain left shoulder    Referring Provider (PT) Aundria Mems, MD    Onset Date/Surgical Date 07/17/20    Hand Dominance Right    Next MD Visit 08/28/20    Prior Therapy known from prior PT for L shoulder and back pain      Observation/Other Assessments   Focus on Therapeutic Outcomes (FOTO)  22% limited      Strength   Right/Left Shoulder Left    Left Shoulder Flexion 4+/5    Left Shoulder Extension 5/5    Left Shoulder ABduction 4+/5    Left Shoulder Internal Rotation 5/5    Left Shoulder External Rotation 4+/5             [facility] Adult PT Treatment/Exercise - 08/18/20 0001      Self-Care   Other Self-Care Comments  pt educated in technique of self massage with ball and theracane to pec and periscapular musculature.  Pt returned demo with cues.       Shoulder Exercises: Supine   Horizontal ABduction Strengthening;Both;5  reps    Theraband Level (Shoulder Horizontal ABduction) Level 1 (Yellow)    External Rotation Both;10 reps    Theraband Level (Shoulder External Rotation) Level 1 (Yellow)    Diagonals Left;5 reps    Theraband Level (Shoulder Diagonals) Level 1 (Yellow)      Shoulder Exercises: Seated   Flexion Strengthening;Left;Weights;5 reps   to shoulder height, 2 sets   Flexion Weight (lbs) 3,2    Abduction Strengthening;Left;Right;5 reps   to shoulder height, 2 sets   ABduction Weight (lbs) 2, 1    Diagonals Left;Right;5 reps    Other Seated Exercises Lt bicep curl to overhead press x 5 reps with 3#    Other Seated Exercises reverse wall push up x 10       Shoulder Exercises: Sidelying   Other Sidelying Exercises open book (no resistance) x 5 reps each side.       Shoulder Exercises: Standing   Extension AAROM;Both;10 reps   dowel behind back     Shoulder Exercises: ROM/Strengthening   UBE  (Upper Arm Bike) L2: 1 min each direction , standing       Shoulder Exercises: Stretch   Wall Stretch - Flexion 5 reps;10 seconds    Other Shoulder Stretches low and midlevel doorway stretch x 15-20 sec x 2 reps each                        PT Long Term Goals - 08/18/20 1220      PT LONG TERM GOAL #1   Title The patient will be indep with HEP.    Time 6    Period Weeks    Status Achieved      PT LONG TERM GOAL #2   Title The patient will reduce limitation per FOTO from 41% to < or equal to 36%.    Time 6    Period Weeks    Status Achieved      PT LONG TERM GOAL #3   Title The patient will improve L shoulder flexion to 160 degrees.    Time 6    Period Weeks    Status Achieved      PT LONG TERM GOAL #4   Title The patient will improve L shoulder strength to 4+/5 for flexion and abduction.    Time 6    Period Weeks    Status Achieved      PT LONG TERM GOAL #5   Title The patient will report sleeping through the night without waking due to pain.    Time 6    Period Weeks    Status Achieved                 Plan - 08/18/20 1125    Clinical Impression Statement Pt demonstrating improved Lt shoulder ROM and strength. She was able to tolerate increased resistance with exercises without any production of pain.   She has met remaining goals and requests to hold therapy while she continues to complete HEP.    Examination-Activity Limitations Reach Overhead;Lift;Sleep    Examination-Participation Restrictions Meal Prep;Cleaning    Stability/Clinical Decision Making Stable/Uncomplicated    Rehab Potential Good    PT Frequency 2x / week    PT Duration 6 weeks    PT Treatment/Interventions ADLs/Self Care Home Management;Therapeutic exercise;Therapeutic activities;Patient/family education;Cryotherapy;Moist Heat;Electrical Stimulation;Taping;Manual techniques;Dry needling;Passive range of motion    PT Next Visit Plan hold therapy per pt request; will d/c if pt  doesn't return.  PT Home Exercise Plan Access Code: [email or web address]    Consulted and Agree with Plan of Care Patient           Patient will benefit from skilled therapeutic intervention in order to improve the following deficits and impairments:  Pain, Decreased range of motion, Increased fascial restricitons, Impaired flexibility, Decreased strength, Postural dysfunction  Visit Diagnosis: Acute pain of left shoulder  Muscle weakness (generalized)  Abnormal posture  Acute bilateral low back pain without sciatica     Problem List Patient Active Problem List   Diagnosis Date Noted  . Anorexia 06/30/2020  . Umbilical hernia without obstruction and without gangrene 06/30/2020  . Meralgia paresthetica of right side 06/22/2020  . Venous insufficiency of both lower extremities 06/22/2020  . Primary osteoarthritis, left shoulder 06/03/2020  . Bilateral lower extremity edema 05/25/2020  . Aortic atherosclerosis ([place]) 05/13/2020  . Bunion, right foot 11/22/2019  . Lichen planus 68/38/7065  . Asthma in adult 11/22/2019  . Mixed restrictive and obstructive lung disease ([place]) 11/22/2019  . Primary osteoarthritis involving multiple joints 11/22/2019  . Acute pain of left shoulder 09/05/2019  . Chronic bilateral low back pain without sciatica 09/05/2019  . OAB (overactive bladder) 08/08/2019  . Insomnia 08/08/2019  . History of melanoma 07/20/2017  . History of basal cell carcinoma 07/20/2017  . IFG (impaired fasting glucose) 07/17/2017  . Macular degeneration   . Chronic fatigue 08/06/2015  . Gastroesophageal reflux disease without esophagitis 08/06/2015  . Glaucoma 08/06/2015  . Hiatal hernia 08/06/2015  . Melanoma of right upper arm ([place]) 08/06/2015  . Mixed hyperlipidemia 08/06/2015  . Rupture of right biceps tendon 08/06/2015  . Biceps tendonitis of both shoulders 08/06/2015   Kerin Perna, PTA 08/18/20 12:20 PM  [place], [place], [address] Phone: [phone]   Fax:  [phone]  Name: Anna [name] MRN: [id number] Date of Birth: 06-18-1936

## 2020-08-26 ENCOUNTER — Encounter: Payer: Medicare Other | Admitting: Rehabilitative and Restorative Service Providers"

## 2020-08-26 ENCOUNTER — Ambulatory Visit: Payer: Medicare Other

## 2020-08-28 ENCOUNTER — Ambulatory Visit: Payer: Medicare Other | Admitting: Sports Medicine

## 2020-09-07 DIAGNOSIS — D1801 Hemangioma of skin and subcutaneous tissue: Secondary | ICD-10-CM | POA: Diagnosis not present

## 2020-09-07 DIAGNOSIS — L821 Other seborrheic keratosis: Secondary | ICD-10-CM | POA: Diagnosis not present

## 2020-09-07 DIAGNOSIS — B079 Viral wart, unspecified: Secondary | ICD-10-CM | POA: Diagnosis not present

## 2020-09-07 DIAGNOSIS — Z85820 Personal history of malignant melanoma of skin: Secondary | ICD-10-CM | POA: Diagnosis not present

## 2020-09-07 DIAGNOSIS — B351 Tinea unguium: Secondary | ICD-10-CM | POA: Diagnosis not present

## 2020-09-09 ENCOUNTER — Ambulatory Visit (INDEPENDENT_AMBULATORY_CARE_PROVIDER_SITE_OTHER): Payer: Medicare Other

## 2020-09-09 ENCOUNTER — Other Ambulatory Visit: Payer: Self-pay

## 2020-09-09 DIAGNOSIS — Z1231 Encounter for screening mammogram for malignant neoplasm of breast: Secondary | ICD-10-CM | POA: Diagnosis not present

## 2020-09-10 ENCOUNTER — Other Ambulatory Visit: Payer: Self-pay | Admitting: Nurse Practitioner

## 2020-09-10 DIAGNOSIS — I872 Venous insufficiency (chronic) (peripheral): Secondary | ICD-10-CM

## 2020-09-10 DIAGNOSIS — G5711 Meralgia paresthetica, right lower limb: Secondary | ICD-10-CM

## 2020-09-15 ENCOUNTER — Other Ambulatory Visit: Payer: Self-pay | Admitting: Family Medicine

## 2020-09-15 DIAGNOSIS — R928 Other abnormal and inconclusive findings on diagnostic imaging of breast: Secondary | ICD-10-CM

## 2020-09-25 DIAGNOSIS — B079 Viral wart, unspecified: Secondary | ICD-10-CM | POA: Diagnosis not present

## 2020-10-01 ENCOUNTER — Other Ambulatory Visit: Payer: Self-pay

## 2020-10-01 ENCOUNTER — Ambulatory Visit
Admission: RE | Admit: 2020-10-01 | Discharge: 2020-10-01 | Disposition: A | Discharge status: Home / Self Care | Payer: Medicare Other | Source: Ambulatory Visit | Attending: Family Medicine | Admitting: Family Medicine

## 2020-10-01 ENCOUNTER — Ambulatory Visit: Payer: Medicare Other

## 2020-10-01 DIAGNOSIS — R928 Other abnormal and inconclusive findings on diagnostic imaging of breast: Secondary | ICD-10-CM | POA: Diagnosis not present

## 2020-10-01 DIAGNOSIS — R922 Inconclusive mammogram: Secondary | ICD-10-CM | POA: Diagnosis not present

## 2020-11-05 ENCOUNTER — Other Ambulatory Visit: Payer: Self-pay | Admitting: Family Medicine

## 2020-11-05 DIAGNOSIS — J4540 Moderate persistent asthma, uncomplicated: Secondary | ICD-10-CM

## 2020-11-27 ENCOUNTER — Telehealth: Payer: Self-pay | Admitting: Neurology

## 2020-11-27 ENCOUNTER — Other Ambulatory Visit: Payer: Self-pay

## 2020-11-27 ENCOUNTER — Encounter: Payer: Self-pay | Admitting: Physician Assistant

## 2020-11-27 ENCOUNTER — Ambulatory Visit (INDEPENDENT_AMBULATORY_CARE_PROVIDER_SITE_OTHER): Payer: Medicare Other | Admitting: Physician Assistant

## 2020-11-27 VITALS — BP 141/73 | HR 79 | Ht 63.0 in | Wt 175.0 lb

## 2020-11-27 DIAGNOSIS — N898 Other specified noninflammatory disorders of vagina: Secondary | ICD-10-CM | POA: Diagnosis not present

## 2020-11-27 DIAGNOSIS — K429 Umbilical hernia without obstruction or gangrene: Secondary | ICD-10-CM

## 2020-11-27 LAB — WET PREP FOR TRICH, YEAST, CLUE
MICRO NUMBER:: 11608679
Specimen Quality: ADEQUATE

## 2020-11-27 MED ORDER — FLUCONAZOLE 150 MG PO TABS: 150.0000 mg | ORAL_TABLET | Freq: Once | ORAL | 0 refills | Status: AC

## 2020-11-27 NOTE — Telephone Encounter (Signed)
Ref signed and sent.

## 2020-11-27 NOTE — Progress Notes (Signed)
No yeast was seen but I still want you to take the one diflucan and see if helps symptoms. Let me know if symptoms not improving.

## 2020-11-27 NOTE — Patient Instructions (Signed)
Vaginitis  Vaginitis is a condition in which the vaginal tissue swells and becomes irritated. This condition is most often caused by a change in the normal balance of bacteria and yeast that live in the vagina. This change causes an overgrowth of certain bacteria or yeast, which causes the inflammation. There are different types of vaginitis. What are the causes? The cause of this condition depends on the type of vaginitis. It can be caused by:  Bacteria (bacterial vaginosis).  Yeast, which is a fungus (candidiasis).  A parasite (trichomoniasis vaginitis).  A virus (viral vaginitis).  Low hormone levels (atrophic vaginitis). Low hormone levels can occur during pregnancy, breastfeeding, or after menopause.  Irritants, such as bubble baths, scented tampons, and feminine sprays (allergic vaginitis). Other factors can change the normal balance of the yeast and bacteria that live in the vagina. These include:  Antibiotic medicines.  Poor hygiene.  Diaphragms, vaginal sponges, spermicides, birth control pills, and intrauterine devices (IUDs).  Sex.  Infection.  Uncontrolled diabetes.  A weakened body defense system (immune system). What increases the risk? This condition is more likely to develop in women who:  Smoke or are exposed to secondhand smoke.  Use vaginal douches, scented tampons, or scented sanitary pads.  Wear tight-fitting pants or thong underwear.  Use oral birth control pills or an IUD.  Have sex without a condom or have multiple partners.  Have an STI.  Frequently use the spermicide nonoxynol-9.  Eat lots of foods high in sugar or who have uncontrolled diabetes.  Have low estrogen levels.  Have a weakened immune system from an immune disorder or medical treatment.  Are pregnant or breastfeeding. What are the signs or symptoms? Symptoms vary depending on the cause of the vaginitis. Common symptoms include:  Abnormal vaginal discharge. ? The  discharge is white, gray, or yellow with bacterial vaginosis. ? The discharge is thick, white, and cheesy with a yeast infection. ? The discharge is frothy and yellow or greenish with trichomoniasis.  A bad vaginal smell. The smell is fishy with bacterial vaginosis.  Vaginal itching, pain, or swelling.  Pain with sex.  Pain or burning when urinating. Sometimes there are no symptoms. How is this diagnosed? This condition is diagnosed based on your symptoms and medical history. A physical exam, including a pelvic exam, will also be done. You may also have other tests, including:  Tests to determine the pH level (acidity or alkalinity) of your vagina.  A whiff test to assess the odor that results when a sample of your vaginal discharge is mixed with a potassium hydroxide solution.  Tests of vaginal fluid. A sample will be examined under a microscope. How is this treated? Treatment varies depending on the type of vaginitis you have. Your treatment may include:  Antibiotic creams or pills to treat bacterial vaginosis and trichomoniasis.  Antifungal medicines, such as vaginal creams or suppositories, to treat a yeast infection.  Medicine to ease discomfort if you have viral vaginitis. Your sexual partner should also be treated.  Estrogen delivered in a cream, pill, suppository, or vaginal ring to treat atrophic vaginitis. If vaginal dryness occurs, lubricants and moisturizing creams may help. You may need to avoid scented soaps, sprays, or douches.  Stopping use of a product that is causing allergic vaginitis and then using a vaginal cream to treat the symptoms. Follow these instructions at home: Lifestyle  Keep your genital area clean and dry. Avoid soap, and only rinse the area with water.  Do not douche  or use tampons until your health care provider says it is okay. Use sanitary pads, if needed.  Do not have sex until your health care provider approves. When you can return to sex,  practice safe sex and use condoms.  Wipe from front to back. This avoids the spread of bacteria from the rectum to the vagina. General instructions  Take over-the-counter and prescription medicines only as told by your health care provider.  If you were prescribed an antibiotic medicine, take or use it as told by your health care provider. Do not stop taking or using the antibiotic even if you start to feel better.  Keep all follow-up visits. This is important. How is this prevented?  Use mild, unscented products. Do not use things that can irritate the vagina, such as fabric softeners. Avoid the following products if they are scented: ? Feminine sprays. ? Detergents. ? Tampons. ? Feminine hygiene products. ? Soaps or bubble baths.  Let air reach your genital area. To do this: ? Wear cotton underwear to reduce moisture buildup. ? Avoid wearing underwear while you sleep. ? Avoid wearing tight pants and underwear or nylons without a cotton panel. ? Avoid wearing thong underwear.  Take off any wet clothing, such as bathing suits, as soon as possible.  Practice safe sex and use condoms. Contact a health care provider if:  You have abdominal or pelvic pain.  You have a fever or chills.  You have symptoms that last for more than 2-3 days. Get help right away if:  You have a fever and your symptoms suddenly get worse. Summary  Vaginitis is a condition in which the vaginal tissue becomes inflamed.This condition is most often caused by a change in the normal balance of bacteria and yeast that live in the vagina.  Treatment varies depending on the type of vaginitis you have.  Do not douche, use tampons, or have sex until your health care provider approves. When you can return to sex, practice safe sex and use condoms. This information is not intended to replace advice given to you by your health care provider. Make sure you discuss any questions you have with your health care  provider. Document Revised: 03/12/2020 Document Reviewed: 03/12/2020 Elsevier Patient Education  [place].

## 2020-11-27 NOTE — Progress Notes (Signed)
   Subjective:    Patient ID: Anna [name], female    DOB: [birth year], 85 y.o.   MRN: [id number]  HPI  Pt is a 85 yo female who presents to the clinic with vaginal external itching. She denies any urinary symptoms. No abdominal, flank pain. No abnormal bleeding. No fever, chills, body aches.  She has been itching for 3 weeks. She denies any change is soaps, underwear, lotions, detergents, medication. No recent antibiotic. She has never had sex and denies any pain with intercourse. She tried vagilsil and it did feel a little better but itching persist.   .. Active Ambulatory Problems    Diagnosis Date Noted  . Chronic fatigue 08/06/2015  . Gastroesophageal reflux disease without esophagitis 08/06/2015  . Glaucoma 08/06/2015  . Hiatal hernia 08/06/2015  . Melanoma of right upper arm ([place]) 08/06/2015  . Mixed hyperlipidemia 08/06/2015  . Rupture of right biceps tendon 08/06/2015  . Biceps tendonitis of both shoulders 08/06/2015  . IFG (impaired fasting glucose) 07/17/2017  . Macular degeneration   . History of melanoma 07/20/2017  . History of basal cell carcinoma 07/20/2017  . OAB (overactive bladder) 08/08/2019  . Insomnia 08/08/2019  . Acute pain of left shoulder 09/05/2019  . Chronic bilateral low back pain without sciatica 09/05/2019  . Bunion, right foot 11/22/2019  . Lichen planus 54/62/7035  . Asthma in adult 11/22/2019  . Mixed restrictive and obstructive lung disease ([place]) 11/22/2019  . Primary osteoarthritis involving multiple joints 11/22/2019  . Aortic atherosclerosis ([place]) 05/13/2020  . Bilateral lower extremity edema 05/25/2020  . Primary osteoarthritis, left shoulder 06/03/2020  . Meralgia paresthetica of right side 06/22/2020  . Venous insufficiency of both lower extremities 06/22/2020  . Anorexia 06/30/2020  . Umbilical hernia without obstruction and without gangrene 06/30/2020   Resolved Ambulatory Problems    Diagnosis Date Noted  . Spontaneous ecchymoses  03/16/2020   Past Medical History:  Diagnosis Date  . Dermatitis   . Hyperlipidemia         Review of Systems   see HPI.  Objective:   Physical Exam Vitals reviewed.  Constitutional:      Appearance: Normal appearance. She is obese.  Pulmonary:     Effort: Pulmonary effort is normal.  Abdominal:     General: Bowel sounds are normal. There is no distension.     Palpations: Abdomen is soft.     Tenderness: There is no abdominal tenderness. There is no right CVA tenderness, left CVA tenderness or guarding.  Genitourinary:    Comments: Erythematous vulva with tenderness to wet prep swab.  Scant clumpy discharge around urethra and clitoris.  Neurological:     General: No focal deficit present.     Mental Status: She is alert.  Psychiatric:        Mood and Affect: Mood normal.           Assessment & Plan:  Marland KitchenMarland KitchenKyran was seen today for vaginal itching.  Diagnoses and all orders for this visit:  Vaginal itching -     fluconazole (DIFLUCAN) 150 MG tablet; Take 1 tablet (150 mg total) by mouth once for 1 dose. -     WET PREP FOR TRICH, YEAST, CLUE   Some scant discharge noted wither erythema. Suspect yeast. Wet prep ordered.  Treated empirically with diflucan.  Follow up as needed or if symptoms persist.

## 2020-11-27 NOTE — Telephone Encounter (Signed)
Patient was seen in the office today and after visit she states she is ready to see general surgery about getting her hernia fixed. Please sign referral if appropriate.

## 2020-12-03 ENCOUNTER — Telehealth: Payer: Self-pay

## 2020-12-03 NOTE — Telephone Encounter (Signed)
Patient advised and scheduled.  

## 2020-12-03 NOTE — Telephone Encounter (Signed)
K, lets have her come in and do the sure swab.  And if that does not show anything shown then she will need a pelvic exam.  Please see if she is noticed any rash

## 2020-12-03 NOTE — Telephone Encounter (Signed)
Anna [name] called and left a message stating she is still having vaginal itching. She was advised to call back if symptoms did not resolve. Please advise.

## 2020-12-04 ENCOUNTER — Other Ambulatory Visit: Payer: Self-pay

## 2020-12-04 ENCOUNTER — Ambulatory Visit (INDEPENDENT_AMBULATORY_CARE_PROVIDER_SITE_OTHER): Payer: Medicare Other | Admitting: Family Medicine

## 2020-12-04 VITALS — BP 135/88 | HR 78 | Temp 98.6°F | Resp 20 | Ht 63.0 in | Wt 175.0 lb

## 2020-12-04 DIAGNOSIS — N898 Other specified noninflammatory disorders of vagina: Secondary | ICD-10-CM

## 2020-12-04 NOTE — Progress Notes (Signed)
Established Patient Office Visit  Subjective:  Patient ID: Anna [name], female    DOB: 1936/06/11  Age: 85 y.o. MRN: [id number]  CC:  Chief Complaint  Patient presents with  . Vaginal Itching    HPI Laporshia Hogen presents for a SureSwab. Pt self swabbed with instructions.   Past Medical History:  Diagnosis Date  . Dermatitis    seborrehic scalp  . Hiatal hernia   . History of basal cell carcinoma 07/20/2017   On nose.   Marland Kitchen History of melanoma 07/20/2017   Right upper arm   . Hyperlipidemia   . Macular degeneration     Past Surgical History:  Procedure Laterality Date  . BREAST CYST EXCISION Right 09/1962  . CATARACT EXTRACTION  03/2015  . melonoma Right 06/2016   arm  . MOHS SURGERY Left 06/2016   nose    Family History  Problem Relation Age of Onset  . Hyperlipidemia Sister   . Macular degeneration Sister   . Cancer - Prostate Brother   . Atrial fibrillation Maternal Aunt   . Cancer Mother        pancreatic  . Cancer Father        liver    Social History   Socioeconomic History  . Marital status: Single    Spouse name: Not on file  . Number of children: Not on file  . Years of education: 12+  . Highest education level: Master's degree (e.g., MA, MS, MEng, MEd, MSW, MBA)  Occupational History  . 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  Tobacco Use  . Smoking status: Never Smoker  . Smokeless tobacco: Never Used  Vaping Use  . Vaping Use: Never used  Substance and Sexual Activity  . Alcohol use: Yes    Alcohol/week: 1.0 standard drink    Types: 1 Glasses of wine per week    Comment: occasionally  . Drug use: No  . Sexual activity: Never  Other Topics Concern  . Not on file  Social History Narrative   She has a Scientist, water quality in Film/video editor.  He lives alone in a retirement community.  Her sister is Pharmacist, community.    Social Determinants of Health   Financial Resource  Strain: Not on file  Food Insecurity: Not on file  Transportation Needs: Not on file  Physical Activity: Not on file  Stress: Not on file  Social Connections: Not on file  Intimate Partner Violence: Not on file    Outpatient Medications Prior to Visit  Medication Sig Dispense Refill  . albuterol (VENTOLIN HFA) 108 (90 Base) MCG/ACT inhaler Inhale 2 puffs into the lungs 2 (two) times daily.     Marland Kitchen ALPRAZolam (XANAX) 0.25 MG tablet TAKE ONE TABLET (0.25MG  TOTAL) BY MOUTH AT BEDTIME AS NEEDED FOR SLEEP 30 tablet 0  . AMBULATORY NON FORMULARY MEDICATION 20-30 mmHg Knee High Compression Stockings. 2 each 2  . ammonium lactate (AMLACTIN) 12 % cream Apply topically as needed for dry skin.    . Bevacizumab 2.75 MG/0.11ML SOSY Inject into the eye.    . calcium citrate-vitamin D (CITRACAL+D) 315-200 MG-UNIT tablet Take 1 tablet by mouth 2 (two) times daily.    . celecoxib (CELEBREX) 200 MG capsule TAKE ONE CAPSULE BY MOUTH ONCE A DAY 90 capsule 0  . COD LIVER OIL PO Take 1 mL by mouth daily.    . Flaxseed Oil OIL 1,000 mg.    Marland Kitchen  Fluticasone-Salmeterol (ADVAIR) 250-50 MCG/DOSE AEPB Inhale 1 puff into the lungs 2 (two) times daily. 60 each 5  . furosemide (LASIX) 20 MG tablet Take 1 tablet (20 mg total) by mouth as needed for edema. You may take 1 tablet up to once every day for leg swelling not improved with stockings. 30 tablet 3  . gabapentin (NEURONTIN) 100 MG capsule Take 1-3 capsules (100-300 mg) by mouth as needed at bedtime for nerve pain. May take 1 capsule (100 mg) up to twice during the day for nerve pain. 120 capsule 3  . ketoconazole (NIZORAL) 2 % shampoo USE THREE TIMES A WEEK AS DIRECTED    . latanoprost (XALATAN) 0.005 % ophthalmic solution Place 1 drop into both eyes daily.    Marland Kitchen lovastatin (MEVACOR) 40 MG tablet TAKE ONE TABLET BY MOUTH NIGHTLY 90 tablet 3  . meclizine (ANTIVERT) 25 MG tablet Take 1 tablet (25 mg total) by mouth 3 (three) times daily as needed for dizziness. 30 tablet 0   . Multiple Vitamins-Minerals (PRESERVISION AREDS 2) CAPS Take by mouth.    Marland Kitchen MYRBETRIQ 25 MG TB24 tablet Take 1 tablet (25 mg total) by mouth daily. 30 tablet 11  . omeprazole (PRILOSEC) 40 MG capsule Take 1 capsule (40 mg total) by mouth daily. 90 capsule 3  . sucralfate (CARAFATE) 1 g tablet TAKE 1 TABLET BY MOUTH  TWICE DAILY 180 tablet 3  . timolol (BETIMOL) 0.5 % ophthalmic solution Place 1 drop into the left eye 2 (two) times daily.    . timolol (TIMOPTIC) 0.5 % ophthalmic solution     . TURMERIC PO Take 1,000 mg by mouth.    . vitamin B-12 (CYANOCOBALAMIN) 1000 MCG tablet Take 1,000 mcg by mouth daily.     Marland Kitchen UNABLE TO FIND Right eye injection every 5-6 weeks for Macular Degeneration     No facility-administered medications prior to visit.    Allergies  Allergen Reactions  . Aleve [Naproxen Sodium]     Causes elevation in cholesterol   . Azithromycin Rash    ROS Review of Systems    Objective:    Physical Exam  BP 135/88 (BP Location: Left Arm, Patient Position: Sitting, Cuff Size: Normal)   Pulse 78   Temp 98.6 F (37 C) (Oral)   Resp 20   Ht 5\' 3"  (1.6 m)   Wt 175 lb (79.4 kg)   SpO2 99%   BMI 31.00 kg/m  Wt Readings from Last 3 Encounters:  12/04/20 175 lb (79.4 kg)  11/27/20 175 lb (79.4 kg)  06/30/20 187 lb 0.6 oz (84.8 kg)     Health Maintenance Due  Topic Date Due  . PNA vac Low Risk Adult (1 of 2 - PCV13) Never done  . COVID-19 Vaccine (2 - Booster for Janssen series) 02/13/2020    There are no preventive care reminders to display for this patient.  Lab Results  Component Value Date   TSH 0.69 05/12/2020   Lab Results  Component Value Date   WBC 6.4 05/12/2020   HGB 15.0 05/12/2020   HCT 45.5 (H) 05/12/2020   MCV 91.2 05/12/2020   PLT 238 05/12/2020   Lab Results  Component Value Date   NA 140 05/25/2020   K 3.8 05/25/2020   CO2 30 05/25/2020   GLUCOSE 103 (H) 05/25/2020   BUN 9 05/25/2020   CREATININE 0.77 05/25/2020   BILITOT  0.9 05/12/2020   ALKPHOS 63 11/14/2016   AST 28 05/12/2020   ALT 11  05/12/2020   PROT 6.0 (L) 05/12/2020   CALCIUM 9.0 05/25/2020   Lab Results  Component Value Date   CHOL 170 08/09/2019   Lab Results  Component Value Date   HDL 57 08/09/2019   Lab Results  Component Value Date   LDLCALC 91 08/09/2019   Lab Results  Component Value Date   TRIG 122 08/09/2019   Lab Results  Component Value Date   CHOLHDL 3.0 08/09/2019   Lab Results  Component Value Date   HGBA1C 5.5 03/16/2020      Assessment & Plan:  Pt self swabbed with instructions. Specimen sent to lab. Problem List Items Addressed This Visit   None     No orders of the defined types were placed in this encounter.   Follow-up: No follow-ups on file.    Ninfa Meeker, CMA

## 2020-12-04 NOTE — Progress Notes (Signed)
Agree with documentation as above.   Kayleigh Broadwell, MD  

## 2020-12-07 DIAGNOSIS — L603 Nail dystrophy: Secondary | ICD-10-CM | POA: Diagnosis not present

## 2020-12-07 DIAGNOSIS — M19079 Primary osteoarthritis, unspecified ankle and foot: Secondary | ICD-10-CM | POA: Diagnosis not present

## 2020-12-07 DIAGNOSIS — R269 Unspecified abnormalities of gait and mobility: Secondary | ICD-10-CM | POA: Diagnosis not present

## 2020-12-07 DIAGNOSIS — L851 Acquired keratosis [keratoderma] palmaris et plantaris: Secondary | ICD-10-CM | POA: Diagnosis not present

## 2020-12-07 DIAGNOSIS — M79673 Pain in unspecified foot: Secondary | ICD-10-CM | POA: Diagnosis not present

## 2020-12-07 DIAGNOSIS — B351 Tinea unguium: Secondary | ICD-10-CM | POA: Diagnosis not present

## 2020-12-10 LAB — SURESWAB BACTERIAL VAGINOSIS/ITIS
Atopobium vaginae: NOT DETECTED Log (cells/mL)
C. albicans, DNA: NOT DETECTED
C. glabrata, DNA: NOT DETECTED
C. parapsilosis, DNA: NOT DETECTED
C. tropicalis, DNA: NOT DETECTED
Gardnerella vaginalis: <4.7 Log (cells/mL)
LACTOBACILLUS SPECIES: NOT DETECTED Log (cells/mL)
MEGASPHAERA SPECIES: NOT DETECTED Log (cells/mL)
Trichomonas vaginalis RNA: NOT DETECTED

## 2020-12-11 MED ORDER — TRIAMCINOLONE ACETONIDE 0.1 % EX CREA: 1.0000 "application " | TOPICAL_CREAM | Freq: Two times a day (BID) | CUTANEOUS | 0 refills | Status: DC | PRN

## 2020-12-11 NOTE — Addendum Note (Signed)
Addended by: Beatrice Lecher D on: 12/11/2020 12:08 PM   Modules accepted: Orders

## 2020-12-23 DIAGNOSIS — K429 Umbilical hernia without obstruction or gangrene: Secondary | ICD-10-CM | POA: Diagnosis not present

## 2021-01-11 ENCOUNTER — Other Ambulatory Visit: Payer: Self-pay | Admitting: Family Medicine

## 2021-01-11 DIAGNOSIS — K219 Gastro-esophageal reflux disease without esophagitis: Secondary | ICD-10-CM

## 2021-02-11 DIAGNOSIS — H353211 Exudative age-related macular degeneration, right eye, with active choroidal neovascularization: Secondary | ICD-10-CM | POA: Diagnosis not present

## 2021-02-11 DIAGNOSIS — H35033 Hypertensive retinopathy, bilateral: Secondary | ICD-10-CM | POA: Diagnosis not present

## 2021-02-11 DIAGNOSIS — H353122 Nonexudative age-related macular degeneration, left eye, intermediate dry stage: Secondary | ICD-10-CM | POA: Diagnosis not present

## 2021-02-11 DIAGNOSIS — D3132 Benign neoplasm of left choroid: Secondary | ICD-10-CM | POA: Diagnosis not present

## 2021-02-11 DIAGNOSIS — H401131 Primary open-angle glaucoma, bilateral, mild stage: Secondary | ICD-10-CM | POA: Diagnosis not present

## 2021-02-11 DIAGNOSIS — H35372 Puckering of macula, left eye: Secondary | ICD-10-CM | POA: Diagnosis not present

## 2021-02-11 DIAGNOSIS — Z961 Presence of intraocular lens: Secondary | ICD-10-CM | POA: Diagnosis not present

## 2021-02-11 DIAGNOSIS — H527 Unspecified disorder of refraction: Secondary | ICD-10-CM | POA: Diagnosis not present

## 2021-02-11 DIAGNOSIS — H26493 Other secondary cataract, bilateral: Secondary | ICD-10-CM | POA: Diagnosis not present

## 2021-02-11 LAB — HM DIABETES EYE EXAM

## 2021-02-16 DIAGNOSIS — Z20822 Contact with and (suspected) exposure to covid-19: Secondary | ICD-10-CM | POA: Diagnosis not present

## 2021-02-16 DIAGNOSIS — K429 Umbilical hernia without obstruction or gangrene: Secondary | ICD-10-CM | POA: Diagnosis not present

## 2021-03-18 DIAGNOSIS — M19079 Primary osteoarthritis, unspecified ankle and foot: Secondary | ICD-10-CM | POA: Diagnosis not present

## 2021-03-18 DIAGNOSIS — B351 Tinea unguium: Secondary | ICD-10-CM | POA: Diagnosis not present

## 2021-03-18 DIAGNOSIS — M79673 Pain in unspecified foot: Secondary | ICD-10-CM | POA: Diagnosis not present

## 2021-03-18 DIAGNOSIS — L605 Yellow nail syndrome: Secondary | ICD-10-CM | POA: Diagnosis not present

## 2021-03-18 DIAGNOSIS — R2681 Unsteadiness on feet: Secondary | ICD-10-CM | POA: Diagnosis not present

## 2021-03-25 ENCOUNTER — Other Ambulatory Visit: Payer: Self-pay | Admitting: Family Medicine

## 2021-03-30 ENCOUNTER — Telehealth: Payer: Self-pay | Admitting: Family Medicine

## 2021-03-30 NOTE — Progress Notes (Signed)
  Chronic Care Management   Note  03/30/2021 Name: Madesyn Ast MRN: [id number] DOB: [birth year]  Meleana Commerford is a 85 y.o. year old female who is a primary care patient of Metheney, Rene Kocher, MD. I reached out to Bartolo Darter by phone today in response to a referral sent by Ms. Helmut Muster PCP, Hali Marry, MD.   Ms. Wehrli was given information about Chronic Care Management services today including:  CCM service includes personalized support from designated clinical staff supervised by her physician, including individualized plan of care and coordination with other care providers 24/7 contact phone numbers for assistance for urgent and routine care needs. Service will only be billed when office clinical staff spend 20 minutes or more in a month to coordinate care. Only one practitioner may furnish and bill the service in a calendar month. The patient may stop CCM services at any time (effective at the end of the month) by phone call to the office staff.   Patient did not agree to enrollment in care management services and does not wish to consider at this time.  Follow up plan:   Lauretta Grill [employer] Scheduler

## 2021-04-06 ENCOUNTER — Other Ambulatory Visit: Payer: Self-pay | Admitting: Family Medicine

## 2021-05-03 ENCOUNTER — Other Ambulatory Visit: Payer: Self-pay

## 2021-05-03 ENCOUNTER — Ambulatory Visit (INDEPENDENT_AMBULATORY_CARE_PROVIDER_SITE_OTHER): Payer: Medicare Other | Admitting: Physician Assistant

## 2021-05-03 ENCOUNTER — Encounter: Payer: Self-pay | Admitting: Physician Assistant

## 2021-05-03 ENCOUNTER — Ambulatory Visit (INDEPENDENT_AMBULATORY_CARE_PROVIDER_SITE_OTHER): Payer: Medicare Other

## 2021-05-03 VITALS — BP 140/86 | HR 85 | Ht 63.0 in | Wt 171.0 lb

## 2021-05-03 DIAGNOSIS — M542 Cervicalgia: Secondary | ICD-10-CM | POA: Insufficient documentation

## 2021-05-03 DIAGNOSIS — M47812 Spondylosis without myelopathy or radiculopathy, cervical region: Secondary | ICD-10-CM | POA: Diagnosis not present

## 2021-05-03 DIAGNOSIS — M4313 Spondylolisthesis, cervicothoracic region: Secondary | ICD-10-CM | POA: Diagnosis not present

## 2021-05-03 DIAGNOSIS — M47816 Spondylosis without myelopathy or radiculopathy, lumbar region: Secondary | ICD-10-CM | POA: Diagnosis not present

## 2021-05-03 MED ORDER — BACLOFEN 10 MG PO TABS: 10.0000 mg | ORAL_TABLET | Freq: Every day | ORAL | 2 refills | Status: DC

## 2021-05-03 MED ORDER — MELOXICAM 7.5 MG PO TABS: 7.5000 mg | ORAL_TABLET | Freq: Every day | ORAL | 0 refills | Status: DC

## 2021-05-03 NOTE — Progress Notes (Signed)
Subjective:    Patient ID: Anna [name], female    DOB: 09/20/1936, 85 y.o.   MRN: [id number]  HPI Patient is an 85 year old female with a past medical history of multiple joints being affected by osteoarthritis who presents today to the clinic with 3 months of left-sided base of the head and neck pain.  She denies any known injury.  She denies any radiation of pain into shoulder or arm.  She denies any problems with range of motion of left shoulder.  She has started having more of a headache off and on due to pain.  She has tried some icy hot gels, Tylenol, ibuprofen with little relief.  Pain does seem to be worse at night.  She does admit to having her bed elevated due to her hiatal hernia.  She does not have good supportive pillows.  She loves massages but they are too expensive.  .. Active Ambulatory Problems    Diagnosis Date Noted   Chronic fatigue 08/06/2015   Gastroesophageal reflux disease without esophagitis 08/06/2015   Glaucoma 08/06/2015   Hiatal hernia 08/06/2015   Melanoma of right upper arm ([place]) 08/06/2015   Mixed hyperlipidemia 08/06/2015   Rupture of right biceps tendon 08/06/2015   Biceps tendonitis of both shoulders 08/06/2015   IFG (impaired fasting glucose) 07/17/2017   Macular degeneration    History of melanoma 07/20/2017   History of basal cell carcinoma 07/20/2017   OAB (overactive bladder) 08/08/2019   Insomnia 08/08/2019   Acute pain of left shoulder 09/05/2019   Chronic bilateral low back pain without sciatica 09/05/2019   Bunion, right foot 0000000   Lichen planus 0000000   Asthma in adult 11/22/2019   Mixed restrictive and obstructive lung disease ([place]) 11/22/2019   Primary osteoarthritis involving multiple joints 11/22/2019   Aortic atherosclerosis ([place]) 05/13/2020   Bilateral lower extremity edema 05/25/2020   Primary osteoarthritis, left shoulder 06/03/2020   Meralgia paresthetica of right side 06/22/2020   Venous insufficiency of both  lower extremities 06/22/2020   Anorexia [id number]   Umbilical hernia without obstruction and without gangrene 06/30/2020   Neck pain 05/03/2021   Resolved Ambulatory Problems    Diagnosis Date Noted   Spontaneous ecchymoses 03/16/2020   Past Medical History:  Diagnosis Date   Dermatitis    Hyperlipidemia       Review of Systems See HPI.     Objective:   Physical Exam Vitals reviewed.  Constitutional:      Appearance: She is well-developed.  HENT:     Head: Normocephalic.     Mouth/Throat:     Mouth: Mucous membranes are moist.  Eyes:     Extraocular Movements: Extraocular movements intact.     Pupils: Pupils are equal, round, and reactive to light.  Neck:     Comments: Pain with ROM to the right with neck.  Tenderness to palpation at base of occipital area of head and through the cervical paraspinal muscles to the left.  NROM of left shoulder.  Cardiovascular:     Rate and Rhythm: Normal rate.     Heart sounds: Normal heart sounds.  Pulmonary:     Effort: Pulmonary effort is normal.  Neurological:     Mental Status: She is alert.  Psychiatric:        Mood and Affect: Mood normal.          Assessment & Plan:  Marland KitchenMarland KitchenIlianna was seen today for headache.  Diagnoses and all orders for this visit:  Neck pain -  DG Cervical Spine Complete; Future -     baclofen (LIORESAL) 10 MG tablet; Take 1 tablet (10 mg total) by mouth at bedtime. -     meloxicam (MOBIC) 7.5 MG tablet; Take 1 tablet (7.5 mg total) by mouth daily. In the morning.   Get cervical xray.  Start baclofen at bedtime.  Mobic daily for next week or so. Kidney function at last check looked great.  PT for massage, tens until, exercise.  Start with home exercises.  Get a good supportive pillow.  Consider massages.  Follow up in 4 weeks.

## 2021-05-03 NOTE — Patient Instructions (Signed)
Cervical Strain and Sprain Rehab ?Ask your health care provider which exercises are safe for you. Do exercises exactly as told by your health care provider and adjust them as directed. It is normal to feel mild stretching, pulling, tightness, or discomfort as you do these exercises. Stop right away if you feel sudden pain or your pain gets worse. Do not begin these exercises until told by your health care provider. ?Stretching and range-of-motion exercises ?Cervical side bending ? ?Using good posture, sit on a stable chair or stand up. ?Without moving your shoulders, slowly tilt your left / right ear to your shoulder until you feel a stretch in the opposite side neck muscles. You should be looking straight ahead. ?Hold for __________ seconds. ?Repeat with the other side of your neck. ?Repeat __________ times. Complete this exercise __________ times a day. ?Cervical rotation ? ?Using good posture, sit on a stable chair or stand up. ?Slowly turn your head to the side as if you are looking over your left / right shoulder. ?Keep your eyes level with the ground. ?Stop when you feel a stretch along the side and the back of your neck. ?Hold for __________ seconds. ?Repeat this by turning to your other side. ?Repeat __________ times. Complete this exercise __________ times a day. ?Thoracic extension and pectoral stretch ?Roll a towel or a small blanket so it is about 4 inches (10 cm) in diameter. ?Lie down on your back on a firm surface. ?Put the towel lengthwise, under your spine in the middle of your back. It should not be under your shoulder blades. The towel should line up with your spine from your middle back to your lower back. ?Put your hands behind your head and let your elbows fall out to your sides. ?Hold for __________ seconds. ?Repeat __________ times. Complete this exercise __________ times a day. ?Strengthening exercises ?Isometric upper cervical flexion ?Lie on your back with a thin pillow behind your head  and a small rolled-up towel under your neck. ?Gently tuck your chin toward your chest and nod your head down to look toward your feet. Do not lift your head off the pillow. ?Hold for __________ seconds. ?Release the tension slowly. Relax your neck muscles completely before you repeat this exercise. ?Repeat __________ times. Complete this exercise __________ times a day. ?Isometric cervical extension ? ?Stand about 6 inches (15 cm) away from a wall, with your back facing the wall. ?Place a soft object, about 6-8 inches (15-20 cm) in diameter, between the back of your head and the wall. A soft object could be a small pillow, a ball, or a folded towel. ?Gently tilt your head back and press into the soft object. Keep your jaw and forehead relaxed. ?Hold for __________ seconds. ?Release the tension slowly. Relax your neck muscles completely before you repeat this exercise. ?Repeat __________ times. Complete this exercise __________ times a day. ?Posture and body mechanics ?Body mechanics refers to the movements and positions of your body while you do your daily activities. Posture is part of body mechanics. Good posture and healthy body mechanics can help to relieve stress in your body's tissues and joints. Good posture means that your spine is in its natural S-curve position (your spine is neutral), your shoulders are pulled back slightly, and your head is not tipped forward. The following are general guidelines for applying improved posture and body mechanics to your everyday activities. ?Sitting ? ?When sitting, keep your spine neutral and keep your feet flat on the floor.   Use a footrest, if necessary, and keep your thighs parallel to the floor. Avoid rounding your shoulders, and avoid tilting your head forward. ?When working at a desk or a computer, keep your desk at a height where your hands are slightly lower than your elbows. Slide your chair under your desk so you are close enough to maintain good posture. ?When  working at a computer, place your monitor at a height where you are looking straight ahead and you do not have to tilt your head forward or downward to look at the screen. ?Standing ? ?When standing, keep your spine neutral and keep your feet about hip-width apart. Keep a slight bend in your knees. Your ears, shoulders, and hips should line up. ?When you do a task in which you stand in one place for a long time, place one foot up on a stable object that is 2-4 inches (5-10 cm) high, such as a footstool. This helps keep your spine neutral. ?Resting ?When lying down and resting, avoid positions that are most painful for you. Try to support your neck in a neutral position. You can use a contour pillow or a small rolled-up towel. Your pillow should support your neck but not push on it. ?This information is not intended to replace advice given to you by your health care provider. Make sure you discuss any questions you have with your health care provider. ?Document Revised: 01/02/2019 Document Reviewed: 06/13/2018 ?Elsevier Patient Education ? 2022 Elsevier Inc. ? ?

## 2021-05-04 ENCOUNTER — Encounter: Payer: Self-pay | Admitting: Physician Assistant

## 2021-05-04 DIAGNOSIS — M5030 Other cervical disc degeneration, unspecified cervical region: Secondary | ICD-10-CM | POA: Insufficient documentation

## 2021-05-04 NOTE — Progress Notes (Signed)
Lots and lots of arthritis and degenerative changes in spine. Lets see what physical therapy does for your pain. I would suggest making your 4 week follow up appt for Dr. Darene Lamer our sports medicine specialist to discuss your spine and future treatment.

## 2021-05-06 ENCOUNTER — Ambulatory Visit: Payer: Medicare Other | Admitting: Rehabilitative and Restorative Service Providers"

## 2021-05-06 ENCOUNTER — Encounter: Payer: Self-pay | Admitting: Rehabilitative and Restorative Service Providers"

## 2021-05-06 ENCOUNTER — Other Ambulatory Visit: Payer: Self-pay

## 2021-05-06 DIAGNOSIS — M436 Torticollis: Secondary | ICD-10-CM

## 2021-05-06 DIAGNOSIS — M542 Cervicalgia: Secondary | ICD-10-CM | POA: Diagnosis not present

## 2021-05-06 DIAGNOSIS — R29898 Other symptoms and signs involving the musculoskeletal system: Secondary | ICD-10-CM | POA: Diagnosis not present

## 2021-05-06 DIAGNOSIS — R293 Abnormal posture: Secondary | ICD-10-CM

## 2021-05-06 NOTE — Therapy (Signed)
[facility] [employer] [place], [place], [address] Phone: [phone]   Fax:  [phone]  Physical Therapy Evaluation  Patient Details  Name: Laurelyn Machado MRN: [id number] Date of Birth: Jan 31, 1936 Referring Provider (PT): Iran Planas, [place]   Encounter Date: 05/06/2021   PT End of Session - 05/06/21 1400     Visit Number 1    Number of Visits 12    Date for PT Re-Evaluation 06/17/21    PT Start Time [id number]    PT Stop Time 1445    PT Time Calculation (min) 46 min    Activity Tolerance Patient tolerated treatment well             Past Medical History:  Diagnosis Date   Dermatitis    seborrehic scalp   Hiatal hernia    History of basal cell carcinoma 07/20/2017   On nose.    History of melanoma 07/20/2017   Right upper arm    Hyperlipidemia    Macular degeneration     Past Surgical History:  Procedure Laterality Date   BREAST CYST EXCISION Right 09/1962   CATARACT EXTRACTION  03/2015   melonoma Right 06/2016   arm   MOHS SURGERY Left 06/2016   nose    There were no vitals filed for this visit.    Subjective Assessment - 05/06/21 1405     Subjective Patient reports that she has Lt neck pain at the base of the ear and the muscles into the ear. She has had pain for the past 3 months with symptoms worse at night. She has started a muscle relaxant and purchased a new pillow in the past two days with some improvement.    Pertinent History abdominal surgery 02/16/21; arthritis; osteoporesis; anxiety; shoulder pain    Patient Stated Goals get rid of the neck pain and turn her head side to side with no pain    Currently in Pain? Yes    Pain Score 8     Pain Location Neck    Pain Orientation Left;Posterior;Upper    Pain Descriptors / Indicators Nagging    Pain Type Acute pain    Pain Radiating Towards ear to shoulder Lt    Pain Onset More than a month ago    Pain Frequency Intermittent    Aggravating  Factors  turning head to Lt; pulling with turning to Rt; worse at night    Pain Relieving Factors meds; pillow; heat                [facility] PT Assessment - 05/06/21 0001       Assessment   Medical Diagnosis Lt cervical dysfunction    Referring Provider (PT) Iran Planas, PA-C    Onset Date/Surgical Date 01/24/21    Hand Dominance Right    Next MD Visit 06/01/21    Prior Therapy treatment for Lt shoulder and LBP here      Precautions   Precautions None      Restrictions   Weight Bearing Restrictions No      Balance Screen   Has the patient fallen in the past 6 months No    Has the patient had a decrease in activity level because of a fear of falling?  No    Is the patient reluctant to leave their home because of a fear of falling?  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    Leisure piano; out with sister ~ weekly; shopping; TV/movies sits in a recliner      Observation/Other Assessments   Focus on Therapeutic Outcomes (FOTO)  43      Sensation   Additional Comments WFL's per pt report      Posture/Postural Control   Posture Comments head forward; shoulders rounded and elevated; head of the humerus anterior in orientation; increased thoracic kyphosis      AROM   Overall AROM Comments pain with all cervical movements less with floward flexion pain prib=marily in Lt cervical area    Right/Left Shoulder --   WFL's - tight at end range   Cervical Flexion 43    Cervical Extension 26    Cervical - Right Side Bend 18    Cervical - Left Side Bend 19    Cervical - Right Rotation 39    Cervical - Left Rotation 40      Strength   Overall Strength Comments WFL's for functional tasks bilat UE's      Palpation   Spinal  mobility hypomobile thoracic and cervical spine with PA and lateral mobs    Palpation comment muscular tightness Lt > Rt ant/lat/posterior cervical musculature; pecs; upper traps; thoracic paraspinals                        Objective measurements completed on examination: See above findings.       [facility] Adult PT Treatment/Exercise - 05/06/21 0001       Self-Care   Self-Care Posture    Posture sitting posture and alignment; modifying chair for home      Shoulder Exercises: Standing   Other Standing Exercises axial extension 5 sec x 5 reps; scap squeeze 5 sec x 5 reps; L's x 10; W's x 10 with noodle along spine      Shoulder Exercises: Stretch   Other Shoulder Stretches doorway stretch x 3 positions  (all three slightly lower) 30 sec x 2 reps      Moist Heat Therapy   Number Minutes Moist Heat 10 Minutes    Moist Heat Location Cervical      Manual Therapy   Manual therapy comments pt sitting    Soft tissue mobilization soft tissue work through the cervical musculature                    PT Education - 05/06/21 1436     Education Details HEP POC posture and alignment; TENS    Person(s) Educated Patient    Methods Explanation;Demonstration;Tactile cues;Verbal cues;Handout    Comprehension Verbalized understanding;Returned demonstration;Verbal cues required;Tactile cues required                 PT Long Term Goals - 05/06/21 1558       PT LONG TERM GOAL #1   Title Improve posture and alignment with patient ot demonstrated improved upright posture with postreior shoulder girdle engaged    Baseline -    Time 6    Period Weeks    Status New    Target Date 06/17/21      PT LONG TERM GOAL #2   Title Decrease cervical pain by 75-100% allowing patient to return to all normal functional activities including  sleep without pain or limitations    Time 6    Period Weeks    Status New    Target Date 06/17/21      PT LONG TERM GOAL #3   Title  Increase cervical rotation by 3-5 deg and lateral flexion by 5-6 deg with all cervical ROM painfree    Baseline -    Time 6    Period Weeks    Status New    Target Date 06/17/21      PT LONG TERM GOAL #4   Title Independent in HEP    Baseline -    Time 6    Period Weeks    Status New    Target Date 06/17/21      PT LONG TERM GOAL #5   Title mprove functional limitatiion score to 60    Baseline -    Time 6    Period Weeks    Status New    Target Date 06/17/21                    Plan - 05/06/21 1554     Clinical Impression Statement Patient presents with Lt cervical pain and tightness present for the past 4 months with no known injury. She has poor posture and alignment; limited cervial and thoracic mobility; muscular tightness to palpation; poor positioning at home especially sitting. She will benefit from PT to address problems identified.    Stability/Clinical Decision Making Stable/Uncomplicated    Clinical Decision Making Low    Rehab Potential Good    PT Frequency 2x / week    PT Duration 6 weeks    PT Treatment/Interventions ADLs/Self Care Home Management;Aquatic Therapy;Cryotherapy;Electrical Stimulation;Iontophoresis '4mg'$ /ml Dexamethasone;Moist Heat;Ultrasound;Therapeutic activities;Therapeutic exercise;Balance training;Neuromuscular re-education;Patient/family education;Manual techniques;Dry needling;Taping    PT Next Visit Plan review HEP; porgress with posterior shoulder girdle strengthening and cervical mobility; manual work through anterior chest/pecs; upper trap; ant/lat/posterior cervical musculature; modalities as indicated; possible trial of TENS for home management of symptoms    PT Home Exercise Plan [email or web address]    Consulted and Agree with Plan of Care Patient             Patient will benefit from skilled therapeutic intervention in order to improve the following deficits and impairments:  Decreased range of motion, Pain, Decreased activity  tolerance, Hypomobility, Impaired flexibility, Improper body mechanics, Other (comment), Decreased mobility, Postural dysfunction  Visit Diagnosis: Cervicalgia  Other symptoms and signs involving the musculoskeletal system  Abnormal posture  Stiffness of cervical spine     Problem List Patient Active Problem List   Diagnosis Date Noted   DDD (degenerative disc disease), cervical 05/04/2021   Neck pain 05/03/2021   Anorexia [id number]   Umbilical hernia without obstruction and without gangrene 06/30/2020   Meralgia paresthetica of right side 06/22/2020   Venous insufficiency of both lower extremities 06/22/2020   Primary osteoarthritis, left shoulder 06/03/2020   Bilateral lower extremity edema 05/25/2020   Aortic atherosclerosis ([place]) 05/13/2020   Bunion, right foot 0000000   Lichen planus 0000000   Asthma in adult 11/22/2019   Mixed restrictive and obstructive lung disease ([place]) 11/22/2019   Primary osteoarthritis involving multiple joints 11/22/2019   Acute pain of left shoulder 09/05/2019   Chronic bilateral low back pain without sciatica 09/05/2019   OAB (overactive bladder) 08/08/2019   Insomnia 08/08/2019   History of melanoma 07/20/2017   History of basal cell carcinoma 07/20/2017   IFG (impaired fasting glucose) 07/17/2017   Macular  degeneration    Chronic fatigue 08/06/2015   Gastroesophageal reflux disease without esophagitis 08/06/2015   Glaucoma 08/06/2015   Hiatal hernia 08/06/2015   Melanoma of right upper arm ([place]) 08/06/2015   Mixed hyperlipidemia 08/06/2015   Rupture of right biceps tendon 08/06/2015   Biceps tendonitis of both shoulders 08/06/2015    Loan Oguin Nilda Simmer PT, MPH  05/06/2021, 4:02 PM  [facility] [place], [place], [address] Phone: [phone]   Fax:  [phone]  Name: Ashleynicole Sylvia MRN: [id number] Date of Birth: [birth year]

## 2021-05-06 NOTE — Patient Instructions (Signed)
Access Code: [email or web address] URL: [email or web address] Date: 05/06/2021 Prepared by: Gillermo Murdoch  Exercises Seated Cervical Retraction - 3 x daily - 7 x weekly - 10 reps - 1 sets Standing Scapular Retraction - 3 x daily - 7 x weekly - 10 reps - 1 sets - 10 hold Shoulder External Rotation and Scapular Retraction - 3 x daily - 7 x weekly - 1 sets - 10 reps - 3 hold Shoulder External Rotation in 45 Degrees Abduction - 2 x daily - 7 x weekly - 1-2 sets - 10 reps - 3 sec hold Doorway Pec Stretch at 60 Degrees Abduction - 3 x daily - 7 x weekly - 3 reps - 1 sets Doorway Pec Stretch at 90 Degrees Abduction - 3 x daily - 7 x weekly - 3 reps - 1 sets - 30 seconds hold Doorway Pec Stretch at 120 Degrees Abduction - 3 x daily - 7 x weekly - 3 reps - 1 sets - 30 second hold hold  Patient Education Posture and Body Mechanics Office Posture TENS Unit

## 2021-05-10 ENCOUNTER — Encounter: Payer: Self-pay | Admitting: Rehabilitative and Restorative Service Providers"

## 2021-05-10 ENCOUNTER — Ambulatory Visit: Payer: Medicare Other | Admitting: Rehabilitative and Restorative Service Providers"

## 2021-05-10 ENCOUNTER — Other Ambulatory Visit: Payer: Self-pay

## 2021-05-10 DIAGNOSIS — R293 Abnormal posture: Secondary | ICD-10-CM | POA: Diagnosis not present

## 2021-05-10 DIAGNOSIS — M542 Cervicalgia: Secondary | ICD-10-CM | POA: Diagnosis not present

## 2021-05-10 DIAGNOSIS — M436 Torticollis: Secondary | ICD-10-CM | POA: Diagnosis not present

## 2021-05-10 DIAGNOSIS — R29898 Other symptoms and signs involving the musculoskeletal system: Secondary | ICD-10-CM | POA: Diagnosis not present

## 2021-05-10 NOTE — Therapy (Signed)
[place], [place], [address] Phone: [phone]   Fax:  [phone]  Physical Therapy Treatment  Patient Details  Name: Anna [name] MRN: [id number] Date of Birth: Jun 29, 1936 Referring Provider (PT): Iran Planas, [place]   Encounter Date: 05/10/2021   PT End of Session - 05/10/21 1149     Visit Number 2    Number of Visits 12    Date for PT Re-Evaluation 06/17/21    PT Start Time 1146    PT Stop Time [id number]    PT Time Calculation (min) 49 min    Activity Tolerance Patient tolerated treatment well             Past Medical History:  Diagnosis Date   Dermatitis    seborrehic scalp   Hiatal hernia    History of basal cell carcinoma 07/20/2017   On nose.    History of melanoma 07/20/2017   Right upper arm    Hyperlipidemia    Macular degeneration     Past Surgical History:  Procedure Laterality Date   BREAST CYST EXCISION Right 09/1962   CATARACT EXTRACTION  03/2015   melonoma Right 06/2016   arm   MOHS SURGERY Left 06/2016   nose    There were no vitals filed for this visit.   Subjective Assessment - 05/10/21 1150     Subjective Improving - less pain. She is using the noodle for sitting in her chair and it feels good. Loves using the noodle. Has been working on the exercises at home. She was able to do chair yoga Friday afternoon. Neck is a lot better - still some pain in the ear at times.    Currently in Pain? Yes    Pain Score 5     Pain Location Neck    Pain Orientation Left;Posterior;Upper    Pain Descriptors / Indicators Nagging    Pain Type Acute pain    Pain Onset More than a month ago    Pain Frequency Intermittent                [facility] PT Assessment - 05/10/21 0001       Assessment   Medical Diagnosis Lt cervical dysfunction    Referring Provider (PT) Iran Planas, PA-C    Onset Date/Surgical Date 01/24/21    Hand Dominance Right    Next MD Visit  06/01/21    Prior Therapy treatment for Lt shoulder and LBP here      Palpation   Palpation comment continued muscular tightness Lt > Rt ant/lat/posterior cervical musculature; pecs; upper traps; thoracic paraspinals                           [facility] Adult PT Treatment/Exercise - 05/10/21 0001       Neck Exercises: Supine   Neck Retraction 5 reps;5 secs;10 secs    Other Supine Exercise nodding yes/nodding no      Shoulder Exercises: Standing   Other Standing Exercises axial extension 5 sec x 5 reps; scap squeeze 5 sec x 5 reps; L's x 10; W's x 10 with noodle along spine      Shoulder Exercises: Stretch   Other Shoulder Stretches doorway stretch x 3 positions  (all three slightly lower) 30 sec x 2 reps      Moist Heat Therapy   Number Minutes Moist Heat 10 Minutes    Moist Heat Location Cervical  Health and safety inspector Parameters to toerance    Electrical Stimulation Goals Pain;Tone      Manual Therapy   Manual therapy comments sp supine    Joint Mobilization cervical PA and lateral glides    Soft tissue mobilization ant/lat/posterior cervical musculature; upper trap; leveator Lt > Rt    Myofascial Release pecs/anterior chest    Manual Traction cervical traction 10-15 sec hold x 5 reps                         PT Long Term Goals - 05/06/21 1558       PT LONG TERM GOAL #1   Title Improve posture and alignment with patient ot demonstrated improved upright posture with postreior shoulder girdle engaged    Baseline -    Time 6    Period Weeks    Status New    Target Date 06/17/21      PT LONG TERM GOAL #2   Title Decrease cervical pain by 75-100% allowing patient to return to all normal functional activities including sleep without pain or limitations    Time 6    Period Weeks    Status New    Target Date 06/17/21       PT LONG TERM GOAL #3   Title Increase cervical rotation by 3-5 deg and lateral flexion by 5-6 deg with all cervical ROM painfree    Baseline -    Time 6    Period Weeks    Status New    Target Date 06/17/21      PT LONG TERM GOAL #4   Title Independent in HEP    Baseline -    Time 6    Period Weeks    Status New    Target Date 06/17/21      PT LONG TERM GOAL #5   Title mprove functional limitatiion score to 60    Baseline -    Time 6    Period Weeks    Status New    Target Date 06/17/21                   Plan - 05/10/21 1155     PT Treatment/Interventions --    PT Home Exercise Plan --             Patient will benefit from skilled therapeutic intervention in order to improve the following deficits and impairments:     Visit Diagnosis: Cervicalgia  Other symptoms and signs involving the musculoskeletal system  Abnormal posture  Stiffness of cervical spine     Problem List Patient Active Problem List   Diagnosis Date Noted   DDD (degenerative disc disease), cervical 05/04/2021   Neck pain 05/03/2021   Anorexia 99991111   Umbilical hernia without obstruction and without gangrene 06/30/2020   Meralgia paresthetica of right side 06/22/2020   Venous insufficiency of both lower extremities 06/22/2020   Primary osteoarthritis, left shoulder 06/03/2020   Bilateral lower extremity edema 05/25/2020   Aortic atherosclerosis ([place]) 05/13/2020   Bunion, right foot 0000000   Lichen planus 0000000   Asthma in adult 11/22/2019   Mixed restrictive and obstructive lung disease ([place]) 11/22/2019   Primary osteoarthritis involving multiple joints 11/22/2019   Acute pain of left shoulder 09/05/2019   Chronic bilateral low back pain without sciatica 09/05/2019   OAB (overactive  bladder) 08/08/2019   Insomnia 08/08/2019   History of melanoma 07/20/2017   History of basal cell carcinoma 07/20/2017   IFG (impaired fasting glucose) 07/17/2017    Macular degeneration    Chronic fatigue 08/06/2015   Gastroesophageal reflux disease without esophagitis 08/06/2015   Glaucoma 08/06/2015   Hiatal hernia 08/06/2015   Melanoma of right upper arm ([place]) 08/06/2015   Mixed hyperlipidemia 08/06/2015   Rupture of right biceps tendon 08/06/2015   Biceps tendonitis of both shoulders 08/06/2015    Osei Anger Nilda Simmer PT, MPH  05/10/2021, 12:30 PM  [facility] [place], [place], [address] Phone: [phone]   Fax:  [phone]  Name: Anna [name] MRN: [id number] Date of Birth: July 26, 1936

## 2021-05-12 ENCOUNTER — Ambulatory Visit: Payer: Medicare Other | Admitting: Rehabilitative and Restorative Service Providers"

## 2021-05-12 ENCOUNTER — Encounter: Payer: Self-pay | Admitting: Rehabilitative and Restorative Service Providers"

## 2021-05-12 ENCOUNTER — Other Ambulatory Visit: Payer: Self-pay

## 2021-05-12 DIAGNOSIS — R29898 Other symptoms and signs involving the musculoskeletal system: Secondary | ICD-10-CM | POA: Diagnosis not present

## 2021-05-12 DIAGNOSIS — R293 Abnormal posture: Secondary | ICD-10-CM

## 2021-05-12 DIAGNOSIS — M436 Torticollis: Secondary | ICD-10-CM

## 2021-05-12 DIAGNOSIS — M542 Cervicalgia: Secondary | ICD-10-CM

## 2021-05-12 NOTE — Therapy (Signed)
[facility] [place], [place], [address] Phone: [phone]   Fax:  [phone]  Physical Therapy Treatment  Patient Details  Name: Anna [name] MRN: [id number] Date of Birth: 1936/09/09 Referring Provider (PT): Iran Planas, [place]   Encounter Date: 05/12/2021   PT End of Session - 05/12/21 1345     Visit Number 3    Number of Visits 12    Date for PT Re-Evaluation 06/17/21    PT Start Time 1344    PT Stop Time 1432    PT Time Calculation (min) 48 min    Activity Tolerance Patient tolerated treatment well             Past Medical History:  Diagnosis Date   Dermatitis    seborrehic scalp   Hiatal hernia    History of basal cell carcinoma 07/20/2017   On nose.    History of melanoma 07/20/2017   Right upper arm    Hyperlipidemia    Macular degeneration     Past Surgical History:  Procedure Laterality Date   BREAST CYST EXCISION Right 09/1962   CATARACT EXTRACTION  03/2015   melonoma Right 06/2016   arm   MOHS SURGERY Left 06/2016   nose    There were no vitals filed for this visit.   Subjective Assessment - 05/12/21 1345     Subjective Continued improvement with less pain every day, especially at night. She is sleeping better. Can turn head to check traffic with greater ease when she is walking. Working on exercises at home. TENS unit works well but she is not going to order one for home.    Currently in Pain? Yes    Pain Score 2     Pain Location Neck    Pain Orientation Left;Posterior;Upper    Pain Descriptors / Indicators Nagging                               [facility] Adult PT Treatment/Exercise - 05/12/21 0001       Neck Exercises: Supine   Neck Retraction 5 reps;5 secs;10 secs    Lateral Flexion Limitations to Rt 10 sec x 5 reps sitting    Other Supine Exercise nodding yes/nodding no      Shoulder Exercises: Standing   Extension Strengthening;Both;10  reps;Theraband    Theraband Level (Shoulder Extension) Level 2 (Red)    Row Strengthening;Both;10 reps;Theraband    Theraband Level (Shoulder Row) Level 2 (Red)    Retraction Strengthening;Both;10 reps;Theraband    Theraband Level (Shoulder Retraction) Level 1 (Yellow)    Other Standing Exercises axial extension 5 sec x 5 reps; scap squeeze 5 sec x 5 reps; L's x 10; W's x 10 with noodle along spine      Shoulder Exercises: Stretch   Other Shoulder Stretches doorway stretch x 3 positions  (all three slightly lower) 30 sec x 2 reps      Moist Heat Therapy   Number Minutes Moist Heat 10 Minutes    Moist Heat Location Cervical      Electrical Stimulation   Electrical Stimulation Location Lt cervical/shoulder    Electrical Stimulation Action TENS    Electrical Stimulation Parameters to tolerance    Electrical Stimulation Goals Pain;Tone      Manual Therapy   Manual therapy comments sp supine    Joint Mobilization cervical PA and lateral glides    Soft tissue mobilization  ant/lat/posterior cervical musculature; upper trap; leveator Lt > Rt    Myofascial Release pecs/anterior chest    Passive ROM cervical flexion; Rt lateral flexion 10-15 sec hold x 2 each    Manual Traction cervical traction 10-15 sec hold x 5 reps                    PT Education - 05/12/21 1357     Education Details HEP DN    Person(s) Educated Patient    Methods Explanation;Demonstration;Tactile cues;Verbal cues;Handout    Comprehension Verbalized understanding;Returned demonstration;Verbal cues required;Tactile cues required                 PT Long Term Goals - 05/06/21 1558       PT LONG TERM GOAL #1   Title Improve posture and alignment with patient ot demonstrated improved upright posture with postreior shoulder girdle engaged    Baseline -    Time 6    Period Weeks    Status New    Target Date 06/17/21      PT LONG TERM GOAL #2   Title Decrease cervical pain by 75-100% allowing  patient to return to all normal functional activities including sleep without pain or limitations    Time 6    Period Weeks    Status New    Target Date 06/17/21      PT LONG TERM GOAL #3   Title Increase cervical rotation by 3-5 deg and lateral flexion by 5-6 deg with all cervical ROM painfree    Baseline -    Time 6    Period Weeks    Status New    Target Date 06/17/21      PT LONG TERM GOAL #4   Title Independent in HEP    Baseline -    Time 6    Period Weeks    Status New    Target Date 06/17/21      PT LONG TERM GOAL #5   Title mprove functional limitatiion score to 60    Baseline -    Time 6    Period Weeks    Status New    Target Date 06/17/21                   Plan - 05/12/21 1351     Clinical Impression Statement Continued progress with decreasing pain and improving functional activity evel. Patient added resistive postural strengthening exercises without difficulty.    Rehab Potential Good    PT Frequency 2x / week    PT Duration 6 weeks    PT Treatment/Interventions ADLs/Self Care Home Management;Aquatic Therapy;Cryotherapy;Electrical Stimulation;Iontophoresis '4mg'$ /ml Dexamethasone;Moist Heat;Ultrasound;Therapeutic activities;Therapeutic exercise;Balance training;Neuromuscular re-education;Patient/family education;Manual techniques;Dry needling;Taping    PT Next Visit Plan review HEP; porgress with posterior shoulder girdle strengthening and cervical mobility; manual work through anterior chest/pecs; upper trap; ant/lat/posterior cervical musculature; modalities as indicated    PT Home Exercise Plan [email or web address]    Consulted and Agree with Plan of Care Patient             Patient will benefit from skilled therapeutic intervention in order to improve the following deficits and impairments:     Visit Diagnosis: Cervicalgia  Other symptoms and signs involving the musculoskeletal system  Abnormal posture  Stiffness of cervical  spine     Problem List Patient Active Problem List   Diagnosis Date Noted   DDD (degenerative disc disease), cervical 05/04/2021   Neck pain 05/03/2021  Anorexia 99991111   Umbilical hernia without obstruction and without gangrene 06/30/2020   Meralgia paresthetica of right side 06/22/2020   Venous insufficiency of both lower extremities 06/22/2020   Primary osteoarthritis, left shoulder 06/03/2020   Bilateral lower extremity edema 05/25/2020   Aortic atherosclerosis ([place]) 05/13/2020   Bunion, right foot 0000000   Lichen planus 0000000   Asthma in adult 11/22/2019   Mixed restrictive and obstructive lung disease ([place]) 11/22/2019   Primary osteoarthritis involving multiple joints 11/22/2019   Acute pain of left shoulder 09/05/2019   Chronic bilateral low back pain without sciatica 09/05/2019   OAB (overactive bladder) 08/08/2019   Insomnia 08/08/2019   History of melanoma 07/20/2017   History of basal cell carcinoma 07/20/2017   IFG (impaired fasting glucose) 07/17/2017   Macular degeneration    Chronic fatigue 08/06/2015   Gastroesophageal reflux disease without esophagitis 08/06/2015   Glaucoma 08/06/2015   Hiatal hernia 08/06/2015   Melanoma of right upper arm ([place]) 08/06/2015   Mixed hyperlipidemia 08/06/2015   Rupture of right biceps tendon 08/06/2015   Biceps tendonitis of both shoulders 08/06/2015    Katricia Prehn Nilda Simmer PT, MPH  05/12/2021, 2:29 PM  [facility] [place], [place], [address] Phone: [phone]   Fax:  [phone]  Name: Anna [name] MRN: [id number] Date of Birth: 07-Apr-1936

## 2021-05-12 NOTE — Patient Instructions (Addendum)
  Access Code: [email or web address] URL: [email or web address] Date: 05/12/2021 Prepared by: Gillermo Murdoch  Exercises Seated Cervical Retraction - 3 x daily - 7 x weekly - 10 reps - 1 sets Standing Scapular Retraction - 3 x daily - 7 x weekly - 10 reps - 1 sets - 10 hold Shoulder External Rotation and Scapular Retraction - 3 x daily - 7 x weekly - 1 sets - 10 reps - 3 hold Shoulder External Rotation in 45 Degrees Abduction - 2 x daily - 7 x weekly - 1-2 sets - 10 reps - 3 sec hold Doorway Pec Stretch at 60 Degrees Abduction - 3 x daily - 7 x weekly - 3 reps - 1 sets Doorway Pec Stretch at 90 Degrees Abduction - 3 x daily - 7 x weekly - 3 reps - 1 sets - 30 seconds hold Doorway Pec Stretch at 120 Degrees Abduction - 3 x daily - 7 x weekly - 3 reps - 1 sets - 30 second hold hold Supine Cervical Retraction with Towel - 2 x daily - 7 x weekly - 1 sets - 5-10 reps - 10 sec hold Shoulder External Rotation and Scapular Retraction with Resistance - 2 x daily - 7 x weekly - 10 reps - 3 sets Scapular Retraction with Resistance - 2 x daily - 7 x weekly - 10 reps - 3 sets Scapular Retraction with Resistance Advanced - 2 x daily - 7 x weekly - 10 reps - 3 sets  Trigger Point Dry Needling  What is Trigger Point Dry Needling (DN)? DN is a physical therapy technique used to treat muscle pain and dysfunction. Specifically, DN helps deactivate muscle trigger points (muscle knots).  A thin filiform needle is used to penetrate the skin and stimulate the underlying trigger point. The goal is for a local twitch response (LTR) to occur and for the trigger point to relax. No medication of any kind is injected during the procedure.   What Does Trigger Point Dry Needling Feel Like?  The procedure feels different for each individual patient. Some patients report that they do not actually feel the needle enter the skin and overall the process is not painful. Very mild bleeding may occur. However, many patients feel a  deep cramping in the muscle in which the needle was inserted. This is the local twitch response.   How Will I feel after the treatment? Soreness is normal, and the onset of soreness may not occur for a few hours. Typically this soreness does not last longer than two days.  Bruising is uncommon, however; ice can be used to decrease any possible bruising.  In rare cases feeling tired or nauseous after the treatment is normal. In addition, your symptoms may get worse before they get better, this period will typically not last longer than 24 hours.   What Can I do After My Treatment? Increase your hydration by drinking more water for the next 24 hours. You may place ice or heat on the areas treated that have become sore, however, do not use heat on inflamed or bruised areas. Heat often brings more relief post needling. You can continue your regular activities, but vigorous activity is not recommended initially after the treatment for 24 hours. DN is best combined with other physical therapy such as strengthening, stretching, and other therapies.

## 2021-05-13 ENCOUNTER — Encounter: Payer: Medicare Other | Admitting: Rehabilitative and Restorative Service Providers"

## 2021-05-16 ENCOUNTER — Other Ambulatory Visit: Payer: Self-pay | Admitting: Family Medicine

## 2021-05-16 DIAGNOSIS — J4540 Moderate persistent asthma, uncomplicated: Secondary | ICD-10-CM

## 2021-05-19 ENCOUNTER — Encounter: Payer: Medicare Other | Admitting: Rehabilitative and Restorative Service Providers"

## 2021-05-23 ENCOUNTER — Encounter: Payer: Self-pay | Admitting: Emergency Medicine

## 2021-05-23 ENCOUNTER — Emergency Department
Admission: EM | Admit: 2021-05-23 | Discharge: 2021-05-23 | Disposition: A | Discharge status: Home / Self Care | Payer: Medicare Other | Source: Home / Self Care

## 2021-05-23 ENCOUNTER — Other Ambulatory Visit: Payer: Self-pay

## 2021-05-23 DIAGNOSIS — R300 Dysuria: Secondary | ICD-10-CM

## 2021-05-23 DIAGNOSIS — N3090 Cystitis, unspecified without hematuria: Secondary | ICD-10-CM | POA: Diagnosis not present

## 2021-05-23 DIAGNOSIS — N3001 Acute cystitis with hematuria: Secondary | ICD-10-CM | POA: Diagnosis not present

## 2021-05-23 LAB — POCT URINALYSIS DIP (MANUAL ENTRY)
Bilirubin, UA: NEGATIVE
Blood, UA: NEGATIVE
Glucose, UA: NEGATIVE mg/dL
Nitrite, UA: NEGATIVE
Protein Ur, POC: NEGATIVE mg/dL
Spec Grav, UA: 1.025 (ref 1.010–1.025)
Urobilinogen, UA: 0.2 E.U./dL
pH, UA: 6 (ref 5.0–8.0)

## 2021-05-23 MED ORDER — SULFAMETHOXAZOLE-TRIMETHOPRIM 800-160 MG PO TABS: 1.0000 | ORAL_TABLET | Freq: Two times a day (BID) | ORAL | 0 refills | Status: AC

## 2021-05-23 NOTE — Discharge Instructions (Addendum)
Advised patient to take medication as directed with food to completion.  Encouraged patient to increase daily water intake while taking this medication.  Advised patient we will follow-up with her with urine culture results once received.

## 2021-05-23 NOTE — ED Provider Notes (Signed)
[facility]    CSN: [id number] Arrival date & time: 05/23/21  1109      History   Chief Complaint Chief Complaint  Patient presents with   Dysuria    HPI Anna [name] is a 85 y.o. female.   HPI 85 year old female presents with dysuria for 2 days.  PMH significant for OAB.  Past Medical History:  Diagnosis Date   Dermatitis    seborrehic scalp   Hiatal hernia    History of basal cell carcinoma 07/20/2017   On nose.    History of melanoma 07/20/2017   Right upper arm    Hyperlipidemia    Macular degeneration     Patient Active Problem List   Diagnosis Date Noted   DDD (degenerative disc disease), cervical 05/04/2021   Neck pain 05/03/2021   Anorexia 99991111   Umbilical hernia without obstruction and without gangrene 06/30/2020   Meralgia paresthetica of right side 06/22/2020   Venous insufficiency of both lower extremities 06/22/2020   Primary osteoarthritis, left shoulder 06/03/2020   Bilateral lower extremity edema 05/25/2020   Aortic atherosclerosis ([place]) 05/13/2020   Bunion, right foot 0000000   Lichen planus 0000000   Asthma in adult 11/22/2019   Mixed restrictive and obstructive lung disease ([place]) 11/22/2019   Primary osteoarthritis involving multiple joints 11/22/2019   Acute pain of left shoulder 09/05/2019   Chronic bilateral low back pain without sciatica 09/05/2019   OAB (overactive bladder) 08/08/2019   Insomnia 08/08/2019   History of melanoma 07/20/2017   History of basal cell carcinoma 07/20/2017   IFG (impaired fasting glucose) 07/17/2017   Macular degeneration    Chronic fatigue 08/06/2015   Gastroesophageal reflux disease without esophagitis 08/06/2015   Glaucoma 08/06/2015   Hiatal hernia 08/06/2015   Melanoma of right upper arm ([place]) 08/06/2015   Mixed hyperlipidemia 08/06/2015   Rupture of right biceps tendon 08/06/2015   Biceps tendonitis of both shoulders 08/06/2015    Past Surgical History:  Procedure  Laterality Date   BREAST CYST EXCISION Right 09/1962   CATARACT EXTRACTION  03/2015   melonoma Right 06/2016   arm   MOHS SURGERY Left 06/2016   nose    OB History   No obstetric history on file.      Home Medications    Prior to Admission medications   Medication Sig Start Date End Date Taking? Authorizing Provider  ALPRAZolam Duanne Moron) 0.25 MG tablet TAKE ONE TABLET (0.'25MG'$  TOTAL) BY MOUTH AT BEDTIME AS NEEDED FOR SLEEP 04/06/21  Yes Hali Marry, MD  AMBULATORY NON FORMULARY MEDICATION 20-30 mmHg Knee High Compression Stockings. 05/25/20  Yes Early, Coralee Pesa, NP  ammonium lactate (AMLACTIN) 12 % cream Apply topically as needed for dry skin.   Yes [provider]  baclofen (LIORESAL) 10 MG tablet Take 1 tablet (10 mg total) by mouth at bedtime. 05/03/21  Yes Breeback, Jade L, PA-C  Bevacizumab 2.75 MG/0.11ML SOSY Inject into the eye.   Yes [provider]  calcium citrate-vitamin D (CITRACAL+D) 315-200 MG-UNIT tablet Take 1 tablet by mouth 2 (two) times daily.   Yes [provider]  celecoxib (CELEBREX) 200 MG capsule TAKE ONE CAPSULE BY MOUTH ONCE A DAY 07/06/20  Yes Hali Marry, MD  COD LIVER OIL PO Take 1 mL by mouth daily.   Yes [provider]  Flaxseed Oil OIL 1,000 mg.   Yes [provider]  fluticasone-salmeterol (ADVAIR) 250-50 MCG/ACT AEPB Inhale 1 puff into the lungs 2 (two) times  daily. Needs appointment. 05/18/21  Yes Hali Marry, MD  furosemide (LASIX) 20 MG tablet Take 1 tablet (20 mg total) by mouth as needed for edema. You may take 1 tablet up to once every day for leg swelling not improved with stockings. 09/10/20  Yes Early, Coralee Pesa, NP  ketoconazole (NIZORAL) 2 % shampoo USE THREE TIMES A WEEK AS DIRECTED 11/12/19  Yes [provider]  latanoprost (XALATAN) 0.005 % ophthalmic solution Place 1 drop into both eyes daily. 05/02/16  Yes [provider]  lovastatin (MEVACOR) 40 MG tablet  TAKE ONE TABLET BY MOUTH NIGHTLY/ labs for further refills 03/25/21  Yes Hali Marry, MD  meclizine (ANTIVERT) 25 MG tablet Take 1 tablet (25 mg total) by mouth 3 (three) times daily as needed for dizziness. 08/21/19  Yes Hali Marry, MD  meloxicam (MOBIC) 7.5 MG tablet Take 1 tablet (7.5 mg total) by mouth daily. In the morning. 05/03/21  Yes Breeback, Jade L, PA-C  Multiple Vitamins-Minerals (PRESERVISION AREDS 2) CAPS Take by mouth.   Yes [provider]  MYRBETRIQ 25 MG TB24 tablet Take 1 tablet (25 mg total) by mouth daily. 07/29/20  Yes Hali Marry, MD  omeprazole (PRILOSEC) 40 MG capsule Take 1 capsule (40 mg total) by mouth daily. 01/11/21  Yes Hali Marry, MD  sucralfate (CARAFATE) 1 g tablet TAKE 1 TABLET BY MOUTH  TWICE DAILY 07/10/20  Yes Hali Marry, MD  sulfamethoxazole-trimethoprim (BACTRIM DS) 800-160 MG tablet Take 1 tablet by mouth 2 (two) times daily for 3 days. 05/23/21 05/26/21 Yes Eliezer Lofts, FNP  timolol (BETIMOL) 0.5 % ophthalmic solution Place 1 drop into the left eye 2 (two) times daily.   Yes [provider]  timolol (TIMOPTIC) 0.5 % ophthalmic solution  11/19/19  Yes [provider]  triamcinolone (KENALOG) 0.1 % Apply 1 application topically 2 (two) times daily as needed. Do not use for more than 2 weeks at a time 12/11/20  Yes Metheney, Rene Kocher, MD  TURMERIC PO Take 1,000 mg by mouth.   Yes [provider]  UNABLE TO FIND Right eye injection every 5-6 weeks for Macular Degeneration   Yes [provider]  vitamin B-12 (CYANOCOBALAMIN) 1000 MCG tablet Take 1,000 mcg by mouth daily.    Yes [provider]    Family History Family History  Problem Relation Age of Onset   Hyperlipidemia Sister    Macular degeneration Sister    Cancer - Prostate Brother    Atrial fibrillation Maternal Aunt    Cancer Mother        pancreatic   Cancer Father        liver    Social  History Social History   Tobacco Use   Smoking status: Never   Smokeless tobacco: Never  Vaping Use   Vaping Use: Never used  Substance Use Topics   Alcohol use: Yes    Alcohol/week: 1.0 standard drink    Types: 1 Glasses of wine per week    Comment: occasionally   Drug use: No     Allergies   Aleve [naproxen sodium] and Azithromycin   Review of Systems Review of Systems  Genitourinary:  Positive for dysuria.  All other systems reviewed and are negative.   Physical Exam Triage Vital Signs ED Triage Vitals  Enc Vitals Group     BP 05/23/21 1139 133/85     Pulse Rate 05/23/21 1139 67     Resp 05/23/21 1139  16     Temp 05/23/21 1139 98.4 F (36.9 C)     Temp Source 05/23/21 1139 Oral     SpO2 05/23/21 1139 96 %     Weight --      Height --      Head Circumference --      Peak Flow --      Pain Score 05/23/21 1136 10     Pain Loc --      Pain Edu? --      Excl. in [place]? --    No data found.  Updated Vital Signs BP 133/85 (BP Location: Right Arm)   Pulse 67   Temp 98.4 F (36.9 C) (Oral)   Resp 16   SpO2 96%       Physical Exam Vitals and nursing note reviewed.  Constitutional:      General: She is not in acute distress.    Appearance: Normal appearance. She is obese. She is not ill-appearing.  HENT:     Head: Normocephalic and atraumatic.     Mouth/Throat:     Mouth: Mucous membranes are moist.     Pharynx: Oropharynx is clear.  Eyes:     Extraocular Movements: Extraocular movements intact.     Conjunctiva/sclera: Conjunctivae normal.     Pupils: Pupils are equal, round, and reactive to light.  Cardiovascular:     Rate and Rhythm: Normal rate and regular rhythm.     Pulses: Normal pulses.     Heart sounds: Normal heart sounds.  Pulmonary:     Effort: Pulmonary effort is normal.     Breath sounds: Normal breath sounds.     Comments: No adventitious breath sounds noted Abdominal:     Tenderness: There is no right CVA tenderness or left CVA  tenderness.  Musculoskeletal:        General: Normal range of motion.     Cervical back: Normal range of motion and neck supple. No tenderness.  Lymphadenopathy:     Cervical: No cervical adenopathy.  Skin:    General: Skin is warm and dry.  Neurological:     General: No focal deficit present.     Mental Status: She is alert and oriented to person, place, and time. Mental status is at baseline.  Psychiatric:        Mood and Affect: Mood normal.        Behavior: Behavior normal.        Thought Content: Thought content normal.     UC Treatments / Results  Labs (all labs ordered are listed, but only abnormal results are displayed) Labs Reviewed  POCT URINALYSIS DIP (MANUAL ENTRY) - Abnormal; Notable for the following components:      Result Value   Ketones, POC UA trace (5) (*)    Leukocytes, UA Small (1+) (*)    All other components within normal limits  URINE CULTURE    EKG   Radiology No results found.  Procedures Procedures (including critical care time)  Medications Ordered in UC Medications - No data to display  Initial Impression / Assessment and Plan / UC Course  I have reviewed the triage vital signs and the nursing notes.  Pertinent labs & imaging results that were available during my care of the patient were reviewed by me and considered in my medical decision making (see chart for details).    MDM: 1. Dysuria-Rx'd Bactrim. Advised patient to take medication as directed with food to completion.  Encouraged patient to  increase daily water intake while taking this medication.  Advised patient we will follow-up with her with urine culture results once received.  Discharged home, hemodynamically stable. Final Clinical Impressions(s) / UC Diagnoses   Final diagnoses:  Dysuria  Cystitis  Acute cystitis with hematuria     Discharge Instructions      Advised patient to take medication as directed with food to completion.  Encouraged patient to increase  daily water intake while taking this medication.  Advised patient we will follow-up with her with urine culture results once received.     ED Prescriptions     Medication Sig Dispense Auth. Provider   sulfamethoxazole-trimethoprim (BACTRIM DS) 800-160 MG tablet Take 1 tablet by mouth 2 (two) times daily for 3 days. 6 tablet Eliezer Lofts, FNP      PDMP not reviewed this encounter.   Eliezer Lofts, FNP 05/23/21 1300

## 2021-05-23 NOTE — ED Triage Notes (Signed)
Patient presents to Urgent Care with complaints of dysuria since 2 days ago. Patient reports having increased dysuria. Denies any low back pain. Had hernia surgery repair on 02/16/21. Average water intake

## 2021-05-25 LAB — URINE CULTURE
MICRO NUMBER:: 12302405
SPECIMEN QUALITY:: ADEQUATE

## 2021-06-01 ENCOUNTER — Ambulatory Visit: Payer: Medicare Other | Admitting: Family Medicine

## 2021-06-01 ENCOUNTER — Ambulatory Visit (INDEPENDENT_AMBULATORY_CARE_PROVIDER_SITE_OTHER): Payer: Medicare Other

## 2021-06-01 ENCOUNTER — Ambulatory Visit (INDEPENDENT_AMBULATORY_CARE_PROVIDER_SITE_OTHER): Payer: Medicare Other | Admitting: Sports Medicine

## 2021-06-01 ENCOUNTER — Other Ambulatory Visit: Payer: Self-pay

## 2021-06-01 DIAGNOSIS — M150 Primary generalized (osteo)arthritis: Secondary | ICD-10-CM

## 2021-06-01 DIAGNOSIS — M1711 Unilateral primary osteoarthritis, right knee: Secondary | ICD-10-CM | POA: Diagnosis not present

## 2021-06-01 DIAGNOSIS — M5030 Other cervical disc degeneration, unspecified cervical region: Secondary | ICD-10-CM

## 2021-06-01 DIAGNOSIS — M8949 Other hypertrophic osteoarthropathy, multiple sites: Secondary | ICD-10-CM | POA: Diagnosis not present

## 2021-06-01 DIAGNOSIS — M25462 Effusion, left knee: Secondary | ICD-10-CM | POA: Diagnosis not present

## 2021-06-01 DIAGNOSIS — M19012 Primary osteoarthritis, left shoulder: Secondary | ICD-10-CM

## 2021-06-01 DIAGNOSIS — Z09 Encounter for follow-up examination after completed treatment for conditions other than malignant neoplasm: Secondary | ICD-10-CM

## 2021-06-01 DIAGNOSIS — M159 Polyosteoarthritis, unspecified: Secondary | ICD-10-CM

## 2021-06-01 MED ORDER — CELECOXIB 200 MG PO CAPS: ORAL_CAPSULE | ORAL | 3 refills | Status: DC

## 2021-06-01 NOTE — Progress Notes (Signed)
    Procedures performed today:    Procedure: Real-time Ultrasound Guided aspiration/injection of the right knee Device: Samsung HS60  Verbal informed consent obtained.  Time-out conducted.  Noted no overlying erythema, induration, or other signs of local infection.  Skin prepped in a sterile fashion.  Local anesthesia: Topical Ethyl chloride.  With sterile technique and under real time ultrasound guidance: Using an 18-gauge needle aspirated 9 mL of clear, straw-colored fluid, syringe switched and 1 cc Kenalog 40, 2 cc lidocaine, 2 cc bupivacaine injected easily Completed without difficulty  Advised to call if fevers/chills, erythema, induration, drainage, or persistent bleeding.  Images permanently stored and available for review in PACS.  Impression: Technically successful ultrasound guided injection.  Independent interpretation of notes and tests performed by another provider:   I did personally review the C-spine x-rays which show severe multilevel cervical DDD with several levels of anterolisthesis.  Brief History, Exam, Impression, and Recommendations:    Primary osteoarthritis of right knee Increasing pain in the right knee, lateral aspect, she does have complaints of buckling. X-rays. She has an effusion on exam, lateral joint line pain, knee is however stable, aspiration and injection today. I would like her to add some knee physical therapy. Return to see me in 6 weeks, viscosupplementation if no better.  DDD (degenerative disc disease), cervical Severe cervical DDD on x-rays, continue baclofen for now, she will restart her Celebrex, continue physical therapy per Return to see me in 4 to 6 weeks for this, we will consider MRI for epidural planning if not significantly better.    ___________________________________________ Gwen Her. Dianah Field, M.D., ABFM., CAQSM. Primary Care and [place] Instructor of [place] [employer] [facility] [employer] [facility]

## 2021-06-01 NOTE — Assessment & Plan Note (Signed)
Increasing pain in the right knee, lateral aspect, she does have complaints of buckling. X-rays. She has an effusion on exam, lateral joint line pain, knee is however stable, aspiration and injection today. I would like her to add some knee physical therapy. Return to see me in 6 weeks, viscosupplementation if no better.

## 2021-06-01 NOTE — Assessment & Plan Note (Signed)
Severe cervical DDD on x-rays, continue baclofen for now, she will restart her Celebrex, continue physical therapy per Return to see me in 4 to 6 weeks for this, we will consider MRI for epidural planning if not significantly better.

## 2021-06-02 ENCOUNTER — Telehealth: Payer: Self-pay

## 2021-06-02 NOTE — Telephone Encounter (Signed)
Patient called to inquire if she should be taking a low dose aspirin while she is on the Celebrex to protect herself against heart attack and stroke. Please advise.

## 2021-06-03 NOTE — Telephone Encounter (Signed)
Absolutely fine but I would not go higher than an 81 mg aspirin.

## 2021-06-03 NOTE — Telephone Encounter (Signed)
Called pt to advise, no answer. Left message to return call.

## 2021-06-04 ENCOUNTER — Encounter: Payer: Self-pay | Admitting: Rehabilitative and Restorative Service Providers"

## 2021-06-04 ENCOUNTER — Other Ambulatory Visit: Payer: Self-pay

## 2021-06-04 ENCOUNTER — Ambulatory Visit: Payer: Medicare Other | Admitting: Rehabilitative and Restorative Service Providers"

## 2021-06-04 DIAGNOSIS — R293 Abnormal posture: Secondary | ICD-10-CM

## 2021-06-04 DIAGNOSIS — G8929 Other chronic pain: Secondary | ICD-10-CM | POA: Diagnosis not present

## 2021-06-04 DIAGNOSIS — R29898 Other symptoms and signs involving the musculoskeletal system: Secondary | ICD-10-CM | POA: Diagnosis not present

## 2021-06-04 DIAGNOSIS — R531 Weakness: Secondary | ICD-10-CM | POA: Diagnosis not present

## 2021-06-04 DIAGNOSIS — M25561 Pain in right knee: Secondary | ICD-10-CM

## 2021-06-04 DIAGNOSIS — M542 Cervicalgia: Secondary | ICD-10-CM

## 2021-06-04 NOTE — Therapy (Signed)
[facility] [place], [place], [address] Phone: [phone]   Fax:  [phone]  Physical Therapy Treatment and Re-evaluation   Patient Details  Name: Anna [name] MRN: [id number] Date of Birth: 1936-09-18 Referring Provider (PT): Dr Dianah Field   Encounter Date: 06/04/2021   PT End of Session - 06/04/21 1455     Visit Number 4    Number of Visits 16    Date for PT Re-Evaluation 07/16/21    PT Start Time [id number]    PT Stop Time 1543    PT Time Calculation (min) 52 min    Activity Tolerance Patient tolerated treatment well             Past Medical History:  Diagnosis Date   Dermatitis    seborrehic scalp   Hiatal hernia    History of basal cell carcinoma 07/20/2017   On nose.    History of melanoma 07/20/2017   Right upper arm    Hyperlipidemia    Macular degeneration     Past Surgical History:  Procedure Laterality Date   BREAST CYST EXCISION Right 09/1962   CATARACT EXTRACTION  03/2015   melonoma Right 06/2016   arm   MOHS SURGERY Left 06/2016   nose    There were no vitals filed for this visit.   Subjective Assessment - 06/04/21 1456     Subjective Patient reports that neck is feeling much better. She has a "tad" of discomfort mostly at night. She has had buckling of Rt knee but no falls. She had swelling in Rt knee. Dr T removed fluid from the Rt knee and wants her to come in for therapy for neck and for knees    Currently in Pain? No/denies    Pain Score 0-No pain    Pain Location Neck    Pain Orientation Left;Posterior;Upper    Pain Descriptors / Indicators Nagging    Pain Type Acute pain    Pain Radiating Towards Lt ear to shoulder    Pain Onset More than a month ago    Pain Frequency Intermittent    Aggravating Factors  mild pain with turning head to Lt; worse at night    Pain Relieving Factors PT; meds; pillow; heat                [facility] PT Assessment - 06/04/21  0001       Assessment   Medical Diagnosis Lt cervical dysfunction; OA Rt > Lt knees    Referring Provider (PT) Dr Dianah Field    Onset Date/Surgical Date 01/24/21    Hand Dominance Right    Next MD Visit 07/13/21    Prior Therapy treatment for Lt shoulder and LBP here      Home Environment   Living Environment Private residence    [place] One level    Daniel bars - toilet      Prior Function   Level of [place] Retired    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    Leisure piano; out with sister ~ weekly; shopping; TV/movies sits in a recliner      Observation/Other Assessments   Focus on Therapeutic Outcomes (FOTO)  69 - goal accomplished      AROM   Right/Left Shoulder --  WFL's bilat shoulders tight end ranges   Right Hip Extension -5    Right Hip Flexion --   WFL's   Left Hip Extension -5    Left Hip Flexion --   WFL's   Right Knee Extension 0    Right Knee Flexion 120    Left Knee Extension 0    Left Knee Flexion 120    Cervical Flexion 43    Cervical Extension 26    Cervical - Right Side Bend 18    Cervical - Left Side Bend 19    Cervical - Right Rotation 39    Cervical - Left Rotation 40      Strength   Overall Strength Comments WFL's for functional tasks bilat UE's    Right Hip Flexion 4+/5    Right Hip Extension 3/5    Right Hip ABduction 3/5    Left Hip Flexion 4+/5    Left Hip Extension 3-/5    Left Hip ABduction 3+/5    Right Knee Flexion 5/5    Right Knee Extension 5/5    Left Knee Flexion 5/5    Left Knee Extension 5/5      Flexibility   Hamstrings WFL's bilat    Quadriceps tight ~ 90 deg Rt; 95 deg Lt    ITB WFL's bilat    Piriformis tight Rt > Lt      Palpation   Spinal mobility hypomobile thoracic and cervical spine with PA and lateral mobs    Palpation comment  muscular tightness Lt > Rt ant/lat/posterior cervical musculature; pecs; upper traps; thoracic paraspinals                           [facility] Adult PT Treatment/Exercise - 06/04/21 0001       Neck Exercises: Supine   Neck Retraction 5 reps;5 secs;10 secs    Other Supine Exercise nodding yes/nodding no      Knee/Hip Exercises: Stretches   Piriformis Stretch Right;Left;3 reps;30 seconds    Piriformis Stretch Limitations supine travell      Knee/Hip Exercises: Supine   Other Supine Knee/Hip Exercises hip abduction green TB in hooklying alternating LE's x 10 each LE      Moist Heat Therapy   Number Minutes Moist Heat 10 Minutes    Moist Heat Location Cervical      Manual Therapy   Manual therapy comments sp supine    Joint Mobilization cervical PA and lateral glides    Soft tissue mobilization ant/lat/posterior cervical musculature; upper trap; leveator Lt > Rt                     PT Education - 06/04/21 1525     Education Details HEP POC aquatic therapy    Person(s) Educated Patient    Methods Explanation;Demonstration;Tactile cues;Verbal cues;Handout    Comprehension Verbalized understanding;Returned demonstration;Verbal cues required;Tactile cues required                 PT Long Term Goals - 06/04/21 1544       PT LONG TERM GOAL #1   Title Improve posture and alignment with patient ot demonstrated improved upright posture with postreior shoulder girdle engaged    Time 6    Period Weeks    Status Revised    Target Date 07/16/21      PT LONG TERM GOAL #2   Title Increase strength bilat LE's allowing patient to return to normal  functional activities without "buckling in knee"    Time 6    Period Weeks    Status New    Target Date 07/16/21      PT LONG TERM GOAL #3   Title Increase strength bilat hips to 4+/5 to 5/5    Time 6    Period Weeks    Status New    Target Date 07/16/21      PT LONG TERM GOAL #4   Title Independent in  HEP    Time 6    Period Weeks    Status New    Target Date 07/16/21      PT LONG TERM GOAL #5   Title Improve functional limitation score to 60    Time 6    Period Weeks    Status Achieved                   Plan - 06/04/21 1515     Clinical Impression Statement Anna [name] returns reporting good improvement in cervical pain and function from therapy. She reports some "buckling of Rt knee" when standing and walking. Patient has continued forward posture and alignment; decreased Rt > Lt hip mobility and strength; functional weakness; tender to palpation Lt > Rt cervical muaculature, Rt > Lt posterior hip. Patient will benefit from PT to address problems identified. She is very interested in aquatic therapy for strengthening.    Stability/Clinical Decision Making Stable/Uncomplicated    Clinical Decision Making Low    Rehab Potential Good    PT Frequency 2x / week    PT Duration 6 weeks    PT Treatment/Interventions ADLs/Self Care Home Management;Aquatic Therapy;Cryotherapy;Electrical Stimulation;Iontophoresis '4mg'$ /ml Dexamethasone;Moist Heat;Ultrasound;Therapeutic activities;Therapeutic exercise;Balance training;Neuromuscular re-education;Patient/family education;Manual techniques;Dry needling;Taping    PT Next Visit Plan review HEP; progress with LE strengthening; progress with posterior shoulder girdle strengthening and cervical mobility; manual work through anterior chest/pecs; upper trap; ant/lat/posterior cervical musculature; modalities as indicated; aquatic therapy if she can arrange transportation    PT [place] and Agree with Plan of Care Patient             Patient will benefit from skilled therapeutic intervention in order to improve the following deficits and impairments:  Decreased range of motion, Pain, Decreased activity tolerance, Hypomobility, Impaired flexibility, Improper body mechanics, Other (comment), Decreased mobility, Postural  dysfunction, Decreased strength  Visit Diagnosis: Cervicalgia  Other symptoms and signs involving the musculoskeletal system  Abnormal posture  Chronic pain of right knee  Weakness generalized     Problem List Patient Active Problem List   Diagnosis Date Noted   DDD (degenerative disc disease), cervical 05/04/2021   Neck pain 05/03/2021   Anorexia 99991111   Umbilical hernia without obstruction and without gangrene 06/30/2020   Meralgia paresthetica of right side 06/22/2020   Venous insufficiency of both lower extremities 06/22/2020   Primary osteoarthritis, left shoulder 06/03/2020   Bilateral lower extremity edema 05/25/2020   Aortic atherosclerosis ([place]) 05/13/2020   Bunion, right foot 0000000   Lichen planus 0000000   Asthma in adult 11/22/2019   Mixed restrictive and obstructive lung disease ([place]) 11/22/2019   Primary osteoarthritis of right knee 11/22/2019   Acute pain of left shoulder 09/05/2019   Chronic bilateral low back pain without sciatica 09/05/2019   OAB (overactive bladder) 08/08/2019   Insomnia 08/08/2019   History of melanoma 07/20/2017   History of basal cell carcinoma 07/20/2017   IFG (impaired fasting glucose) 07/17/2017  Macular degeneration    Chronic fatigue 08/06/2015   Gastroesophageal reflux disease without esophagitis 08/06/2015   Glaucoma 08/06/2015   Hiatal hernia 08/06/2015   Melanoma of right upper arm ([place]) 08/06/2015   Mixed hyperlipidemia 08/06/2015   Rupture of right biceps tendon 08/06/2015   Biceps tendonitis of both shoulders 08/06/2015    Rossie Scarfone Nilda Simmer, PT, MPH  06/04/2021, 3:54 PM  [facility] [place], [place], [address] Phone: [phone]   Fax:  [phone]  Name: Anna [name] MRN: [id number] Date of Birth: Aug 30, 1936

## 2021-06-04 NOTE — Patient Instructions (Addendum)
Aquatic Therapy: What to Expect!  Where:  [facility] [address] [place], [place] [address] [phone]           How to Prepare:  If you require assistance with dressing, with transportation (ie: wheel chair), or toileting, a caregiver must attend the entire session with you (unless your primary therapists feels this is not necessary).   If there is thunder during your appointment, you will be asked to leave pool area. You have the option to finish your session in the physical therapy area near the gym. Masks in the pool area are optional. Your face will remain dry during your session, so you are welcome to keep your mask on, if desired. You will be spaced at least 6 feet from other aquatic patients.  Please bring your own swim towel to dry off with.   There are Men's and Women's locker rooms with showers, as well as gender neutral bathrooms in the pool area.  Please arrive IN YOUR SUIT and a few minutes prior to your appointment - this helps to avoid delays in starting your session. Head to the pool and await your appointment on the bench on the pool deck. Please make sure to attend to any toileting needs prior to entering the pool. Once on the pool deck your therapist will ask you to sign the Patient  Consent and Assignment of Benefits form. Your therapist may take your blood pressure prior to, during and after your session if indicated. We usually try and create a home exercise program based on activities we do in the pool. Some patients do not want to or do not have the ability to participate in an aquatic home program - this is not a barrier in any way to you participating in aquatic therapy as part of your current therapy plan!  Appointments:  All sessions are 45 minutes  About the pool: Entering the pool: Your therapist will assist you if needed; there are two ways to enter the pool - stairs or a mechanical lift. Your therapist will determine  the most appropriate way for you. Water temperature is usually around 91-95.  There is a lap pool with a temperature around 84 There may be other therapists and patients in the pool at the same time.   Contact Info:            To cancel appointment, please call [place] at [phone] If you are running late, please call [facility] Programs Location and contact info description of services cost  [facility] [employer] [facility] [place], [place] [phone]  Aquatics, silver sneakers classes, yoga, exercise equipment $18/month for 55+ $25/month individual $40-$60/month family  [facility] [address] [place], [place] [phone] Transportation to appointments. Activities (stretching, dancing, Tai Chi)  Mostly free  [facility] [address] [place], [place] [phone]  [facility] Fee based  healthwise exercise program Various locations [phone]  [place] exercise free  [facility] Various locations [phone] [place], [place], [place], Table tennis, chair volleyball, petanque  free  [facility] [place], [place]  [phone] Various activities (drum exercise, yoga, line dancing, etc)  Free  Access Code: [email or web address] URL: [email or web address] Date: 06/04/2021 Prepared by: Gillermo Murdoch  Exercises Seated Cervical Retraction - 3 x daily - 7 x weekly - 10 reps - 1 sets Standing Scapular Retraction - 3 x daily - 7 x weekly - 10 reps - 1 sets - 10 hold Shoulder External Rotation and Scapular Retraction - 3 x daily - 7 x weekly - 1 sets - 10 reps - 3 hold Shoulder External Rotation in 45 Degrees Abduction - 2 x daily - 7 x weekly - 1-2 sets - 10 reps - 3 sec hold Doorway  Pec Stretch at 60 Degrees Abduction - 3 x daily - 7 x weekly - 3 reps - 1 sets Doorway Pec Stretch at 90 Degrees Abduction - 3 x daily - 7 x weekly - 3 reps - 1 sets - 30 seconds hold Doorway Pec Stretch at 120 Degrees Abduction - 3 x daily - 7 x weekly - 3 reps - 1 sets - 30 second hold hold Supine Cervical Retraction with Towel - 2 x daily - 7 x weekly - 1 sets - 5-10 reps - 10 sec hold Shoulder External Rotation and Scapular Retraction with Resistance - 2 x daily - 7 x weekly - 10 reps - 3 sets Scapular Retraction with Resistance - 2 x daily - 7 x weekly - 10 reps - 3 sets Scapular Retraction with Resistance Advanced - 2 x daily - 7 x weekly - 10 reps - 3 sets Supine Piriformis Stretch with Leg Straight - 2 x daily - 7 x weekly - 1 sets - 3 reps - 30 sec hold Hooklying Isometric Clamshell - 2 x daily - 7 x weekly - 1 sets - 10 reps - 3 sec hold

## 2021-06-04 NOTE — Telephone Encounter (Signed)
Patient aware of Dr. Darene Lamer recommendation.

## 2021-06-11 ENCOUNTER — Encounter: Payer: Self-pay | Admitting: Rehabilitative and Restorative Service Providers"

## 2021-06-11 ENCOUNTER — Ambulatory Visit: Payer: Medicare Other | Admitting: Rehabilitative and Restorative Service Providers"

## 2021-06-11 ENCOUNTER — Other Ambulatory Visit: Payer: Self-pay

## 2021-06-11 DIAGNOSIS — R29898 Other symptoms and signs involving the musculoskeletal system: Secondary | ICD-10-CM

## 2021-06-11 DIAGNOSIS — G8929 Other chronic pain: Secondary | ICD-10-CM

## 2021-06-11 DIAGNOSIS — M25561 Pain in right knee: Secondary | ICD-10-CM | POA: Diagnosis not present

## 2021-06-11 DIAGNOSIS — R293 Abnormal posture: Secondary | ICD-10-CM

## 2021-06-11 DIAGNOSIS — M542 Cervicalgia: Secondary | ICD-10-CM

## 2021-06-11 DIAGNOSIS — R531 Weakness: Secondary | ICD-10-CM

## 2021-06-11 NOTE — Patient Instructions (Signed)

## 2021-06-11 NOTE — Therapy (Signed)
[facility] [place], [place], [address] Phone: [phone]   Fax:  [phone]  Physical Therapy Treatment  Patient Details  Name: Anna [name] MRN: [id number] Date of Birth: 07/09/1936 Referring Provider (PT): Dr Dianah Field   Encounter Date: 06/11/2021   PT End of Session - 06/11/21 1012     Visit Number 5    Number of Visits 16    Date for PT Re-Evaluation 07/16/21    PT Start Time 1012    PT Stop Time 1100    PT Time Calculation (min) 48 min    Activity Tolerance Patient tolerated treatment well             Past Medical History:  Diagnosis Date   Dermatitis    seborrehic scalp   Hiatal hernia    History of basal cell carcinoma 07/20/2017   On nose.    History of melanoma 07/20/2017   Right upper arm    Hyperlipidemia    Macular degeneration     Past Surgical History:  Procedure Laterality Date   BREAST CYST EXCISION Right 09/1962   CATARACT EXTRACTION  03/2015   melonoma Right 06/2016   arm   MOHS SURGERY Left 06/2016   nose    There were no vitals filed for this visit.   Subjective Assessment - 06/11/21 1013     Subjective Patient reports that she is tired from shopping yesterday. Neck is feeling better but she has one spot at the base of her head on the Lt. Not sure what she has done to keep it irritated.    Currently in Pain? No/denies    Pain Score 0-No pain    Pain Location Neck    Pain Orientation Left;Posterior;Upper    Pain Descriptors / Indicators Tightness    Pain Type Acute pain                [facility] PT Assessment - 06/11/21 0001       Assessment   Medical Diagnosis Lt cervical dysfunction; OA Rt > Lt knees    Referring Provider (PT) Dr Dianah Field    Onset Date/Surgical Date 01/24/21    Hand Dominance Right    Next MD Visit 07/13/21    Prior Therapy treatment for Lt shoulder and LBP here      Palpation   Palpation comment muscular tightness Lt  suboccipital area; Lt > Rt ant/lat/posterior cervical musculature; pecs; upper traps; thoracic paraspinals                           [facility] Adult PT Treatment/Exercise - 06/11/21 0001       Neck Exercises: Supine   Neck Retraction 5 reps;5 secs;10 secs      Knee/Hip Exercises: Aerobic   Nustep L5 x 7 min UE 10      Shoulder Exercises: Standing   Extension Strengthening;Both;10 reps;Theraband    Theraband Level (Shoulder Extension) Level 2 (Red)    Row Strengthening;Both;10 reps;Theraband    Theraband Level (Shoulder Row) Level 2 (Red)    Retraction Strengthening;Both;10 reps;Theraband    Theraband Level (Shoulder Retraction) Level 1 (Yellow)    Other Standing Exercises axial extension 5 sec x 5 reps; scap squeeze 5 sec x 5 reps; L's x 10; W's x 10 with noodle along spine      Moist Heat Therapy   Number Minutes Moist Heat 10 Minutes    Moist Heat Location Cervical  Health and safety inspector Parameters to tolerance    Electrical Stimulation Goals Pain;Tone      Manual Therapy   Manual therapy comments skilled palpation to assess response to DN and manual work    Joint Mobilization cervical PA and lateral glides    Soft tissue mobilization ant/lat/posterior cervical musculature; upper trap; leveator Lt > Rt    Passive ROM cervical flexion; Rt lateral flexion 10-15 sec hold x 2 each    Manual Traction cervical traction 10-15 sec hold x 5 reps              Trigger Point Dry Needling - 06/11/21 0001     Consent Given? Yes    Education Handout Provided Yes    Muscles Treated Head and Neck Suboccipitals;Upper trapezius;Oblique capitus;Scalenes;Cervical multifidi    Dry Needling Comments Lt    Suboccipitals Response Palpable increased muscle length                   PT Education - 06/11/21 1031     Education Details DN     Person(s) Educated Patient    Methods Explanation;Handout    Comprehension Verbalized understanding                 PT Long Term Goals - 06/04/21 1544       PT LONG TERM GOAL #1   Title Improve posture and alignment with patient ot demonstrated improved upright posture with postreior shoulder girdle engaged    Time 6    Period Weeks    Status Revised    Target Date 07/16/21      PT LONG TERM GOAL #2   Title Increase strength bilat LE's allowing patient to return to normal functional activities without "buckling in knee"    Time 6    Period Weeks    Status New    Target Date 07/16/21      PT LONG TERM GOAL #3   Title Increase strength bilat hips to 4+/5 to 5/5    Time 6    Period Weeks    Status New    Target Date 07/16/21      PT LONG TERM GOAL #4   Title Independent in HEP    Time 6    Period Weeks    Status New    Target Date 07/16/21      PT LONG TERM GOAL #5   Title Improve functional limitation score to 60    Time 6    Period Weeks    Status Achieved                   Plan - 06/11/21 1021     Clinical Impression Statement Patient reports that she added exercises for LE's. She continues to have some tightness in the Lt posterior cervical area at the base of her skull. Continued to work on strengthening and stabilization. Trial of DN Lt occipital area followed by manual work    Rehab Potential Good    PT Frequency 2x / week    PT Duration 6 weeks    PT Treatment/Interventions ADLs/Self Care Home Management;Aquatic Therapy;Cryotherapy;Electrical Stimulation;Iontophoresis '4mg'$ /ml Dexamethasone;Moist Heat;Ultrasound;Therapeutic activities;Therapeutic exercise;Balance training;Neuromuscular re-education;Patient/family education;Manual techniques;Dry needling;Taping    PT Next Visit Plan review HEP; progress with LE strengthening; progress with posterior shoulder girdle strengthening and cervical mobility; manual work through anterior chest/pecs; upper  trap; ant/lat/posterior cervical  musculature; modalities as indicated; ; assess response to trail of DN Lt occipital area; aquatic therapy but it is difficult to arrange transportation    PT Home Exercise Plan [id number]    Consulted and Agree with Plan of Care Patient             Patient will benefit from skilled therapeutic intervention in order to improve the following deficits and impairments:     Visit Diagnosis: Cervicalgia  Other symptoms and signs involving the musculoskeletal system  Abnormal posture  Chronic pain of right knee  Weakness generalized     Problem List Patient Active Problem List   Diagnosis Date Noted   DDD (degenerative disc disease), cervical 05/04/2021   Neck pain 05/03/2021   Anorexia 99991111   Umbilical hernia without obstruction and without gangrene 06/30/2020   Meralgia paresthetica of right side 06/22/2020   Venous insufficiency of both lower extremities 06/22/2020   Primary osteoarthritis, left shoulder 06/03/2020   Bilateral lower extremity edema 05/25/2020   Aortic atherosclerosis ([place]) 05/13/2020   Bunion, right foot 0000000   Lichen planus 0000000   Asthma in adult 11/22/2019   Mixed restrictive and obstructive lung disease ([place]) 11/22/2019   Primary osteoarthritis of right knee 11/22/2019   Acute pain of left shoulder 09/05/2019   Chronic bilateral low back pain without sciatica 09/05/2019   OAB (overactive bladder) 08/08/2019   Insomnia 08/08/2019   History of melanoma 07/20/2017   History of basal cell carcinoma 07/20/2017   IFG (impaired fasting glucose) 07/17/2017   Macular degeneration    Chronic fatigue 08/06/2015   Gastroesophageal reflux disease without esophagitis 08/06/2015   Glaucoma 08/06/2015   Hiatal hernia 08/06/2015   Melanoma of right upper arm ([place]) 08/06/2015   Mixed hyperlipidemia 08/06/2015   Rupture of right biceps tendon 08/06/2015   Biceps tendonitis of both shoulders 08/06/2015    Scarleth Brame  Nilda Simmer, PT, MPH  06/11/2021, 10:54 AM  [facility] [address] [place], [place], [address] Phone: [phone]   Fax:  [phone]  Name: Myrth Shann MRN: [id number] Date of Birth: 1936/09/11

## 2021-06-12 ENCOUNTER — Other Ambulatory Visit: Payer: Self-pay | Admitting: Family Medicine

## 2021-06-12 DIAGNOSIS — K219 Gastro-esophageal reflux disease without esophagitis: Secondary | ICD-10-CM

## 2021-06-14 ENCOUNTER — Other Ambulatory Visit: Payer: Self-pay

## 2021-06-14 ENCOUNTER — Encounter: Payer: Self-pay | Admitting: Rehabilitative and Restorative Service Providers"

## 2021-06-14 ENCOUNTER — Ambulatory Visit (INDEPENDENT_AMBULATORY_CARE_PROVIDER_SITE_OTHER): Payer: Medicare Other | Admitting: Rehabilitative and Restorative Service Providers"

## 2021-06-14 DIAGNOSIS — M542 Cervicalgia: Secondary | ICD-10-CM

## 2021-06-14 DIAGNOSIS — R293 Abnormal posture: Secondary | ICD-10-CM

## 2021-06-14 DIAGNOSIS — R531 Weakness: Secondary | ICD-10-CM | POA: Diagnosis not present

## 2021-06-14 DIAGNOSIS — G8929 Other chronic pain: Secondary | ICD-10-CM

## 2021-06-14 DIAGNOSIS — R29898 Other symptoms and signs involving the musculoskeletal system: Secondary | ICD-10-CM

## 2021-06-14 DIAGNOSIS — M25561 Pain in right knee: Secondary | ICD-10-CM

## 2021-06-14 NOTE — Therapy (Addendum)
[place], [place], [address] Phone: [phone]   Fax:  [phone]  Physical Therapy Treatment  Patient Details  Name: Anna [name] MRN: [id number] Date of Birth: 1935/11/05 Referring Provider (PT): Dr Dianah Field   Encounter Date: 06/14/2021   PT End of Session - 06/14/21 1153     Visit Number 6    Number of Visits 16    Date for PT Re-Evaluation 07/16/21    PT Start Time 1148    PT Stop Time 1236    PT Time Calculation (min) 48 min    Activity Tolerance Patient tolerated treatment well             Past Medical History:  Diagnosis Date   Dermatitis    seborrehic scalp   Hiatal hernia    History of basal cell carcinoma 07/20/2017   On nose.    History of melanoma 07/20/2017   Right upper arm    Hyperlipidemia    Macular degeneration     Past Surgical History:  Procedure Laterality Date   BREAST CYST EXCISION Right 09/1962   CATARACT EXTRACTION  03/2015   melonoma Right 06/2016   arm   MOHS SURGERY Left 06/2016   nose    There were no vitals filed for this visit.   Subjective Assessment - 06/14/21 1154     Subjective Patient reports that she did well with the dry needling. Neck is feeling better. Would like to try more needling. Needling is really working. Neck moves better.Working on her exercises at home. Using the cervical pillow backwards at home and it works well to support her head and neck.    Currently in Pain? No/denies    Pain Score 0-No pain    Pain Location Neck    Pain Orientation Left;Posterior;Upper    Pain Descriptors / Indicators Tightness                [facility] PT Assessment - 06/14/21 0001       Assessment   Medical Diagnosis Lt cervical dysfunction; OA Rt > Lt knees    Referring Provider (PT) Dr Dianah Field    Onset Date/Surgical Date 01/24/21    Hand Dominance Right    Next MD Visit 07/13/21    Prior Therapy treatment for Lt shoulder  and LBP here      AROM   Cervical Flexion 48    Cervical Extension 48    Cervical - Right Side Bend 21    Cervical - Left Side Bend 22    Cervical - Right Rotation 47    Cervical - Left Rotation 48                           [facility] Adult PT Treatment/Exercise - 06/14/21 0001       Knee/Hip Exercises: Aerobic   Nustep L5 x 7 min UE 10      Knee/Hip Exercises: Standing   Hip Abduction Stengthening;Right;Left;10 reps;Knee straight    Hip Extension Stengthening;Right;Left;10 reps;Knee straight    Wall Squat 5 reps;5 seconds    SLS 10 sec x 3 reps each LE      Knee/Hip Exercises: Seated   Sit to Sand 10 reps;without UE support      Moist Heat Therapy   Number Minutes Moist Heat 10 Minutes    Moist Heat Location Cervical      Electrical Stimulation   Electrical Stimulation Location Lt  cervical/shoulder    Recruitment consultant Parameters to tolerance    Electrical Stimulation Goals Pain;Tone      Manual Therapy   Manual therapy comments skilled palpation to assess response to DN and manual work    Joint Mobilization cervical PA and lateral glides    Soft tissue mobilization ant/lat/posterior cervical musculature; upper trap; leveator Lt > Rt              Trigger Point Dry Needling - 06/14/21 0001     Consent Given? Yes    Education Handout Provided Previously provided    Dry Needling Comments Lt/Rt    Suboccipitals Response Palpable increased muscle length    Scalenes Response Palpable increased muscle length   Lt   Cervical multifidi Response Palpable increased muscle length   Lt                  PT Education - 06/14/21 1231     Education Details HEP    Person(s) Educated Patient    Methods Explanation;Demonstration;Tactile cues;Verbal cues;Handout    Comprehension Verbalized understanding;Returned demonstration;Verbal cues required;Tactile cues required                 PT Long Term  Goals - 06/04/21 1544       PT LONG TERM GOAL #1   Title Improve posture and alignment with patient ot demonstrated improved upright posture with postreior shoulder girdle engaged    Time 6    Period Weeks    Status Revised    Target Date 07/16/21      PT LONG TERM GOAL #2   Title Increase strength bilat LE's allowing patient to return to normal functional activities without "buckling in knee"    Time 6    Period Weeks    Status New    Target Date 07/16/21      PT LONG TERM GOAL #3   Title Increase strength bilat hips to 4+/5 to 5/5    Time 6    Period Weeks    Status New    Target Date 07/16/21      PT LONG TERM GOAL #4   Title Independent in HEP    Time 6    Period Weeks    Status New    Target Date 07/16/21      PT LONG TERM GOAL #5   Title Improve functional limitation score to 60    Time 6    Period Weeks    Status Achieved                   Plan - 06/14/21 1219     Clinical Impression Statement Good response to DN and manual work with decreased tightness through the cervical musculature. Note good gains in cervical mobility and ROM. Progressed LE strengthening.    Rehab Potential Good    PT Frequency 2x / week    PT Duration 6 weeks    PT Treatment/Interventions ADLs/Self Care Home Management;Aquatic Therapy;Cryotherapy;Electrical Stimulation;Iontophoresis '4mg'$ /ml Dexamethasone;Moist Heat;Ultrasound;Therapeutic activities;Therapeutic exercise;Balance training;Neuromuscular re-education;Patient/family education;Manual techniques;Dry needling;Taping    PT Next Visit Plan review HEP; progress with LE strengthening; progress with posterior shoulder girdle strengthening and cervical mobility; manual work through anterior chest/pecs; upper trap; ant/lat/posterior cervical musculature; modalities as indicated; continue DN bilat occipital area and cervical musculature; aquatic therapy but it is difficult to arrange transportation    PT Home Exercise Plan [id number]     Consulted and Agree with  Plan of Care Patient             Patient will benefit from skilled therapeutic intervention in order to improve the following deficits and impairments:     Visit Diagnosis: Cervicalgia  Other symptoms and signs involving the musculoskeletal system  Abnormal posture  Chronic pain of right knee  Weakness generalized     Problem List Patient Active Problem List   Diagnosis Date Noted   DDD (degenerative disc disease), cervical 05/04/2021   Neck pain 05/03/2021   Anorexia [id number]   Umbilical hernia without obstruction and without gangrene 06/30/2020   Meralgia paresthetica of right side 06/22/2020   Venous insufficiency of both lower extremities 06/22/2020   Primary osteoarthritis, left shoulder 06/03/2020   Bilateral lower extremity edema 05/25/2020   Aortic atherosclerosis ([place]) 05/13/2020   Bunion, right foot 0000000   Lichen planus 0000000   Asthma in adult 11/22/2019   Mixed restrictive and obstructive lung disease ([place]) 11/22/2019   Primary osteoarthritis of right knee 11/22/2019   Acute pain of left shoulder 09/05/2019   Chronic bilateral low back pain without sciatica 09/05/2019   OAB (overactive bladder) 08/08/2019   Insomnia 08/08/2019   History of melanoma 07/20/2017   History of basal cell carcinoma 07/20/2017   IFG (impaired fasting glucose) 07/17/2017   Macular degeneration    Chronic fatigue 08/06/2015   Gastroesophageal reflux disease without esophagitis 08/06/2015   Glaucoma 08/06/2015   Hiatal hernia 08/06/2015   Melanoma of right upper arm ([place]) 08/06/2015   Mixed hyperlipidemia 08/06/2015   Rupture of right biceps tendon 08/06/2015   Biceps tendonitis of both shoulders 08/06/2015    Naijah Lacek Nilda Simmer, PT, MPH 06/14/2021, 12:48 PM  [facility] [place] [address] [place], [place], [address] Phone: [phone]   Fax:  [phone]  Name: Anna [name] MRN: [id number] Date of Birth: 1935-11-17

## 2021-06-14 NOTE — Patient Instructions (Signed)
Access Code: [email or web address] URL: [email or web address] Date: 06/14/2021 Prepared by: Gillermo Murdoch  Exercises Seated Cervical Retraction - 3 x daily - 7 x weekly - 10 reps - 1 sets Standing Scapular Retraction - 3 x daily - 7 x weekly - 10 reps - 1 sets - 10 hold Shoulder External Rotation and Scapular Retraction - 3 x daily - 7 x weekly - 1 sets - 10 reps - 3 hold Shoulder External Rotation in 45 Degrees Abduction - 2 x daily - 7 x weekly - 1-2 sets - 10 reps - 3 sec hold Doorway Pec Stretch at 60 Degrees Abduction - 3 x daily - 7 x weekly - 3 reps - 1 sets Doorway Pec Stretch at 90 Degrees Abduction - 3 x daily - 7 x weekly - 3 reps - 1 sets - 30 seconds hold Doorway Pec Stretch at 120 Degrees Abduction - 3 x daily - 7 x weekly - 3 reps - 1 sets - 30 second hold hold Supine Cervical Retraction with Towel - 2 x daily - 7 x weekly - 1 sets - 5-10 reps - 10 sec hold Shoulder External Rotation and Scapular Retraction with Resistance - 2 x daily - 7 x weekly - 10 reps - 3 sets Scapular Retraction with Resistance - 2 x daily - 7 x weekly - 10 reps - 3 sets Scapular Retraction with Resistance Advanced - 2 x daily - 7 x weekly - 10 reps - 3 sets Supine Piriformis Stretch with Leg Straight - 2 x daily - 7 x weekly - 1 sets - 3 reps - 30 sec hold Hooklying Isometric Clamshell - 2 x daily - 7 x weekly - 1 sets - 10 reps - 3 sec hold Sit to Stand - 2 x daily - 7 x weekly - 1 sets - 10 reps - 3-5 sec hold Wall Quarter Squat - 2 x daily - 7 x weekly - 1-2 sets - 10 reps - 5-10 sec hold Single Leg Stance with Support - 2 x daily - 7 x weekly - 1 sets - 3 reps - 10-30 sec hold Standing Hip Extension with Counter Support - 2 x daily - 7 x weekly - 1-2 sets - 10 reps - 3-5 sec hold Standing Hip Abduction with Counter Support - 2 x daily - 7 x weekly - 2-3 sets - 10 reps - 2-3 sec hold

## 2021-06-16 ENCOUNTER — Other Ambulatory Visit: Payer: Self-pay

## 2021-06-16 ENCOUNTER — Ambulatory Visit: Payer: Medicare Other | Admitting: Physical Therapy

## 2021-06-16 DIAGNOSIS — M25561 Pain in right knee: Secondary | ICD-10-CM

## 2021-06-16 DIAGNOSIS — R293 Abnormal posture: Secondary | ICD-10-CM

## 2021-06-16 DIAGNOSIS — R29898 Other symptoms and signs involving the musculoskeletal system: Secondary | ICD-10-CM | POA: Diagnosis not present

## 2021-06-16 DIAGNOSIS — G8929 Other chronic pain: Secondary | ICD-10-CM

## 2021-06-16 DIAGNOSIS — M542 Cervicalgia: Secondary | ICD-10-CM

## 2021-06-16 NOTE — Therapy (Signed)
[place], [place], [address] Phone: [phone]   Fax:  [phone]  Physical Therapy Treatment  Patient Details  Name: Anna [name] MRN: [id number] Date of Birth: December 28, 1935 Referring Provider (PT): Dr Dianah Field   Encounter Date: 06/16/2021   PT End of Session - 06/16/21 1041     Visit Number 7    Number of Visits 16    Date for PT Re-Evaluation 07/16/21    PT Start Time 1021    PT Stop Time 1105    PT Time Calculation (min) 44 min    Activity Tolerance Patient tolerated treatment well;No increased pain    Behavior During Therapy [facility] for tasks assessed/performed             Past Medical History:  Diagnosis Date   Dermatitis    seborrehic scalp   Hiatal hernia    History of basal cell carcinoma 07/20/2017   On nose.    History of melanoma 07/20/2017   Right upper arm    Hyperlipidemia    Macular degeneration     Past Surgical History:  Procedure Laterality Date   BREAST CYST EXCISION Right 09/1962   CATARACT EXTRACTION  03/2015   melonoma Right 06/2016   arm   MOHS SURGERY Left 06/2016   nose    There were no vitals filed for this visit.   Subjective Assessment - 06/16/21 1026     Subjective Pt reports that her neck is feeling much better since the DN last session.  She reports her Rt knee buckled 2x this week while she was walking.    Pertinent History abdominal surgery 02/16/21; arthritis; osteoporesis; anxiety; shoulder pain    Patient Stated Goals get rid of the neck pain and turn her head side to side with no pain    Currently in Pain? No/denies                [facility] PT Assessment - 06/16/21 0001       Assessment   Medical Diagnosis Lt cervical dysfunction; OA Rt > Lt knees    Referring Provider (PT) Dr Dianah Field    Onset Date/Surgical Date 01/24/21    Hand Dominance Right    Next MD Visit 07/13/21    Prior Therapy treatment for Lt shoulder and  LBP here               [facility] Adult PT Treatment/Exercise - 06/16/21 0001       Knee/Hip Exercises: Stretches   Passive Hamstring Stretch Right;Left;2 reps;20 seconds   seated with hip hinge   Hip Flexor Stretch Right;Left;2 reps;20 seconds   seated with leg back   Piriformis Stretch Right;Left;2 reps;20 seconds   seated     Knee/Hip Exercises: Aerobic   Nustep L4: 7 min arms/legs for warm up      Knee/Hip Exercises: Standing   Hip Abduction Stengthening;Right;1 set;10 reps;Left;5 reps    Abduction Limitations Rt knee buckled at rep 5 of LLE hip abdct,    Lateral Step Up Right;1 set;10 reps;Left;5 reps;Hand Hold: 2;Step Height: 6"      Knee/Hip Exercises: Seated   Sit to Sand 2 sets;5 reps;without UE support   pausing 3 times on descent.  cues for hip hinge and forward weight shift.     Shoulder Exercises: Stretch   Other Shoulder Stretches doorway stretch x 3 positions  (all three slightly lower) 15 sec x 2 reps      Manual Therapy  Manual therapy comments 2 strips of sensitive skin rock tape applied in upside down horseshoe shape with 20% stretch below patella, crossing at tibial tuberosity.                     PT Education - 06/16/21 1054     Education Details HEP - added LE stretches. Ktape info.    Person(s) Educated Patient    Methods Explanation;Demonstration;Verbal cues;Handout    Comprehension Verbalized understanding;Returned demonstration                 PT Long Term Goals - 06/16/21 1123       PT LONG TERM GOAL #1   Title Improve posture and alignment with patient ot demonstrated improved upright posture with postreior shoulder girdle engaged    Time 6    Period Weeks    Status On-going      PT LONG TERM GOAL #2   Title Increase strength bilat LE's allowing patient to return to normal functional activities without "buckling in knee"    Time 6    Period Weeks    Status On-going      PT LONG TERM GOAL #3   Title Increase strength  bilat hips to 4+/5 to 5/5    Time 6    Period Weeks    Status On-going      PT LONG TERM GOAL #4   Title Independent in HEP    Time 6    Period Weeks    Status On-going      PT LONG TERM GOAL #5   Title Improve functional limitation score to 60    Baseline (neck - 65)    Time 6    Period Weeks    Status Achieved                   Plan - 06/16/21 1110     Clinical Impression Statement Pt had one episode of Rt knee buckling during Lt hip abdct exercise; able to self correct with UE on rail to steady. Functional weakness noted in Rt hip with lateral step ups. She required minor cues for ant weight shift and hip hinge for sit to/from stand. She is tolerating doorway and LE stretches well. Pt is progressing well towards LTGs.    Rehab Potential Good    PT Frequency 2x / week    PT Duration 6 weeks    PT Treatment/Interventions ADLs/Self Care Home Management;Aquatic Therapy;Cryotherapy;Electrical Stimulation;Iontophoresis 4mg /ml Dexamethasone;Moist Heat;Ultrasound;Therapeutic activities;Therapeutic exercise;Balance training;Neuromuscular re-education;Patient/family education;Manual techniques;Dry needling;Taping    PT Next Visit Plan Assesss response to ktape applied to distal Rt knee; progress with LE strengthening; progress with posterior shoulder girdle strengthening and cervical mobility; manual work through anterior chest/pecs; upper trap; ant/lat/posterior cervical musculature; modalities as indicated; continue DN bilat occipital area and cervical musculature; aquatic therapy but it is difficult to arrange transportation    PT Home Exercise Plan [email or web address]    Consulted and Agree with Plan of Care Patient             Patient will benefit from skilled therapeutic intervention in order to improve the following deficits and impairments:  Decreased range of motion, Pain, Decreased activity tolerance, Hypomobility, Impaired flexibility, Improper body mechanics, Other (comment),  Decreased mobility, Postural dysfunction, Decreased strength  Visit Diagnosis: Chronic pain of right knee  Abnormal posture  Other symptoms and signs involving the musculoskeletal system  Cervicalgia     Problem List Patient Active Problem List  Diagnosis Date Noted   DDD (degenerative disc disease), cervical 05/04/2021   Neck pain 05/03/2021   Anorexia 74/60/0298   Umbilical hernia without obstruction and without gangrene 06/30/2020   Meralgia paresthetica of right side 06/22/2020   Venous insufficiency of both lower extremities 06/22/2020   Primary osteoarthritis, left shoulder 06/03/2020   Bilateral lower extremity edema 05/25/2020   Aortic atherosclerosis ([place]) 05/13/2020   Bunion, right foot 47/30/8569   Lichen planus 43/70/0525   Asthma in adult 11/22/2019   Mixed restrictive and obstructive lung disease ([place]) 11/22/2019   Primary osteoarthritis of right knee 11/22/2019   Acute pain of left shoulder 09/05/2019   Chronic bilateral low back pain without sciatica 09/05/2019   OAB (overactive bladder) 08/08/2019   Insomnia 08/08/2019   History of melanoma 07/20/2017   History of basal cell carcinoma 07/20/2017   IFG (impaired fasting glucose) 07/17/2017   Macular degeneration    Chronic fatigue 08/06/2015   Gastroesophageal reflux disease without esophagitis 08/06/2015   Glaucoma 08/06/2015   Hiatal hernia 08/06/2015   Melanoma of right upper arm ([place]) 08/06/2015   Mixed hyperlipidemia 08/06/2015   Rupture of right biceps tendon 08/06/2015   Biceps tendonitis of both shoulders 08/06/2015   Kerin Perna, PTA 06/16/21 11:26 AM   [place], [place], [address] Phone: [phone]   Fax:  [phone]  Name: Anna [name] MRN: [id number] Date of Birth: 23-Sep-1936

## 2021-06-16 NOTE — Patient Instructions (Addendum)
Kinesiology tape What is kinesiology tape?  There are many brands of kinesiology tape.  KTape, [employer], [employer], Dynamic tape, to name a few. It is an elasticized tape designed to support the body's natural healing process. This tape provides stability and support to muscles and joints without restricting motion. It can also help decrease swelling in the area of application. How does it work? The tape microscopically lifts and decompresses the skin to allow for drainage of lymph (swelling) to flow away from area, reducing inflammation.  The tape has the ability to help re-educate the neuromuscular system by targeting specific receptors in the skin.  The presence of the tape increases the body's awareness of posture and body mechanics.  Do not use with: Open wounds Skin lesions Adhesive allergies Safe removal of the tape: In some rare cases, mild/moderate skin irritation can occur.  This can include redness, itchiness, or hives. If this occurs, immediately remove tape and consult your primary care physician if symptoms are severe or do not resolve within 2 days.  To remove tape safely, hold nearby skin with one hand and gentle roll tape down with other hand.  You can apply oil or conditioner to tape while in shower prior to removal to loosen adhesive.  DO NOT swiftly rip tape off like a band-aid, as this could cause skin tears and additional skin irritation.   Access Code: [email or web address] URL: [email or web address] Date: 06/16/2021 Prepared by: Frederick  Exercises Seated Cervical Retraction - 3 x daily - 7 x weekly - 10 reps - 1 sets Standing Scapular Retraction - 3 x daily - 7 x weekly - 10 reps - 1 sets - 10 hold Shoulder External Rotation and Scapular Retraction - 3 x daily - 7 x weekly - 1 sets - 10 reps - 3 hold Shoulder External Rotation in 45 Degrees Abduction - 2 x daily - 7 x weekly - 1-2 sets - 10 reps - 3 sec hold Doorway Pec Stretch at 60  Degrees Abduction - 3 x daily - 7 x weekly - 3 reps - 1 sets Doorway Pec Stretch at 90 Degrees Abduction - 3 x daily - 7 x weekly - 3 reps - 1 sets - 30 seconds hold Doorway Pec Stretch at 120 Degrees Abduction - 3 x daily - 7 x weekly - 3 reps - 1 sets - 30 second hold hold Supine Cervical Retraction with Towel - 2 x daily - 7 x weekly - 1 sets - 5-10 reps - 10 sec hold Shoulder External Rotation and Scapular Retraction with Resistance - 2 x daily - 7 x weekly - 10 reps - 3 sets Scapular Retraction with Resistance - 2 x daily - 7 x weekly - 10 reps - 3 sets Scapular Retraction with Resistance Advanced - 2 x daily - 7 x weekly - 10 reps - 3 sets Supine Piriformis Stretch with Leg Straight - 2 x daily - 7 x weekly - 1 sets - 3 reps - 30 sec hold Hooklying Isometric Clamshell - 2 x daily - 7 x weekly - 1 sets - 10 reps - 3 sec hold Sit to Stand - 2 x daily - 7 x weekly - 1 sets - 10 reps - 3-5 sec hold Wall Quarter Squat - 2 x daily - 7 x weekly - 1-2 sets - 10 reps - 5-10 sec hold Single Leg Stance with Support - 2 x daily - 7 x weekly - 1 sets - 3 reps -  10-30 sec hold Standing Hip Extension with Counter Support - 2 x daily - 7 x weekly - 1-2 sets - 10 reps - 3-5 sec hold Standing Hip Abduction with Counter Support - 2 x daily - 7 x weekly - 2-3 sets - 10 reps - 2-3 sec hold Seated Figure 4 Piriformis Stretch - 1 x daily - 7 x weekly - 1 sets - 2-3 reps - 15 seconds hold Seated Hamstring Stretch - 1 x daily - 7 x weekly - 1 sets - 2-3 reps - 15 seconds hold

## 2021-06-22 ENCOUNTER — Encounter: Payer: Medicare Other | Admitting: Rehabilitative and Restorative Service Providers"

## 2021-06-23 ENCOUNTER — Encounter: Payer: Medicare Other | Admitting: Rehabilitative and Restorative Service Providers"

## 2021-06-24 ENCOUNTER — Other Ambulatory Visit: Payer: Self-pay | Admitting: Family Medicine

## 2021-06-24 DIAGNOSIS — J4540 Moderate persistent asthma, uncomplicated: Secondary | ICD-10-CM

## 2021-06-25 ENCOUNTER — Encounter: Payer: Medicare Other | Admitting: Rehabilitative and Restorative Service Providers"

## 2021-06-30 ENCOUNTER — Other Ambulatory Visit: Payer: Self-pay

## 2021-06-30 ENCOUNTER — Ambulatory Visit: Payer: Medicare Other | Admitting: Rehabilitative and Restorative Service Providers"

## 2021-06-30 ENCOUNTER — Encounter: Payer: Self-pay | Admitting: Rehabilitative and Restorative Service Providers"

## 2021-06-30 DIAGNOSIS — G8929 Other chronic pain: Secondary | ICD-10-CM

## 2021-06-30 DIAGNOSIS — R531 Weakness: Secondary | ICD-10-CM | POA: Diagnosis not present

## 2021-06-30 DIAGNOSIS — M542 Cervicalgia: Secondary | ICD-10-CM

## 2021-06-30 DIAGNOSIS — M25561 Pain in right knee: Secondary | ICD-10-CM

## 2021-06-30 DIAGNOSIS — R29898 Other symptoms and signs involving the musculoskeletal system: Secondary | ICD-10-CM | POA: Diagnosis not present

## 2021-06-30 DIAGNOSIS — R293 Abnormal posture: Secondary | ICD-10-CM

## 2021-06-30 NOTE — Patient Instructions (Addendum)
  Access Code: [email or web address] URL: [email or web address] Date: 06/30/2021 Prepared by: Gillermo Murdoch  Exercises Seated Cervical Retraction - 3 x daily - 7 x weekly - 10 reps - 1 sets Standing Scapular Retraction - 3 x daily - 7 x weekly - 10 reps - 1 sets - 10 hold Shoulder External Rotation and Scapular Retraction - 3 x daily - 7 x weekly - 1 sets - 10 reps - 3 hold Shoulder External Rotation in 45 Degrees Abduction - 2 x daily - 7 x weekly - 1-2 sets - 10 reps - 3 sec hold Doorway Pec Stretch at 60 Degrees Abduction - 3 x daily - 7 x weekly - 3 reps - 1 sets Doorway Pec Stretch at 90 Degrees Abduction - 3 x daily - 7 x weekly - 3 reps - 1 sets - 30 seconds hold Doorway Pec Stretch at 120 Degrees Abduction - 3 x daily - 7 x weekly - 3 reps - 1 sets - 30 second hold hold Supine Cervical Retraction with Towel - 2 x daily - 7 x weekly - 1 sets - 5-10 reps - 10 sec hold Shoulder External Rotation and Scapular Retraction with Resistance - 2 x daily - 7 x weekly - 10 reps - 3 sets Scapular Retraction with Resistance - 2 x daily - 7 x weekly - 10 reps - 3 sets Scapular Retraction with Resistance Advanced - 2 x daily - 7 x weekly - 10 reps - 3 sets Supine Piriformis Stretch with Leg Straight - 2 x daily - 7 x weekly - 1 sets - 3 reps - 30 sec hold Hooklying Isometric Clamshell - 2 x daily - 7 x weekly - 1 sets - 10 reps - 3 sec hold Sit to Stand - 2 x daily - 7 x weekly - 1 sets - 10 reps - 3-5 sec hold Wall Quarter Squat - 2 x daily - 7 x weekly - 1-2 sets - 10 reps - 5-10 sec hold Single Leg Stance with Support - 2 x daily - 7 x weekly - 1 sets - 3 reps - 10-30 sec hold Standing Hip Extension with Counter Support - 2 x daily - 7 x weekly - 1-2 sets - 10 reps - 3-5 sec hold Standing Hip Abduction with Counter Support - 2 x daily - 7 x weekly - 2-3 sets - 10 reps - 2-3 sec hold Seated Hamstring Stretch - 1 x daily - 7 x weekly - 1 sets - 2-3 reps - 15 seconds hold Hooklying Hamstring Stretch  with Strap - 2 x daily - 7 x weekly - 1 sets - 3 reps - 30 sec hold Supine ITB Stretch with Strap - 2 x daily - 7 x weekly - 1 sets - 3 reps - 30 sec hold

## 2021-06-30 NOTE — Therapy (Signed)
[place], [place], [address] Phone: [phone]   Fax:  [phone]  Physical Therapy Treatment  Patient Details  Name: Anna [name] MRN: [id number] Date of Birth: Jun 18, 1936 Referring Provider (PT): Dr Dianah Field   Encounter Date: 06/30/2021   PT End of Session - 06/30/21 1145     Visit Number 8    Number of Visits 16    Date for PT Re-Evaluation 07/16/21    PT Start Time 0160    PT Stop Time 1093    PT Time Calculation (min) 49 min    Activity Tolerance Patient tolerated treatment well;No increased pain             Past Medical History:  Diagnosis Date   Dermatitis    seborrehic scalp   Hiatal hernia    History of basal cell carcinoma 07/20/2017   On nose.    History of melanoma 07/20/2017   Right upper arm    Hyperlipidemia    Macular degeneration     Past Surgical History:  Procedure Laterality Date   BREAST CYST EXCISION Right 09/1962   CATARACT EXTRACTION  03/2015   melonoma Right 06/2016   arm   MOHS SURGERY Left 06/2016   nose    There were no vitals filed for this visit.   Subjective Assessment - 06/30/21 1146     Subjective Patient reports that he neck is about 98% better. She is continuing to have painin the Rt LE with tightness and pain in the back of the Rt leg and knee as well as pain in the side of the knee. She has not had time to do her LE exercises - has had a death in her family and has been busy. Sees Dr T 07/13/21 and will ask him about a shot for the knee.    Currently in Pain? Yes    Pain Score 5     Pain Location Knee    Pain Orientation Right    Pain Descriptors / Indicators Tightness;Aching    Pain Type Chronic pain    Pain Radiating Towards back of the knee    Pain Onset More than a month ago    Pain Frequency Constant    Aggravating Factors  walking; sitting on some surfaces - usually that put pressure on the back of the leg    Pain  Relieving Factors does not know                [facility] PT Assessment - 06/30/21 0001       Assessment   Medical Diagnosis Lt cervical dysfunction; OA Rt > Lt knees    Referring Provider (PT) Dr Dianah Field    Onset Date/Surgical Date 01/24/21    Hand Dominance Right    Next MD Visit 07/13/21    Prior Therapy treatment for Lt shoulder and LBP here      AROM   Cervical Flexion 48    Cervical Extension 48    Cervical - Right Side Bend 23    Cervical - Left Side Bend 24    Cervical - Right Rotation 48    Cervical - Left Rotation 48      Palpation   Palpation comment muscular tightness Rt hamstrings; Rt lateral knee at insertion of ITB; Lt suboccipital area; Lt > Rt ant/lat/posterior cervical musculature; pecs; upper traps; thoracic paraspinals  [facility] Adult PT Treatment/Exercise - 06/30/21 0001       Neck Exercises: Supine   Neck Retraction 5 reps;5 secs;10 secs      Knee/Hip Exercises: Stretches   Active Hamstring Stretch Right;2 reps;30 seconds    Active Hamstring Stretch Limitations seated    Passive Hamstring Stretch Left;2 reps;30 seconds   supine with strap   Piriformis Stretch Limitations hold due to HS/knee pain      Knee/Hip Exercises: Aerobic   Nustep L5: 5 min UE 10      Moist Heat Therapy   Number Minutes Moist Heat 10 Minutes    Moist Heat Location Cervical;Knee;Hip   posterior thigh     Manual Therapy   Manual therapy comments skilled palpation to assess response to DN and manual work    Soft tissue mobilization deep tissue work and IASTM Lt lat/posterior cervical musculature; Rt HS/ITB    Myofascial Release Rt hamstrings              Trigger Point Dry Needling - 06/30/21 0001     Consent Given? Yes    Education Handout Provided Previously provided    Dry Needling Comments Lt/Rt    Electrical Stimulation Performed with Dry Needling Yes    E-stim with Dry Needling Details hamstrings    Scalenes  Response Palpable increased muscle length    Cervical multifidi Response Palpable increased muscle length    Hamstring Response Palpable increased muscle length;Twitch response elicited    Tensor Fascia Lata Response Palpable increased muscle length;Twitch response elicited   distal at lateral knee                  PT Education - 06/30/21 1236     Education Details HEP    Person(s) Educated Patient    Methods Explanation;Demonstration;Tactile cues;Verbal cues;Handout    Comprehension Verbalized understanding;Returned demonstration;Verbal cues required;Tactile cues required                 PT Long Term Goals - 06/16/21 1123       PT LONG TERM GOAL #1   Title Improve posture and alignment with patient ot demonstrated improved upright posture with postreior shoulder girdle engaged    Time 6    Period Weeks    Status On-going      PT LONG TERM GOAL #2   Title Increase strength bilat LE's allowing patient to return to normal functional activities without "buckling in knee"    Time 6    Period Weeks    Status On-going      PT LONG TERM GOAL #3   Title Increase strength bilat hips to 4+/5 to 5/5    Time 6    Period Weeks    Status On-going      PT LONG TERM GOAL #4   Title Independent in HEP    Time 6    Period Weeks    Status On-going      PT LONG TERM GOAL #5   Title Improve functional limitation score to 60    Baseline (neck - 65)    Time 6    Period Weeks    Status Achieved                   Plan - 06/30/21 1213     Clinical Impression Statement Good response to cervical DN and manual work. Less palpable tightness; improving ROM; minimal pain. Patient has significant pain in the Rt LE with muscular tightness in  the hamstring and ITB. Trial of DN, manual work and stretching for Rt hamstrings and ITB with good resolution of LE pain. Patient will work on stretching at home to decrease pain and then resume strengthening exercises.    Rehab  Potential Good    PT Frequency 2x / week    PT Duration 6 weeks    PT Treatment/Interventions ADLs/Self Care Home Management;Aquatic Therapy;Cryotherapy;Electrical Stimulation;Iontophoresis 4mg /ml Dexamethasone;Moist Heat;Ultrasound;Therapeutic activities;Therapeutic exercise;Balance training;Neuromuscular re-education;Patient/family education;Manual techniques;Dry needling;Taping    PT Next Visit Plan Assesss response to DN, manual work and stretching to Rt HS and ITB; progress with LE strengthening as LE pain subsides; progress with posterior shoulder girdle strengthening and cervical mobility; manual work through anterior chest/pecs; upper trap; ant/lat/posterior cervical musculature as indicated; modalities as indicated; continue DN bilat occipital area and cervical musculature; Rt LE; aquatic therapy but it is difficult to arrange transportation    PT Home Exercise Plan [email or web address]    Consulted and Agree with Plan of Care Patient             Patient will benefit from skilled therapeutic intervention in order to improve the following deficits and impairments:     Visit Diagnosis: Chronic pain of right knee  Abnormal posture  Other symptoms and signs involving the musculoskeletal system  Cervicalgia  Weakness generalized     Problem List Patient Active Problem List   Diagnosis Date Noted   DDD (degenerative disc disease), cervical 05/04/2021   Neck pain 05/03/2021   Anorexia 45/11/8880   Umbilical hernia without obstruction and without gangrene 06/30/2020   Meralgia paresthetica of right side 06/22/2020   Venous insufficiency of both lower extremities 06/22/2020   Primary osteoarthritis, left shoulder 06/03/2020   Bilateral lower extremity edema 05/25/2020   Aortic atherosclerosis ([place]) 05/13/2020   Bunion, right foot 80/11/4915   Lichen planus 91/50/5697   Asthma in adult 11/22/2019   Mixed restrictive and obstructive lung disease ([place]) 11/22/2019   Primary  osteoarthritis of right knee 11/22/2019   Acute pain of left shoulder 09/05/2019   Chronic bilateral low back pain without sciatica 09/05/2019   OAB (overactive bladder) 08/08/2019   Insomnia 08/08/2019   History of melanoma 07/20/2017   History of basal cell carcinoma 07/20/2017   IFG (impaired fasting glucose) 07/17/2017   Macular degeneration    Chronic fatigue 08/06/2015   Gastroesophageal reflux disease without esophagitis 08/06/2015   Glaucoma 08/06/2015   Hiatal hernia 08/06/2015   Melanoma of right upper arm ([place]) 08/06/2015   Mixed hyperlipidemia 08/06/2015   Rupture of right biceps tendon 08/06/2015   Biceps tendonitis of both shoulders 08/06/2015    Raliegh Scobie Nilda Simmer, PT, MPH  06/30/2021, 1:01 PM  [facility] [place] [address] [place], [place], [address] Phone: [phone]   Fax:  [phone]  Name: Anna [name] MRN: [id number] Date of Birth: 12/11/1935

## 2021-07-02 ENCOUNTER — Encounter: Payer: Self-pay | Admitting: Rehabilitative and Restorative Service Providers"

## 2021-07-02 ENCOUNTER — Ambulatory Visit: Payer: Medicare Other | Admitting: Rehabilitative and Restorative Service Providers"

## 2021-07-02 ENCOUNTER — Other Ambulatory Visit: Payer: Self-pay

## 2021-07-02 DIAGNOSIS — R531 Weakness: Secondary | ICD-10-CM

## 2021-07-02 DIAGNOSIS — M542 Cervicalgia: Secondary | ICD-10-CM | POA: Diagnosis not present

## 2021-07-02 DIAGNOSIS — G8929 Other chronic pain: Secondary | ICD-10-CM

## 2021-07-02 DIAGNOSIS — R293 Abnormal posture: Secondary | ICD-10-CM | POA: Diagnosis not present

## 2021-07-02 DIAGNOSIS — M25561 Pain in right knee: Secondary | ICD-10-CM

## 2021-07-02 DIAGNOSIS — R29898 Other symptoms and signs involving the musculoskeletal system: Secondary | ICD-10-CM | POA: Diagnosis not present

## 2021-07-02 NOTE — Therapy (Signed)
[place], [place], [address] Phone: [phone]   Fax:  [phone]  Physical Therapy Treatment  Patient Details  Name: Anna [name] MRN: [id number] Date of Birth: 1936-09-02 Referring Provider (PT): Dr Dianah Field   Encounter Date: 07/02/2021   PT End of Session - 07/02/21 1323     Visit Number 9    Number of Visits 16    Date for PT Re-Evaluation 07/16/21    PT Start Time 6195    PT Stop Time 1435    PT Time Calculation (min) 48 min    Activity Tolerance Patient tolerated treatment well;No increased pain             Past Medical History:  Diagnosis Date   Dermatitis    seborrehic scalp   Hiatal hernia    History of basal cell carcinoma 07/20/2017   On nose.    History of melanoma 07/20/2017   Right upper arm    Hyperlipidemia    Macular degeneration     Past Surgical History:  Procedure Laterality Date   BREAST CYST EXCISION Right 09/1962   CATARACT EXTRACTION  03/2015   melonoma Right 06/2016   arm   MOHS SURGERY Left 06/2016   nose    There were no vitals filed for this visit.   Subjective Assessment - 07/02/21 1324     Subjective Patient reports that she now knows what has caused the Rt knee pain - it is pulling up with the legs when moving the recliner out of the reclined position to upright.She is looking at new chairs to help with the moving to get out of the chair. Good improvement in the Rt leg after DN and manual work at last visit.    Currently in Pain? Yes    Pain Score 5     Pain Location Knee    Pain Orientation Right    Pain Descriptors / Indicators Tightness;Aching    Pain Type Chronic pain                               [facility] Adult PT Treatment/Exercise - 07/02/21 0001       Neck Exercises: Supine   Neck Retraction 5 reps;5 secs;10 secs      Knee/Hip Exercises: Stretches   Active Hamstring Stretch Right;2 reps;30 seconds     Active Hamstring Stretch Limitations seated    Passive Hamstring Stretch Left;2 reps;30 seconds   supine with strap   ITB Stretch Right;2 reps;30 seconds   supine with strap     Knee/Hip Exercises: Aerobic   Nustep L5: 6 min UE 10      Moist Heat Therapy   Number Minutes Moist Heat 10 Minutes    Moist Heat Location Cervical;Knee;Hip   posterior thigh     Manual Therapy   Manual therapy comments skilled palpation to assess response to DN and manual work    Soft tissue mobilization deep tissue work and IASTM Rt lateral HS/ITB/peroneals    Myofascial Release lateral thigh/knee/calf              Trigger Point Dry Needling - 07/02/21 0001     Consent Given? Yes    Education Handout Provided Previously provided    Dry Needling Comments Rt    Electrical Stimulation Performed with Dry Needling Yes    Hamstring Response Palpable increased muscle length;Twitch response elicited    Tensor  Fascia Lata Response Palpable increased muscle length;Twitch response elicited                        PT Long Term Goals - 06/16/21 1123       PT LONG TERM GOAL #1   Title Improve posture and alignment with patient ot demonstrated improved upright posture with postreior shoulder girdle engaged    Time 6    Period Weeks    Status On-going      PT LONG TERM GOAL #2   Title Increase strength bilat LE's allowing patient to return to normal functional activities without "buckling in knee"    Time 6    Period Weeks    Status On-going      PT LONG TERM GOAL #3   Title Increase strength bilat hips to 4+/5 to 5/5    Time 6    Period Weeks    Status On-going      PT LONG TERM GOAL #4   Title Independent in HEP    Time 6    Period Weeks    Status On-going      PT LONG TERM GOAL #5   Title Improve functional limitation score to 60    Baseline (neck - 65)    Time 6    Period Weeks    Status Achieved                   Plan - 07/02/21 1328     Clinical  Impression Statement Good response to DN and manual work through the posterior Rt LE at last visit. Patient has identified what has caused the increased pain in the Rt LE - which is sitting in her recliner and using the Rt LE to pull chair to upright position. Discussed modification for sitting position and qualities to look for in a chair if she purchases a new chair.    Rehab Potential Good    PT Frequency 2x / week    PT Duration 6 weeks    PT Treatment/Interventions ADLs/Self Care Home Management;Aquatic Therapy;Cryotherapy;Electrical Stimulation;Iontophoresis 4mg /ml Dexamethasone;Moist Heat;Ultrasound;Therapeutic activities;Therapeutic exercise;Balance training;Neuromuscular re-education;Patient/family education;Manual techniques;Dry needling;Taping    PT Next Visit Plan Assesss response to DN, manual work and stretching to Rt HS and ITB; progress with LE strengthening as LE pain subsides; progress with posterior shoulder girdle strengthening and cervical mobility; manual work through anterior chest/pecs; upper trap; ant/lat/posterior cervical musculature as indicated; modalities as indicated; continue DN bilat occipital area and cervical musculature; Rt LE; aquatic therapy but it is difficult to arrange transportation    PT Home Exercise Plan [email or web address]    Consulted and Agree with Plan of Care Patient             Patient will benefit from skilled therapeutic intervention in order to improve the following deficits and impairments:     Visit Diagnosis: Chronic pain of right knee  Abnormal posture  Other symptoms and signs involving the musculoskeletal system  Cervicalgia  Weakness generalized     Problem List Patient Active Problem List   Diagnosis Date Noted   DDD (degenerative disc disease), cervical 05/04/2021   Neck pain 05/03/2021   Anorexia 53/97/6734   Umbilical hernia without obstruction and without gangrene 06/30/2020   Meralgia paresthetica of right side 06/22/2020    Venous insufficiency of both lower extremities 06/22/2020   Primary osteoarthritis, left shoulder 06/03/2020   Bilateral lower extremity edema 05/25/2020   Aortic atherosclerosis ([place]) 05/13/2020  Bunion, right foot 83/41/9622   Lichen planus 29/79/8921   Asthma in adult 11/22/2019   Mixed restrictive and obstructive lung disease ([place]) 11/22/2019   Primary osteoarthritis of right knee 11/22/2019   Acute pain of left shoulder 09/05/2019   Chronic bilateral low back pain without sciatica 09/05/2019   OAB (overactive bladder) 08/08/2019   Insomnia 08/08/2019   History of melanoma 07/20/2017   History of basal cell carcinoma 07/20/2017   IFG (impaired fasting glucose) 07/17/2017   Macular degeneration    Chronic fatigue 08/06/2015   Gastroesophageal reflux disease without esophagitis 08/06/2015   Glaucoma 08/06/2015   Hiatal hernia 08/06/2015   Melanoma of right upper arm ([place]) 08/06/2015   Mixed hyperlipidemia 08/06/2015   Rupture of right biceps tendon 08/06/2015   Biceps tendonitis of both shoulders 08/06/2015    Teara Duerksen Nilda Simmer, PT, MPH  07/02/2021, 2:06 PM  [facility] [place], [place], [address] Phone: [phone]   Fax:  [phone]  Name: Kristiane Morsch MRN: [id number] Date of Birth: 1935-10-06

## 2021-07-07 ENCOUNTER — Other Ambulatory Visit: Payer: Self-pay

## 2021-07-07 ENCOUNTER — Other Ambulatory Visit: Payer: Self-pay | Admitting: Family Medicine

## 2021-07-07 ENCOUNTER — Encounter: Payer: Self-pay | Admitting: Rehabilitative and Restorative Service Providers"

## 2021-07-07 ENCOUNTER — Ambulatory Visit: Payer: Medicare Other | Admitting: Rehabilitative and Restorative Service Providers"

## 2021-07-07 DIAGNOSIS — G8929 Other chronic pain: Secondary | ICD-10-CM

## 2021-07-07 DIAGNOSIS — M436 Torticollis: Secondary | ICD-10-CM | POA: Diagnosis not present

## 2021-07-07 DIAGNOSIS — R29898 Other symptoms and signs involving the musculoskeletal system: Secondary | ICD-10-CM

## 2021-07-07 DIAGNOSIS — M542 Cervicalgia: Secondary | ICD-10-CM | POA: Diagnosis not present

## 2021-07-07 DIAGNOSIS — R531 Weakness: Secondary | ICD-10-CM

## 2021-07-07 DIAGNOSIS — M25561 Pain in right knee: Secondary | ICD-10-CM | POA: Diagnosis not present

## 2021-07-07 DIAGNOSIS — R293 Abnormal posture: Secondary | ICD-10-CM

## 2021-07-07 NOTE — Patient Instructions (Signed)
Access Code: [email or web address] URL: [email or web address] Date: 07/07/2021 Prepared by: Gillermo Murdoch  Exercises Seated Cervical Retraction - 3 x daily - 7 x weekly - 10 reps - 1 sets Standing Scapular Retraction - 3 x daily - 7 x weekly - 10 reps - 1 sets - 10 hold Shoulder External Rotation and Scapular Retraction - 3 x daily - 7 x weekly - 1 sets - 10 reps - 3 hold Shoulder External Rotation in 45 Degrees Abduction - 2 x daily - 7 x weekly - 1-2 sets - 10 reps - 3 sec hold Doorway Pec Stretch at 60 Degrees Abduction - 3 x daily - 7 x weekly - 3 reps - 1 sets Doorway Pec Stretch at 90 Degrees Abduction - 3 x daily - 7 x weekly - 3 reps - 1 sets - 30 seconds hold Doorway Pec Stretch at 120 Degrees Abduction - 3 x daily - 7 x weekly - 3 reps - 1 sets - 30 second hold hold Supine Cervical Retraction with Towel - 2 x daily - 7 x weekly - 1 sets - 5-10 reps - 10 sec hold Shoulder External Rotation and Scapular Retraction with Resistance - 2 x daily - 7 x weekly - 10 reps - 3 sets Scapular Retraction with Resistance - 2 x daily - 7 x weekly - 10 reps - 3 sets Scapular Retraction with Resistance Advanced - 2 x daily - 7 x weekly - 10 reps - 3 sets Supine Piriformis Stretch with Leg Straight - 2 x daily - 7 x weekly - 1 sets - 3 reps - 30 sec hold Hooklying Isometric Clamshell - 2 x daily - 7 x weekly - 1 sets - 10 reps - 3 sec hold Sit to Stand - 2 x daily - 7 x weekly - 1 sets - 10 reps - 3-5 sec hold Wall Quarter Squat - 2 x daily - 7 x weekly - 1-2 sets - 10 reps - 5-10 sec hold Single Leg Stance with Support - 2 x daily - 7 x weekly - 1 sets - 3 reps - 10-30 sec hold Standing Hip Extension with Counter Support - 2 x daily - 7 x weekly - 1-2 sets - 10 reps - 3-5 sec hold Standing Hip Abduction with Counter Support - 2 x daily - 7 x weekly - 2-3 sets - 10 reps - 2-3 sec hold Seated Hamstring Stretch - 1 x daily - 7 x weekly - 1 sets - 2-3 reps - 15 seconds hold Hooklying Hamstring Stretch  with Strap - 2 x daily - 7 x weekly - 1 sets - 3 reps - 30 sec hold Supine ITB Stretch with Strap - 2 x daily - 7 x weekly - 1 sets - 3 reps - 30 sec hold Seated Shoulder W - 2 x daily - 7 x weekly - 1 sets - 10 reps - 3 sec hold Sidestepping - 2 x daily - 7 x weekly

## 2021-07-07 NOTE — Therapy (Signed)
[place], [place], [address] Phone: [phone]   Fax:  [phone]  Physical Therapy Treatment 10th visit Medicare Note Progress Note Reporting Period 05/06/21 to 07/07/21  See note below for Objective Data and Assessment of Progress/Goals.        Patient Details  Name: Anna [name] MRN: [id number] Date of Birth: January 11, 1936 Referring Provider (PT): Dr Dianah Field   Encounter Date: 07/07/2021   PT End of Session - 07/07/21 1010     Visit Number 10    Number of Visits 16    Date for PT Re-Evaluation 07/16/21    PT Start Time 1010    PT Stop Time 1100    PT Time Calculation (min) 50 min    Activity Tolerance Patient tolerated treatment well;No increased pain             Past Medical History:  Diagnosis Date   Dermatitis    seborrehic scalp   Hiatal hernia    History of basal cell carcinoma 07/20/2017   On nose.    History of melanoma 07/20/2017   Right upper arm    Hyperlipidemia    Macular degeneration     Past Surgical History:  Procedure Laterality Date   BREAST CYST EXCISION Right 09/1962   CATARACT EXTRACTION  03/2015   melonoma Right 06/2016   arm   MOHS SURGERY Left 06/2016   nose    There were no vitals filed for this visit.   Subjective Assessment - 07/07/21 1011     Subjective Feeling a lot better. Less pain and tightness in the Rt leg. No pain in the Rt LE. Still having some pain in the Lt neck area - tightness and discomroft at times - mostly when she goes to bed at night.    Currently in Pain? No/denies    Pain Score 0-No pain    Pain Location Knee    Pain Orientation Right                               [facility] Adult PT Treatment/Exercise - 07/07/21 0001       Knee/Hip Exercises: Aerobic   Nustep L5: 6 min UE 10      Knee/Hip Exercises: Standing   Hip Abduction Stengthening;Right;10 reps;Left;Knee straight    Hip Extension  Stengthening;Right;Left;10 reps;Knee straight    Lateral Step Up Limitations trial of lateral step up to Rt pt experienced pain and difficuty with 2 reps - stopped    Wall Squat 10 reps;5 seconds    SLS 10 sec x 3 reps each LE; UE support as needed for balance    Other Standing Knee Exercises lateral walking 10 ft x 4 reps each direction      Shoulder Exercises: Standing   Other Standing Exercises step back holding UE's in row position at side blue TB 5 sec hold x 8 reps    Other Standing Exercises W  bilat 3 sec hold x 10 reps      Shoulder Exercises: Stretch   Other Shoulder Stretches doorway stretch x 3 positions  (all three slightly lower) 15 sec x 2 reps      Moist Heat Therapy   Number Minutes Moist Heat 10 Minutes    Moist Heat Location Cervical;Knee;Hip   posterior thigh     Manual Therapy   Manual therapy comments skilled palpation to assess response to DN and manual  work    Soft tissue mobilization deep tissue work [employer] [place] Dry Needling - 07/07/21 0001     Consent Given? Yes    Education Handout Provided Yes    Dry Needling Comments Lt    Cervical multifidi Response Palpable increased muscle length;Twitch reponse elicited                   PT Education - 07/07/21 1046     Education Details HEP    Person(s) Educated Patient    Methods Explanation;Demonstration;Tactile cues;Verbal cues;Handout    Comprehension Verbalized understanding;Returned demonstration;Verbal cues required;Tactile cues required                 PT Long Term Goals - 07/07/21 1017       PT LONG TERM GOAL #1   Title Improve posture and alignment with patient ot demonstrated improved upright posture with postreior shoulder girdle engaged    Time 6    Period Weeks    Status On-going    Target Date 07/16/21      PT LONG TERM GOAL #2   Title Increase strength bilat LE's allowing patient to return to normal functional activities  without "buckling in knee"    Time 6    Period Weeks    Status On-going    Target Date 07/16/21      PT LONG TERM GOAL #3   Title Increase strength bilat hips to 4+/5 to 5/5    Time 6    Period Weeks    Status On-going    Target Date 07/16/21      PT LONG TERM GOAL #4   Title Independent in HEP    Time 6    Status On-going    Target Date 07/16/21      PT LONG TERM GOAL #5   Title Improve functional limitation score to 60    Time 6    Period Weeks    Status On-going    Target Date 07/16/21                   Plan - 07/07/21 1015     Clinical Impression Statement Excellent progress with good resolution of Rt LE pain. Will benefit from progression of strengthening exercises for LE's. Continued tightness and discomfort in the Lt cervical spine area. Will benefit from continued treatment including DN as indicated. Progressing well toward stated goals of therapy. Will benefit from continued PT to reach maximum rehab potential.    Rehab Potential Good    PT Frequency 2x / week    PT Duration 6 weeks    PT Treatment/Interventions ADLs/Self Care Home Management;Aquatic Therapy;Cryotherapy;Electrical Stimulation;Iontophoresis 4mg /ml Dexamethasone;Moist Heat;Ultrasound;Therapeutic activities;Therapeutic exercise;Balance training;Neuromuscular re-education;Patient/family education;Manual techniques;Dry needling;Taping    PT Next Visit Plan Assesss response to DN, manual work and stretching to Rt HS and ITB; progress with LE strengthening; progress with posterior shoulder girdle strengthening and cervical mobility; manual work through anterior chest/pecs; upper trap; ant/lat/posterior cervical musculature as indicated; modalities as indicated; continue DN bilat occipital area and cervical musculature; Rt LE; aquatic therapy but it is difficult to arrange transportation    PT [place] and Agree with Plan of Care Patient             Patient will  benefit from skilled therapeutic intervention in order to improve the following deficits and impairments:  Visit Diagnosis: Chronic pain of right knee  Abnormal posture  Other symptoms and signs involving the musculoskeletal system  Cervicalgia  Weakness generalized  Stiffness of cervical spine     Problem List Patient Active Problem List   Diagnosis Date Noted   DDD (degenerative disc disease), cervical 05/04/2021   Neck pain 05/03/2021   Anorexia 51/83/4373   Umbilical hernia without obstruction and without gangrene 06/30/2020   Meralgia paresthetica of right side 06/22/2020   Venous insufficiency of both lower extremities 06/22/2020   Primary osteoarthritis, left shoulder 06/03/2020   Bilateral lower extremity edema 05/25/2020   Aortic atherosclerosis ([place]) 05/13/2020   Bunion, right foot 57/89/7847   Lichen planus 84/08/8207   Asthma in adult 11/22/2019   Mixed restrictive and obstructive lung disease ([place]) 11/22/2019   Primary osteoarthritis of right knee 11/22/2019   Acute pain of left shoulder 09/05/2019   Chronic bilateral low back pain without sciatica 09/05/2019   OAB (overactive bladder) 08/08/2019   Insomnia 08/08/2019   History of melanoma 07/20/2017   History of basal cell carcinoma 07/20/2017   IFG (impaired fasting glucose) 07/17/2017   Macular degeneration    Chronic fatigue 08/06/2015   Gastroesophageal reflux disease without esophagitis 08/06/2015   Glaucoma 08/06/2015   Hiatal hernia 08/06/2015   Melanoma of right upper arm ([place]) 08/06/2015   Mixed hyperlipidemia 08/06/2015   Rupture of right biceps tendon 08/06/2015   Biceps tendonitis of both shoulders 08/06/2015    Jalaina Salyers Nilda Simmer, PT, MPH  07/07/2021, 11:00 AM  [facility] [address] [place], [place], [address] Phone: [phone]   Fax:  [phone]  Name: Anna [name] MRN: [id number] Date of Birth: [birth year]

## 2021-07-09 ENCOUNTER — Other Ambulatory Visit: Payer: Self-pay

## 2021-07-09 ENCOUNTER — Ambulatory Visit: Payer: Medicare Other | Admitting: Rehabilitative and Restorative Service Providers"

## 2021-07-09 ENCOUNTER — Encounter: Payer: Self-pay | Admitting: Rehabilitative and Restorative Service Providers"

## 2021-07-09 DIAGNOSIS — G8929 Other chronic pain: Secondary | ICD-10-CM

## 2021-07-09 DIAGNOSIS — R531 Weakness: Secondary | ICD-10-CM | POA: Diagnosis not present

## 2021-07-09 DIAGNOSIS — R293 Abnormal posture: Secondary | ICD-10-CM

## 2021-07-09 DIAGNOSIS — M25561 Pain in right knee: Secondary | ICD-10-CM | POA: Diagnosis not present

## 2021-07-09 DIAGNOSIS — R29898 Other symptoms and signs involving the musculoskeletal system: Secondary | ICD-10-CM

## 2021-07-09 DIAGNOSIS — M542 Cervicalgia: Secondary | ICD-10-CM

## 2021-07-09 NOTE — Therapy (Signed)
[place], [place], [address] Phone: [phone]   Fax:  [phone]  Physical Therapy Treatment  Patient Details  Name: Anna [name] MRN: [id number] Date of Birth: [birth year] Referring Provider (PT): Dr Dianah Field   Encounter Date: 07/09/2021   PT End of Session - 07/09/21 1024     Visit Number 11    Number of Visits 16    Date for PT Re-Evaluation 07/16/21    PT Start Time 1018    PT Stop Time 1106    PT Time Calculation (min) 48 min    Activity Tolerance Patient tolerated treatment well             Past Medical History:  Diagnosis Date   Dermatitis    seborrehic scalp   Hiatal hernia    History of basal cell carcinoma 07/20/2017   On nose.    History of melanoma 07/20/2017   Right upper arm    Hyperlipidemia    Macular degeneration     Past Surgical History:  Procedure Laterality Date   BREAST CYST EXCISION Right 09/1962   CATARACT EXTRACTION  03/2015   melonoma Right 06/2016   arm   MOHS SURGERY Left 06/2016   nose    There were no vitals filed for this visit.   Subjective Assessment - 07/09/21 1025     Subjective Improving. Leg is feeling stronger. Pain in the back of the Rt leg has resloved. Still has a tiny bit of pain in the Lt neck but not as much as it was.    Currently in Pain? Yes    Pain Location Neck    Pain Orientation Left    Pain Type Chronic pain    Pain Radiating Towards none    Pain Onset More than a month ago    Pain Frequency Intermittent    Aggravating Factors  turning head    Pain Relieving Factors treatment - DN, manual work, stretching                               [facility] Adult PT Treatment/Exercise - 07/09/21 0001       Knee/Hip Exercises: Aerobic   Nustep L5: 5 min UE 10      Knee/Hip Exercises: Standing   Hip Abduction Stengthening;Right;10 reps;Left;Knee straight    Hip Extension Stengthening;Right;Left;10  reps;Knee straight    Forward Step Up Right;Left;10 reps;Hand Hold: 2;Step Height: 4"    Wall Squat 10 reps;5 seconds    SLS 10 sec x 3 reps each LE; UE support as needed for balance    Other Standing Knee Exercises lateral walking 10 ft x 4 reps each direction      Shoulder Exercises: Standing   Other Standing Exercises step back holding UE's in row position at side blue TB 5 sec hold x 8 reps    Other Standing Exercises W  bilat 3 sec hold x 10 reps      Shoulder Exercises: Stretch   Other Shoulder Stretches doorway stretch x 3 positions  (all three slightly lower) 15 sec x 2 reps      Moist Heat Therapy   Number Minutes Moist Heat 10 Minutes    Moist Heat Location Cervical      Manual Therapy   Soft tissue mobilization IASTM/manual deep tissue work Lt>Rt cervical musculatue  PT Education - 07/09/21 1041     Education Details HEP    Person(s) Educated Patient    Methods Explanation;Demonstration;Tactile cues;Verbal cues;Handout    Comprehension Verbalized understanding;Returned demonstration;Verbal cues required;Tactile cues required                 PT Long Term Goals - 07/07/21 1017       PT LONG TERM GOAL #1   Title Improve posture and alignment with patient ot demonstrated improved upright posture with postreior shoulder girdle engaged    Time 6    Period Weeks    Status On-going    Target Date 07/16/21      PT LONG TERM GOAL #2   Title Increase strength bilat LE's allowing patient to return to normal functional activities without "buckling in knee"    Time 6    Period Weeks    Status On-going    Target Date 07/16/21      PT LONG TERM GOAL #3   Title Increase strength bilat hips to 4+/5 to 5/5    Time 6    Period Weeks    Status On-going    Target Date 07/16/21      PT LONG TERM GOAL #4   Title Independent in HEP    Time 6    Status On-going    Target Date 07/16/21      PT LONG TERM GOAL #5   Title Improve  functional limitation score to 60    Time 6    Period Weeks    Status On-going    Target Date 07/16/21                   Plan - 07/09/21 1027     Clinical Impression Statement Full resolution of Rt knee pain and discomfort. Working on exercises at home. Still having some mild pain and tightness Lt cervical area. Appointment Monday and sees Dr T Tuesday nest week.    Rehab Potential Good    PT Frequency 2x / week    PT Duration 6 weeks    PT Treatment/Interventions ADLs/Self Care Home Management;Aquatic Therapy;Cryotherapy;Electrical Stimulation;Iontophoresis 4mg /ml Dexamethasone;Moist Heat;Ultrasound;Therapeutic activities;Therapeutic exercise;Balance training;Neuromuscular re-education;Patient/family education;Manual techniques;Dry needling;Taping    PT Next Visit Plan continue DN, manual work and stretching to Lt cervical musculature, Rt HS and ITB as indicated; progress with LE strengthening; progress with posterior shoulder girdle strengthening and cervical mobility; manual work through anterior chest/pecs; upper trap; ant/lat/posterior cervical musculature as indicated; modalities as indicated; continue DN bilat occipital area and cervical musculature; Rt LE; aquatic therapy but it is difficult to arrange transportation    PT Home Exercise Plan [email or web address]    Consulted and Agree with Plan of Care Patient             Patient will benefit from skilled therapeutic intervention in order to improve the following deficits and impairments:     Visit Diagnosis: Chronic pain of right knee  Abnormal posture  Other symptoms and signs involving the musculoskeletal system  Cervicalgia  Weakness generalized     Problem List Patient Active Problem List   Diagnosis Date Noted   DDD (degenerative disc disease), cervical 05/04/2021   Neck pain 05/03/2021   Anorexia 34/19/3790   Umbilical hernia without obstruction and without gangrene 06/30/2020   Meralgia paresthetica of  right side 06/22/2020   Venous insufficiency of both lower extremities 06/22/2020   Primary osteoarthritis, left shoulder 06/03/2020   Bilateral lower extremity edema 05/25/2020   Aortic  atherosclerosis ([place]) 05/13/2020   Bunion, right foot 56/43/3295   Lichen planus 18/84/1660   Asthma in adult 11/22/2019   Mixed restrictive and obstructive lung disease ([place]) 11/22/2019   Primary osteoarthritis of right knee 11/22/2019   Acute pain of left shoulder 09/05/2019   Chronic bilateral low back pain without sciatica 09/05/2019   OAB (overactive bladder) 08/08/2019   Insomnia 08/08/2019   History of melanoma 07/20/2017   History of basal cell carcinoma 07/20/2017   IFG (impaired fasting glucose) 07/17/2017   Macular degeneration    Chronic fatigue 08/06/2015   Gastroesophageal reflux disease without esophagitis 08/06/2015   Glaucoma 08/06/2015   Hiatal hernia 08/06/2015   Melanoma of right upper arm ([place]) 08/06/2015   Mixed hyperlipidemia 08/06/2015   Rupture of right biceps tendon 08/06/2015   Biceps tendonitis of both shoulders 08/06/2015    Daymein Nunnery Nilda Simmer, PT, MPH  07/09/2021, 10:58 AM  [facility] [place] [address] [place], [place], [address] Phone: [phone]   Fax:  [phone]  Name: Anna [name] MRN: [id number] Date of Birth: 06/25/1936

## 2021-07-09 NOTE — Patient Instructions (Signed)
Access Code: [email or web address] URL: [email or web address] Date: 07/09/2021 Prepared by: Gillermo Murdoch  Exercises Seated Cervical Retraction - 3 x daily - 7 x weekly - 1 sets - 10 reps Standing Scapular Retraction - 3 x daily - 7 x weekly - 1 sets - 10 reps - 10 hold Shoulder External Rotation and Scapular Retraction - 3 x daily - 7 x weekly - 1 sets - 10 reps - 3 hold Shoulder External Rotation in 45 Degrees Abduction - 2 x daily - 7 x weekly - 1-2 sets - 10 reps - 3 sec hold Doorway Pec Stretch at 60 Degrees Abduction - 3 x daily - 7 x weekly - 1 sets - 3 reps Doorway Pec Stretch at 90 Degrees Abduction - 3 x daily - 7 x weekly - 1 sets - 3 reps - 30 seconds hold Doorway Pec Stretch at 120 Degrees Abduction - 3 x daily - 7 x weekly - 1 sets - 3 reps - 30 second hold hold Supine Cervical Retraction with Towel - 2 x daily - 7 x weekly - 1 sets - 5-10 reps - 10 sec hold Shoulder External Rotation and Scapular Retraction with Resistance - 2 x daily - 7 x weekly - 3 sets - 10 reps Scapular Retraction with Resistance - 2 x daily - 7 x weekly - 3 sets - 10 reps Scapular Retraction with Resistance Advanced - 2 x daily - 7 x weekly - 3 sets - 10 reps Supine Piriformis Stretch with Leg Straight - 2 x daily - 7 x weekly - 1 sets - 3 reps - 30 sec hold Hooklying Isometric Clamshell - 2 x daily - 7 x weekly - 1 sets - 10 reps - 3 sec hold Sit to Stand - 2 x daily - 7 x weekly - 1 sets - 10 reps - 3-5 sec hold Wall Quarter Squat - 2 x daily - 7 x weekly - 1-2 sets - 10 reps - 5-10 sec hold Single Leg Stance with Support - 2 x daily - 7 x weekly - 1 sets - 3 reps - 10-30 sec hold Standing Hip Extension with Counter Support - 2 x daily - 7 x weekly - 1-2 sets - 10 reps - 3-5 sec hold Standing Hip Abduction with Counter Support - 2 x daily - 7 x weekly - 2-3 sets - 10 reps - 2-3 sec hold Seated Hamstring Stretch - 1 x daily - 7 x weekly - 1 sets - 2-3 reps - 15 seconds hold Hooklying Hamstring Stretch  with Strap - 2 x daily - 7 x weekly - 1 sets - 3 reps - 30 sec hold Supine ITB Stretch with Strap - 2 x daily - 7 x weekly - 1 sets - 3 reps - 30 sec hold Seated Shoulder W - 2 x daily - 7 x weekly - 1 sets - 10 reps - 3 sec hold Sidestepping - 2 x daily - 7 x weekly Standing Heel Raise with Support - 1 x daily - 7 x weekly - 1 sets - 10 reps - 1 sec hold Step Up - 2 x daily - 7 x weekly - 1 sets - 10 reps Standing Shoulder Row Reactive Isometric - 2 x daily - 7 x weekly - 1 sets - 10 reps - 3-5 sec hold

## 2021-07-12 ENCOUNTER — Encounter: Payer: Self-pay | Admitting: Rehabilitative and Restorative Service Providers"

## 2021-07-12 ENCOUNTER — Other Ambulatory Visit: Payer: Self-pay

## 2021-07-12 ENCOUNTER — Ambulatory Visit: Payer: Medicare Other | Admitting: Rehabilitative and Restorative Service Providers"

## 2021-07-12 DIAGNOSIS — G8929 Other chronic pain: Secondary | ICD-10-CM | POA: Diagnosis not present

## 2021-07-12 DIAGNOSIS — R29898 Other symptoms and signs involving the musculoskeletal system: Secondary | ICD-10-CM

## 2021-07-12 DIAGNOSIS — M25561 Pain in right knee: Secondary | ICD-10-CM | POA: Diagnosis not present

## 2021-07-12 DIAGNOSIS — R293 Abnormal posture: Secondary | ICD-10-CM | POA: Diagnosis not present

## 2021-07-12 DIAGNOSIS — M542 Cervicalgia: Secondary | ICD-10-CM

## 2021-07-12 DIAGNOSIS — R531 Weakness: Secondary | ICD-10-CM | POA: Diagnosis not present

## 2021-07-12 NOTE — Patient Instructions (Signed)
Access Code: [email or web address] URL: [email or web address] Date: 07/12/2021 Prepared by: Gillermo Murdoch  Exercises Seated Cervical Retraction - 3 x daily - 7 x weekly - 1 sets - 10 reps Standing Scapular Retraction - 3 x daily - 7 x weekly - 1 sets - 10 reps - 10 hold Shoulder External Rotation and Scapular Retraction - 3 x daily - 7 x weekly - 1 sets - 10 reps - 3 hold Shoulder External Rotation in 45 Degrees Abduction - 2 x daily - 7 x weekly - 1-2 sets - 10 reps - 3 sec hold Doorway Pec Stretch at 60 Degrees Abduction - 3 x daily - 7 x weekly - 1 sets - 3 reps Doorway Pec Stretch at 90 Degrees Abduction - 3 x daily - 7 x weekly - 1 sets - 3 reps - 30 seconds hold Doorway Pec Stretch at 120 Degrees Abduction - 3 x daily - 7 x weekly - 1 sets - 3 reps - 30 second hold hold Supine Cervical Retraction with Towel - 2 x daily - 7 x weekly - 1 sets - 5-10 reps - 10 sec hold Shoulder External Rotation and Scapular Retraction with Resistance - 2 x daily - 7 x weekly - 3 sets - 10 reps Scapular Retraction with Resistance - 2 x daily - 7 x weekly - 3 sets - 10 reps Scapular Retraction with Resistance Advanced - 2 x daily - 7 x weekly - 3 sets - 10 reps Supine Piriformis Stretch with Leg Straight - 2 x daily - 7 x weekly - 1 sets - 3 reps - 30 sec hold Hooklying Isometric Clamshell - 2 x daily - 7 x weekly - 1 sets - 10 reps - 3 sec hold Sit to Stand - 2 x daily - 7 x weekly - 1 sets - 10 reps - 3-5 sec hold Wall Quarter Squat - 2 x daily - 7 x weekly - 1-2 sets - 10 reps - 5-10 sec hold Single Leg Stance with Support - 2 x daily - 7 x weekly - 1 sets - 3 reps - 10-30 sec hold Standing Hip Extension with Counter Support - 2 x daily - 7 x weekly - 1-2 sets - 10 reps - 3-5 sec hold Standing Hip Abduction with Counter Support - 2 x daily - 7 x weekly - 2-3 sets - 10 reps - 2-3 sec hold Seated Hamstring Stretch - 1 x daily - 7 x weekly - 1 sets - 2-3 reps - 15 seconds hold Hooklying Hamstring Stretch  with Strap - 2 x daily - 7 x weekly - 1 sets - 3 reps - 30 sec hold Supine ITB Stretch with Strap - 2 x daily - 7 x weekly - 1 sets - 3 reps - 30 sec hold Seated Shoulder W - 2 x daily - 7 x weekly - 1 sets - 10 reps - 3 sec hold Sidestepping - 2 x daily - 7 x weekly Standing Heel Raise with Support - 1 x daily - 7 x weekly - 1 sets - 10 reps - 1 sec hold Step Up - 2 x daily - 7 x weekly - 1 sets - 10 reps Standing Shoulder Row Reactive Isometric - 2 x daily - 7 x weekly - 1 sets - 10 reps - 3-5 sec hold Side Stepping with Resistance at Thighs - 1 x daily - 7 x weekly - 1 sets - 10 reps

## 2021-07-12 NOTE — Therapy (Addendum)
[place], [place], [address] Phone: [phone]   Fax:  [phone]  Physical Therapy Treatment and Discharge Summary  PHYSICAL THERAPY DISCHARGE SUMMARY  Visits from Start of Care: 12  Current functional level related to goals / functional outcomes: See progress note for discharge status    Remaining deficits: Needs to continue with exercise program    Education / Equipment: HEP    Patient agrees to discharge. Patient goals were met. Patient is being discharged due to meeting the stated rehab goals.  Celyn P. Helene Kelp PT, MPH 08/11/21 10:16 AM  Patient Details  Name: Anna [name] MRN: [id number] Date of Birth: 1935-10-01 Referring Provider (PT): Dr Dianah Field   Encounter Date: 07/12/2021   PT End of Session - 07/12/21 1154     Visit Number 12    Number of Visits 16    Date for PT Re-Evaluation 07/16/21    PT Start Time 1148   [place] end of treatment   PT Stop Time 1237    PT Time Calculation (min) 49 min    Activity Tolerance Patient tolerated treatment well             Past Medical History:  Diagnosis Date   Dermatitis    seborrehic scalp   Hiatal hernia    History of basal cell carcinoma 07/20/2017   On nose.    History of melanoma 07/20/2017   Right upper arm    Hyperlipidemia    Macular degeneration     Past Surgical History:  Procedure Laterality Date   BREAST CYST EXCISION Right 09/1962   CATARACT EXTRACTION  03/2015   melonoma Right 06/2016   arm   MOHS SURGERY Left 06/2016   nose    There were no vitals filed for this visit.   Subjective Assessment - 07/12/21 1155     Subjective Continues to improve. Leg is feeling stronger. Pain in the back of the Rt leg has resloved. Still has a "tiny bit of pain" in the Lt neck but not as much as it was. Confident in continuing with independent HEP    Currently in Pain? No/denies    Pain Score 0-No pain    Pain  Location Neck    Pain Orientation Left    Pain Descriptors / Indicators Tightness;Aching    Pain Type Chronic pain                [facility] PT Assessment - 07/12/21 0001       Assessment   Medical Diagnosis Lt cervical dysfunction; OA Rt > Lt knees    Referring Provider (PT) Dr Dianah Field    Onset Date/Surgical Date 01/24/21    Hand Dominance Right    Next MD Visit 07/13/21    Prior Therapy treatment for Lt shoulder and LBP here      AROM   Right Hip Extension 0    Left Hip Extension 0    Right Knee Extension 0    Right Knee Flexion 120    Left Knee Extension 0    Left Knee Flexion 120    Cervical Flexion 56    Cervical Extension 53    Cervical - Right Side Bend 24    Cervical - Left Side Bend 25    Cervical - Right Rotation 56    Cervical - Left Rotation 58 pain in Lt neck      Strength   Right Hip Flexion 4+/5    Right  Hip Extension 4-/5    Right Hip ABduction 4-/5    Left Hip Flexion 4+/5    Left Hip Extension 4-/5    Left Hip ABduction 4+/5    Right Knee Flexion 5/5    Right Knee Extension 5/5    Left Knee Flexion 5/5    Left Knee Extension 5/5      Flexibility   Hamstrings WFL's bilat    Quadriceps tight ~ 90 deg Rt; 95 deg Lt    ITB WFL's bilat    Piriformis tight Rt > Lt      Palpation   Palpation comment improving muscular tightness Lt suboccipital area; Lt > Rt ant/lat/posterior cervical musculature; pecs; upper traps; thoracic paraspinals      Transfers   Comments moving to change positions with greater ease and no pain      Ambulation/Gait   Gait Comments ambulating without antalgic gait; improved stability; no giving away of LE's when standing or walking                           [facility] Adult PT Treatment/Exercise - 07/12/21 0001       Neck Exercises: Supine   Neck Retraction 5 reps;5 secs;10 secs      Knee/Hip Exercises: Stretches   Active Hamstring Stretch Right;2 reps;30 seconds    Active Hamstring Stretch  Limitations seated    Passive Hamstring Stretch Left;2 reps;30 seconds    ITB Stretch Right;2 reps;30 seconds      Knee/Hip Exercises: Aerobic   Nustep L5: 5 min UE 10      Knee/Hip Exercises: Standing   Hip Abduction Stengthening;Right;10 reps;Left;Knee straight    Abduction Limitations with red TB paroximal to knees    Hip Extension Stengthening;Right;Left;10 reps;Knee straight    Extension Limitations with red TB proximal to knees    Forward Step Up Right;Left;10 reps;Hand Hold: 2;Step Height: 4"    Wall Squat 10 reps;5 seconds    SLS 10 sec x 3 reps each LE; UE support as needed for balance    Other Standing Knee Exercises lateral walking 10 ft x 4 reps each direction red TB proximal to knees      Shoulder Exercises: Standing   Other Standing Exercises step back holding UE's in row position at side blue TB 5 sec hold x 8 reps    Other Standing Exercises W  bilat 3 sec hold x 10 reps      Shoulder Exercises: Stretch   Other Shoulder Stretches doorway stretch x 3 positions  (all three slightly lower) 15 sec x 2 reps      Moist Heat Therapy   Number Minutes Moist Heat 10 Minutes    Moist Heat Location Cervical      Electrical Stimulation   Electrical Stimulation Location Lt cervical/shoulder    Electrical Stimulation Action TENS    Electrical Stimulation Parameters to tolerance    Electrical Stimulation Goals Pain;Tone      Manual Therapy   Manual therapy comments skilled palpation to assess response to DN and manual work    Soft tissue mobilization deep tissue work Lt>Rt cervical musculatue              Trigger Point Dry Needling - 07/12/21 0001     Consent Given? Yes    Education Handout Provided Previously provided    Dry Needling Comments Lt    Suboccipitals Response Palpable increased muscle length    Cervical multifidi  Response Palpable increased muscle length;Twitch reponse elicited                   PT Education - 07/12/21 1239     Education  Details HEP    Person(s) Educated Patient    Methods Explanation;Demonstration;Tactile cues;Verbal cues;Handout    Comprehension Verbalized understanding;Returned demonstration;Verbal cues required;Tactile cues required                 PT Long Term Goals - 07/12/21 1207       PT LONG TERM GOAL #1   Title Improve posture and alignment with patient ot demonstrated improved upright posture with postreior shoulder girdle engaged    Time 6    Period Weeks    Status On-going      PT LONG TERM GOAL #2   Title Increase strength bilat LE's allowing patient to return to normal functional activities without "buckling in knee"    Time 6    Period Weeks    Status Achieved      PT LONG TERM GOAL #3   Title Increase strength bilat hips to 4+/5 to 5/5    Time 6    Period Weeks    Status Partially Met      PT LONG TERM GOAL #4   Title Independent in HEP    Time 6    Period Weeks    Status Achieved      PT LONG TERM GOAL #5   Title Improve functional limitation score to 60    Baseline (neck - 65)    Time 6    Period Weeks    Status Deferred                   Plan - 07/12/21 1208     Clinical Impression Statement Patient reports Rt knee pain and discomort has resolved. She is workig on her exercises at home. Patient demonstrates good gains in strength in LE's but continues to have weakness in bilat hips. Myrna has good increases in cervical ROM and mobility but has continued discomfort with Lt cervical rotation. She has resumed all normal functional activities. Goals of therapy have been partially accoplished.    Rehab Potential Good    PT Frequency 2x / week    PT Duration 6 weeks    PT Treatment/Interventions ADLs/Self Care Home Management;Aquatic Therapy;Cryotherapy;Electrical Stimulation;Iontophoresis 53m/ml Dexamethasone;Moist Heat;Ultrasound;Therapeutic activities;Therapeutic exercise;Balance training;Neuromuscular re-education;Patient/family education;Manual  techniques;Dry needling;Taping    PT Next Visit Plan continue DN, manual work and stretching to Lt cervical musculature, Rt HS and ITB as indicated; progress with LE strengthening; progress with posterior shoulder girdle strengthening and cervical mobility; manual work through anterior chest/pecs; upper trap; ant/lat/posterior cervical musculature as indicated; modalities as indicated; aquatic therapy would be great but it is difficult for patient to arrange transportation    PT [place]            Patient will benefit from skilled therapeutic intervention in order to improve the following deficits and impairments:     Visit Diagnosis: Chronic pain of right knee  Abnormal posture  Other symptoms and signs involving the musculoskeletal system  Cervicalgia  Weakness generalized     Problem List Patient Active Problem List   Diagnosis Date Noted   DDD (degenerative disc disease), cervical 05/04/2021   Neck pain 05/03/2021   Anorexia 149/44/9675  Umbilical hernia without obstruction and without gangrene 06/30/2020   Meralgia paresthetica of right side 06/22/2020  Venous insufficiency of both lower extremities 06/22/2020   Primary osteoarthritis, left shoulder 06/03/2020   Bilateral lower extremity edema 05/25/2020   Aortic atherosclerosis ([place]) 05/13/2020   Bunion, right foot 01/75/1025   Lichen planus 85/27/7824   Asthma in adult 11/22/2019   Mixed restrictive and obstructive lung disease ([place]) 11/22/2019   Primary osteoarthritis of right knee 11/22/2019   Acute pain of left shoulder 09/05/2019   Chronic bilateral low back pain without sciatica 09/05/2019   OAB (overactive bladder) 08/08/2019   Insomnia 08/08/2019   History of melanoma 07/20/2017   History of basal cell carcinoma 07/20/2017   IFG (impaired fasting glucose) 07/17/2017   Macular degeneration    Chronic fatigue 08/06/2015   Gastroesophageal reflux disease without esophagitis  08/06/2015   Glaucoma 08/06/2015   Hiatal hernia 08/06/2015   Melanoma of right upper arm ([place]) 08/06/2015   Mixed hyperlipidemia 08/06/2015   Rupture of right biceps tendon 08/06/2015   Biceps tendonitis of both shoulders 08/06/2015    Ulis Kaps Nilda Simmer, PT, MPH  07/12/2021, 12:50 PM  [facility] [place], [place], [address] Phone: [phone]   Fax:  [phone]  Name: Lolita Faulds MRN: [id number] Date of Birth: 1935/12/06

## 2021-07-13 ENCOUNTER — Ambulatory Visit (INDEPENDENT_AMBULATORY_CARE_PROVIDER_SITE_OTHER): Payer: Medicare Other | Admitting: Sports Medicine

## 2021-07-13 DIAGNOSIS — M5030 Other cervical disc degeneration, unspecified cervical region: Secondary | ICD-10-CM | POA: Diagnosis not present

## 2021-07-13 DIAGNOSIS — M1711 Unilateral primary osteoarthritis, right knee: Secondary | ICD-10-CM | POA: Diagnosis not present

## 2021-07-13 NOTE — Assessment & Plan Note (Signed)
Moya returns, she is a very pleasant 85 year old female, knee osteoarthritis, she had an effusion on exam at the last visit, we did an aspiration and injection, physical therapy and she returns today completely pain-free. Return as needed.

## 2021-07-13 NOTE — Progress Notes (Signed)
    Procedures performed today:    None.  Independent interpretation of notes and tests performed by another provider:   None.  Brief History, Exam, Impression, and Recommendations:    Primary osteoarthritis of right knee Anna [name] returns, she is a very pleasant 85 year old female, knee osteoarthritis, she had an effusion on exam at the last visit, we did an aspiration and injection, physical therapy and she returns today completely pain-free. Return as needed.  DDD (degenerative disc disease), cervical Anna [name] also has severe cervical DDD on x-rays, we continued her baclofen and restarted her Celebrex, we added aggressive physical therapy with dry needling and she has reported essentially complete pain relief, extremely happy with how things have gone, in the future should this recur we could certainly just start her out with physical therapy.    ___________________________________________ Gwen Her. Dianah Field, M.D., ABFM., CAQSM. Primary Care and [place] Instructor of [place] of [facility] of Medicine

## 2021-07-13 NOTE — Assessment & Plan Note (Signed)
Sharnell also has severe cervical DDD on x-rays, we continued her baclofen and restarted her Celebrex, we added aggressive physical therapy with dry needling and she has reported essentially complete pain relief, extremely happy with how things have gone, in the future should this recur we could certainly just start her out with physical therapy.

## 2021-07-16 ENCOUNTER — Encounter: Payer: Medicare Other | Admitting: Rehabilitative and Restorative Service Providers"

## 2021-07-22 ENCOUNTER — Other Ambulatory Visit: Payer: Self-pay | Admitting: Physician Assistant

## 2021-07-22 DIAGNOSIS — M542 Cervicalgia: Secondary | ICD-10-CM

## 2021-07-26 DIAGNOSIS — H353211 Exudative age-related macular degeneration, right eye, with active choroidal neovascularization: Secondary | ICD-10-CM | POA: Diagnosis not present

## 2021-08-03 ENCOUNTER — Other Ambulatory Visit: Payer: Self-pay | Admitting: Family Medicine

## 2021-08-03 DIAGNOSIS — Z1231 Encounter for screening mammogram for malignant neoplasm of breast: Secondary | ICD-10-CM

## 2021-08-05 DIAGNOSIS — Z20822 Contact with and (suspected) exposure to covid-19: Secondary | ICD-10-CM | POA: Diagnosis not present

## 2021-09-10 ENCOUNTER — Ambulatory Visit: Payer: Medicare Other

## 2021-09-13 DIAGNOSIS — L821 Other seborrheic keratosis: Secondary | ICD-10-CM | POA: Diagnosis not present

## 2021-09-13 DIAGNOSIS — L578 Other skin changes due to chronic exposure to nonionizing radiation: Secondary | ICD-10-CM | POA: Diagnosis not present

## 2021-09-13 DIAGNOSIS — Z85820 Personal history of malignant melanoma of skin: Secondary | ICD-10-CM | POA: Diagnosis not present

## 2021-09-13 DIAGNOSIS — L209 Atopic dermatitis, unspecified: Secondary | ICD-10-CM | POA: Diagnosis not present

## 2021-09-14 ENCOUNTER — Ambulatory Visit
Admission: RE | Admit: 2021-09-14 | Discharge: 2021-09-14 | Disposition: A | Discharge status: Home / Self Care | Payer: Medicare Other | Source: Ambulatory Visit | Attending: Family Medicine | Admitting: Family Medicine

## 2021-09-14 DIAGNOSIS — Z1231 Encounter for screening mammogram for malignant neoplasm of breast: Secondary | ICD-10-CM

## 2021-09-14 NOTE — Progress Notes (Signed)
Please call patient. Normal mammogram.  Repeat in 1 year.  

## 2021-09-24 DIAGNOSIS — H353122 Nonexudative age-related macular degeneration, left eye, intermediate dry stage: Secondary | ICD-10-CM | POA: Diagnosis not present

## 2021-09-24 DIAGNOSIS — H353211 Exudative age-related macular degeneration, right eye, with active choroidal neovascularization: Secondary | ICD-10-CM | POA: Diagnosis not present

## 2021-09-24 DIAGNOSIS — H401131 Primary open-angle glaucoma, bilateral, mild stage: Secondary | ICD-10-CM | POA: Diagnosis not present

## 2021-09-24 DIAGNOSIS — H538 Other visual disturbances: Secondary | ICD-10-CM | POA: Diagnosis not present

## 2021-09-29 IMAGING — DX DG CERVICAL SPINE COMPLETE 4+V
6 series · 8 of 8 positions shown · non-contrast
Comparison: None.

CLINICAL DATA: Left upper neck and base of head pain for 3 months

EXAM:
CERVICAL SPINE - COMPLETE 4+ VIEW

[c-spine lat]
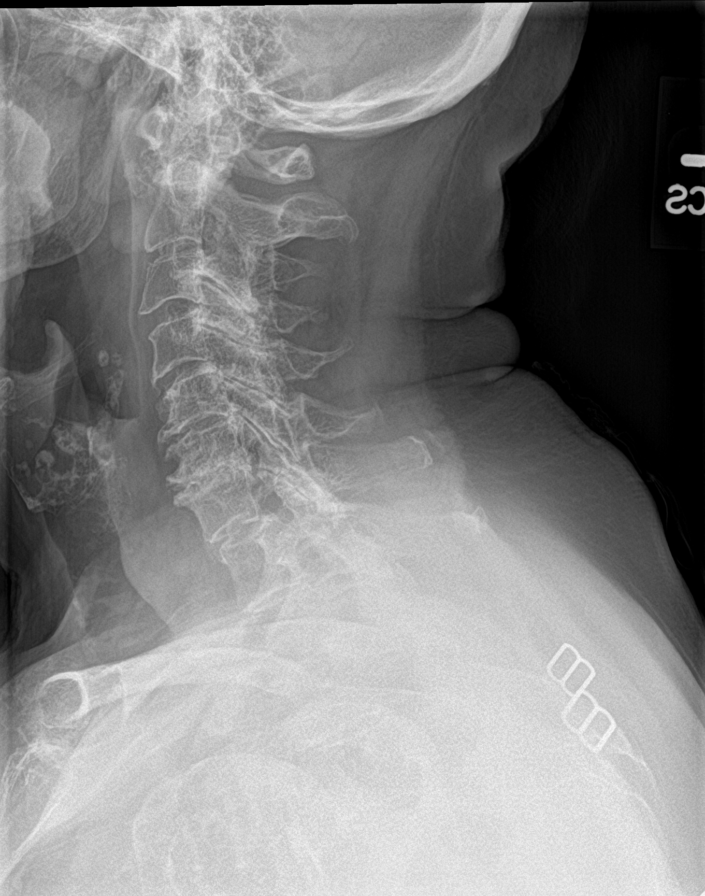

[c-spine obl (1 of 2)]
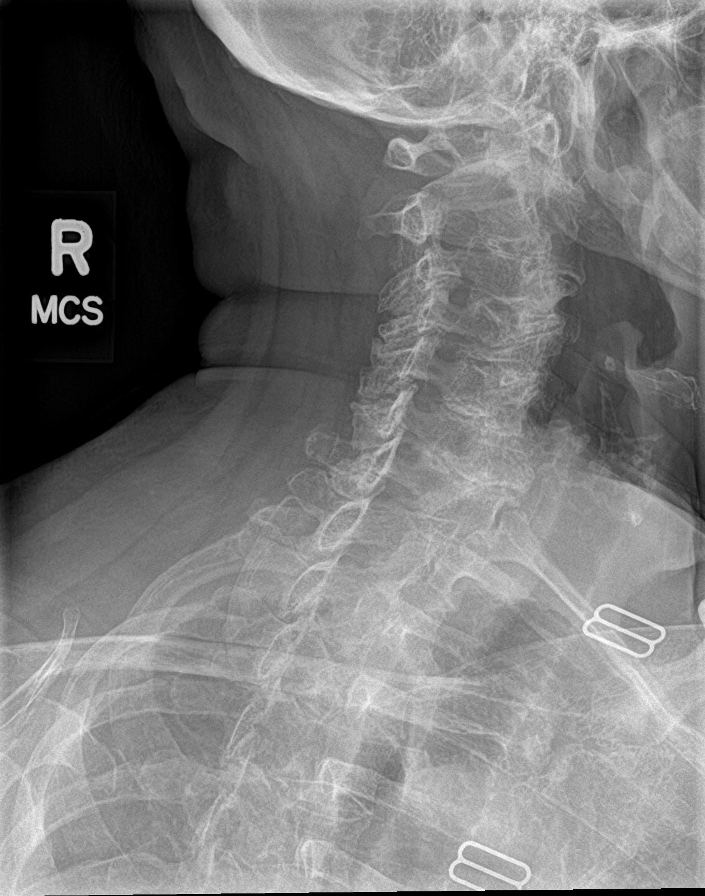

[c-spine obl (2 of 2)]
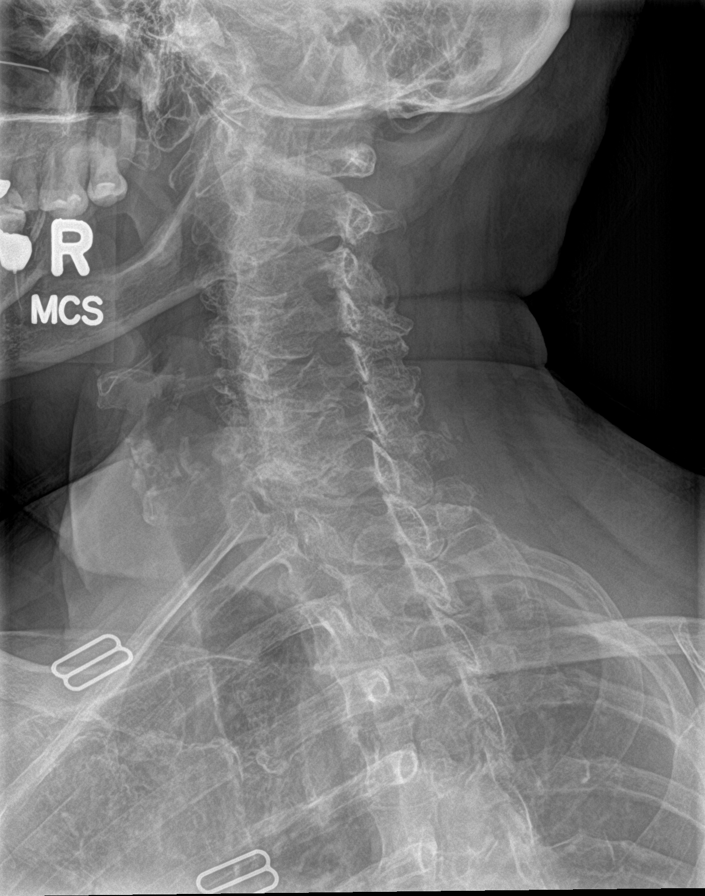

[c-spine ap]
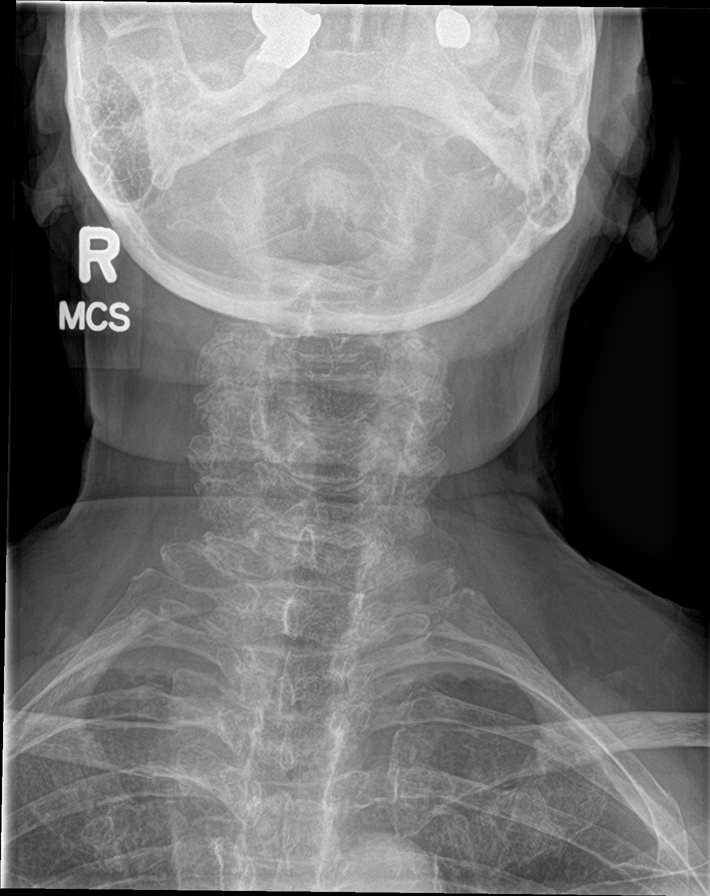

[Series 5: c-spine open mouth · 0.14mm/px · 3 of 3 slices shown]
[im 1/3]
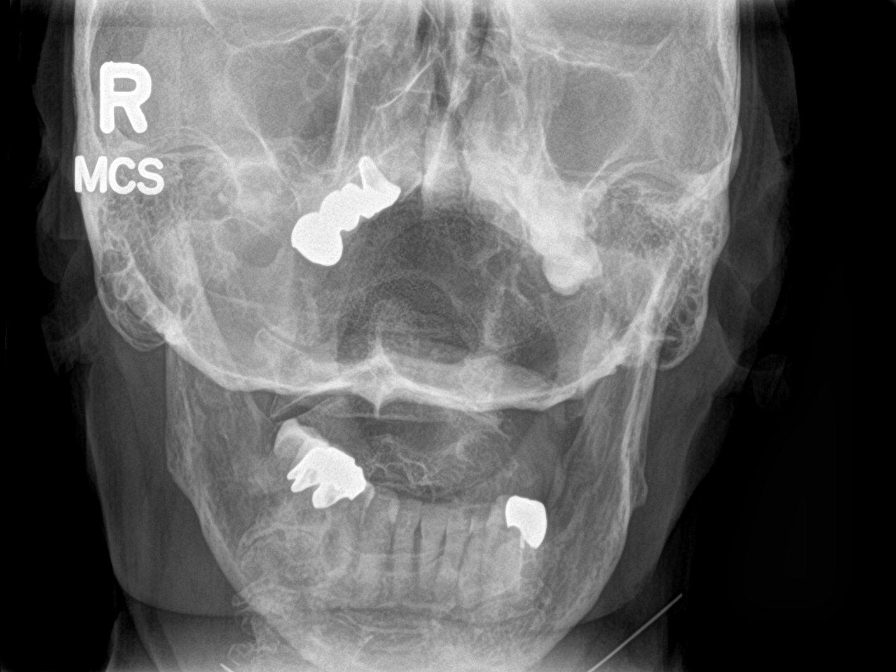
[im 2/3]
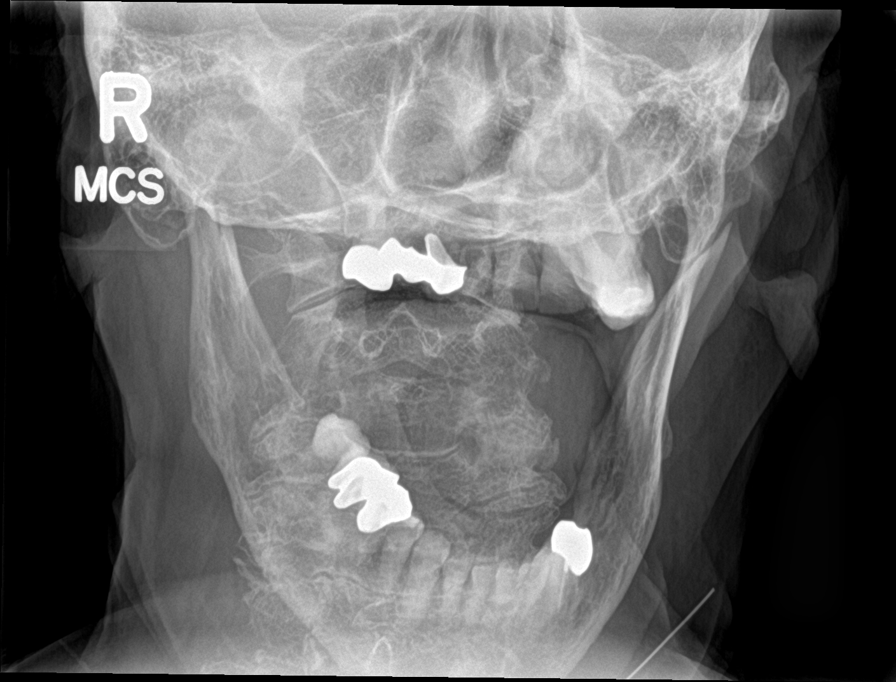
[im 3/3]
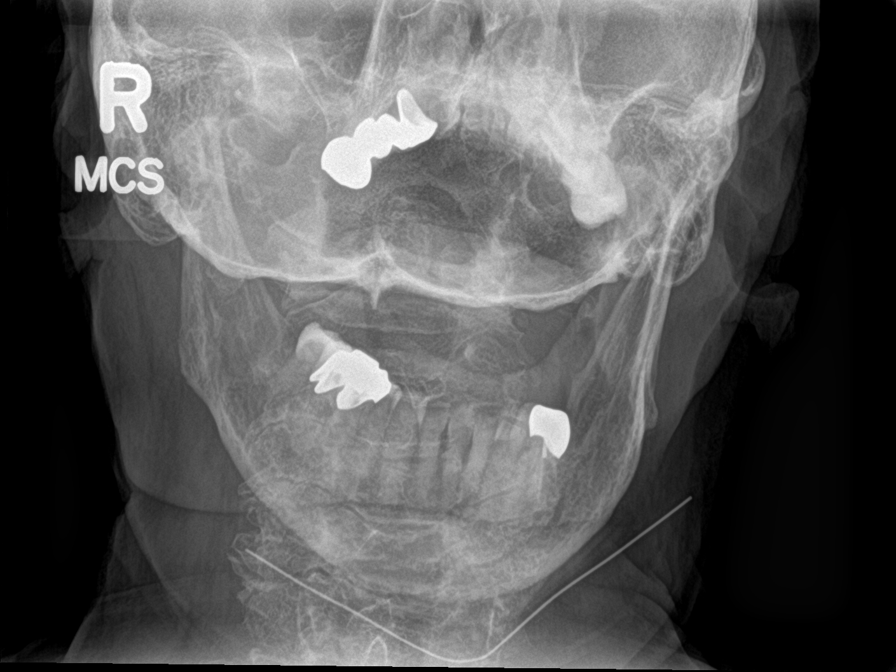

[[person_name]]
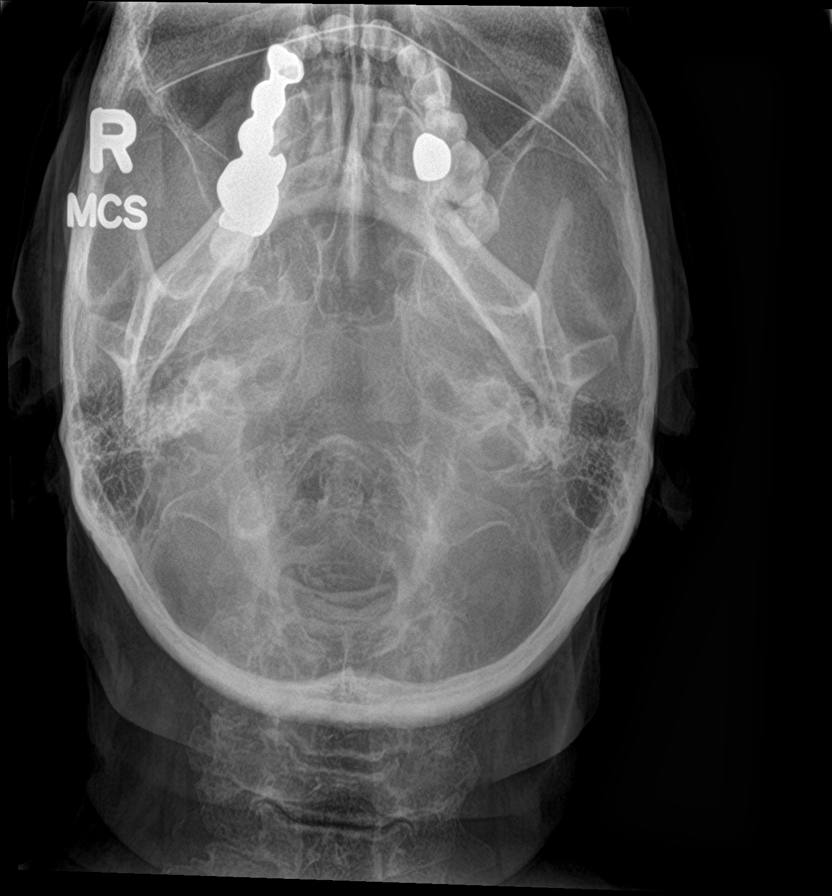

[8 of 8 positions shown; findings below may reference images not displayed]

FINDINGS: Frontal, bilateral oblique, and lateral views of the cervical spine
are obtained. There is mild anterolisthesis of C7 on T1 likely due
to facet hypertrophy. Otherwise alignment is anatomic. There is
severe facet hypertrophy throughout the entirety of the cervical
spine. Prominent spondylosis is seen at C4-5, C5-6, and C6-7. There
are no acute fractures. Mild right predominant neural foraminal
encroachment is seen at C4-5, C5-6, and C6-7. Mild left predominant
encroachment at C6-7. Soft tissues are unremarkable. Lung apices are
clear.
IMPRESSION: 1. Extensive multilevel lumbar spondylosis and facet hypertrophy as
above. No acute fracture.

## 2021-10-11 ENCOUNTER — Other Ambulatory Visit: Payer: Self-pay | Admitting: Family Medicine

## 2021-10-11 DIAGNOSIS — Z20822 Contact with and (suspected) exposure to covid-19: Secondary | ICD-10-CM | POA: Diagnosis not present

## 2021-10-13 ENCOUNTER — Other Ambulatory Visit: Payer: Self-pay

## 2021-10-14 ENCOUNTER — Encounter: Payer: Self-pay | Admitting: Family Medicine

## 2021-10-14 ENCOUNTER — Other Ambulatory Visit: Payer: Self-pay

## 2021-10-14 ENCOUNTER — Ambulatory Visit (INDEPENDENT_AMBULATORY_CARE_PROVIDER_SITE_OTHER): Payer: Medicare Other | Admitting: Family Medicine

## 2021-10-14 VITALS — BP 152/69 | HR 77 | Temp 98.0°F | Ht 63.0 in | Wt 178.0 lb

## 2021-10-14 DIAGNOSIS — N898 Other specified noninflammatory disorders of vagina: Secondary | ICD-10-CM | POA: Diagnosis not present

## 2021-10-14 DIAGNOSIS — J441 Chronic obstructive pulmonary disease with (acute) exacerbation: Secondary | ICD-10-CM | POA: Diagnosis not present

## 2021-10-14 DIAGNOSIS — R030 Elevated blood-pressure reading, without diagnosis of hypertension: Secondary | ICD-10-CM

## 2021-10-14 MED ORDER — DOXYCYCLINE HYCLATE 100 MG PO TABS: 100.0000 mg | ORAL_TABLET | Freq: Two times a day (BID) | ORAL | 0 refills | Status: DC

## 2021-10-14 MED ORDER — PREDNISONE 20 MG PO TABS: 40.0000 mg | ORAL_TABLET | Freq: Every day | ORAL | 0 refills | Status: DC

## 2021-10-14 MED ORDER — TRIAMCINOLONE ACETONIDE 0.1 % EX CREA: 1.0000 "application " | TOPICAL_CREAM | Freq: Two times a day (BID) | CUTANEOUS | 0 refills | Status: DC | PRN

## 2021-10-14 NOTE — Progress Notes (Signed)
Acute Office Visit  Subjective:    Patient ID: Anna [name], female    DOB: Aug 19, 1936, 86 y.o.   MRN: [id number]  Chief Complaint  Patient presents with   Nasal Congestion    HPI Patient is in today for nasal congestion x 6 days.  + runny nose and congestion in her chest. Using mucinex , inhaler, robitussin.  Nothing helping.  ST the first day. No fever, chills or body aches.  She feels like her upper symptoms in her sinuses and ears are actually getting better but she feels like it is moving into her chest and she started wheezing last night.  She has been using her inhaler twice a day.  She also has a steroid cream that she uses in her vaginal area sometimes it helps with itching she would like a refill.  Also reports that she is noticed a little bit of sharp pain around the labia area when she is trying to evacuate a bowel movement.  It is the only time she really feels that she is not having any discomfort with urination.  Past Medical History:  Diagnosis Date   Dermatitis    seborrehic scalp   Hiatal hernia    History of basal cell carcinoma 07/20/2017   On nose.    History of melanoma 07/20/2017   Right upper arm    Hyperlipidemia    Macular degeneration     Past Surgical History:  Procedure Laterality Date   BREAST CYST EXCISION Right 09/1962   CATARACT EXTRACTION  03/2015   melonoma Right 06/2016   arm   MOHS SURGERY Left 06/2016   nose    Family History  Problem Relation Age of Onset   Hyperlipidemia Sister    Macular degeneration Sister    Cancer - Prostate Brother    Atrial fibrillation Maternal Aunt    Cancer Mother        pancreatic   Cancer Father        liver    Social History   Socioeconomic History   Marital status: Single    Spouse name: Not on file   Number of children: Not on file   Years of education: 12+   Highest education level: Master's degree (e.g., MA, MS, MEng, MEd, MSW, MBA)  Occupational History   Occupation: retired     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  Tobacco Use   Smoking status: Never   Smokeless tobacco: Never  Vaping Use   Vaping Use: Never used  Substance and Sexual Activity   Alcohol use: Yes    Alcohol/week: 1.0 standard drink    Types: 1 Glasses of wine per week    Comment: occasionally   Drug use: No   Sexual activity: Never  Other Topics Concern   Not on file  Social History Narrative   She has a masters degree in Film/video editor.  He lives alone in a retirement community.  Her sister is Pharmacist, community.    Social Determinants of Health   Financial Resource Strain: Not on file  Food Insecurity: Not on file  Transportation Needs: Not on file  Physical Activity: Not on file  Stress: Not on file  Social Connections: Not on file  Intimate Partner Violence: Not on file    Outpatient Medications Prior to Visit  Medication Sig Dispense Refill   ALPRAZolam (XANAX) 0.25 MG tablet TAKE ONE TABLET (0.25MG  TOTAL) BY MOUTH AT BEDTIME AS NEEDED FOR SLEEP  30 tablet 0   AMBULATORY NON FORMULARY MEDICATION 20-30 mmHg Knee High Compression Stockings. 2 each 2   ammonium lactate (AMLACTIN) 12 % cream Apply topically as needed for dry skin.     baclofen (LIORESAL) 10 MG tablet Take 1 tablet (10 mg total) by mouth at bedtime. 30 each 2   Bevacizumab 2.75 MG/0.11ML SOSY Inject into the eye.     calcium citrate-vitamin D (CITRACAL+D) 315-200 MG-UNIT tablet Take 1 tablet by mouth 2 (two) times daily.     celecoxib (CELEBREX) 200 MG capsule 1 to 2 capsules daily 90 capsule 3   COD LIVER OIL PO Take 1 mL by mouth daily.     Flaxseed Oil OIL 1,000 mg.     fluticasone-salmeterol (ADVAIR) 250-50 MCG/ACT AEPB Inhale 1 puff into the lungs 2 (two) times daily. Needs appointment. 60 each 11   ketoconazole (NIZORAL) 2 % shampoo USE THREE TIMES A WEEK AS DIRECTED     latanoprost (XALATAN) 0.005 % ophthalmic solution Place 1 drop into both eyes daily.     lovastatin  (MEVACOR) 40 MG tablet TAKE ONE TABLET BY MOUTH NIGHTLY/ labs for further refills 30 tablet 0   meclizine (ANTIVERT) 25 MG tablet Take 1 tablet (25 mg total) by mouth 3 (three) times daily as needed for dizziness. 30 tablet 0   Multiple Vitamins-Minerals (PRESERVISION AREDS 2) CAPS Take by mouth.     MYRBETRIQ 25 MG TB24 tablet Take 1 tablet (25 mg total) by mouth daily. 30 tablet 11   omeprazole (PRILOSEC) 40 MG capsule Take 1 capsule (40 mg total) by mouth daily. 90 capsule 3   sucralfate (CARAFATE) 1 g tablet TAKE 1 TABLET BY MOUTH  TWICE DAILY 180 tablet 3   timolol (BETIMOL) 0.5 % ophthalmic solution Place 1 drop into the left eye 2 (two) times daily.     timolol (TIMOPTIC) 0.5 % ophthalmic solution      TURMERIC PO Take 1,000 mg by mouth.     UNABLE TO FIND Right eye injection every 5-6 weeks for Macular Degeneration     vitamin B-12 (CYANOCOBALAMIN) 1000 MCG tablet Take 1,000 mcg by mouth daily.      furosemide (LASIX) 20 MG tablet Take 1 tablet (20 mg total) by mouth as needed for edema. You may take 1 tablet up to once every day for leg swelling not improved with stockings. 30 tablet 3   triamcinolone (KENALOG) 0.1 % Apply 1 application topically 2 (two) times daily as needed. Do not use for more than 2 weeks at a time 30 g 0   No facility-administered medications prior to visit.    Allergies  Allergen Reactions   Aleve [Naproxen Sodium]     Causes elevation in cholesterol    Azithromycin Rash    Review of Systems     Objective:    Physical Exam Constitutional:      Appearance: She is well-developed.  HENT:     Head: Normocephalic and atraumatic.     Right Ear: External ear normal.     Left Ear: External ear normal.     Nose: Nose normal.  Eyes:     Conjunctiva/sclera: Conjunctivae normal.     Pupils: Pupils are equal, round, and reactive to light.  Neck:     Thyroid: No thyromegaly.  Cardiovascular:     Rate and Rhythm: Normal rate and regular rhythm.     Heart  sounds: Normal heart sounds.  Pulmonary:     Effort: Pulmonary effort is  normal.     Breath sounds: Normal breath sounds. No wheezing.  Musculoskeletal:     Cervical back: Neck supple.  Lymphadenopathy:     Cervical: No cervical adenopathy.  Skin:    General: Skin is warm and dry.  Neurological:     Mental Status: She is alert and oriented to person, place, and time.    BP (!) 152/69    Pulse 77    Temp 98 F (36.7 C) (Oral)    Ht 5\' 3"  (1.6 m)    Wt 178 lb (80.7 kg)    SpO2 96%    BMI 31.53 kg/m  Wt Readings from Last 3 Encounters:  10/14/21 178 lb (80.7 kg)  05/03/21 171 lb (77.6 kg)  12/04/20 175 lb (79.4 kg)    Health Maintenance Due  Topic Date Due   Pneumonia Vaccine 53+ Years old (1 - PCV) Never done   Zoster Vaccines- Shingrix (1 of 2) Never done   INFLUENZA VACCINE  04/26/2021   COVID-19 Vaccine (3 - Booster for Janssen series) 06/18/2021    There are no preventive care reminders to display for this patient.   Lab Results  Component Value Date   TSH 0.69 05/12/2020   Lab Results  Component Value Date   WBC 6.4 05/12/2020   HGB 15.0 05/12/2020   HCT 45.5 (H) 05/12/2020   MCV 91.2 05/12/2020   PLT 238 05/12/2020   Lab Results  Component Value Date   NA 140 05/25/2020   K 3.8 05/25/2020   CO2 30 05/25/2020   GLUCOSE 103 (H) 05/25/2020   BUN 9 05/25/2020   CREATININE 0.77 05/25/2020   BILITOT 0.9 05/12/2020   ALKPHOS 63 11/14/2016   AST 28 05/12/2020   ALT 11 05/12/2020   PROT 6.0 (L) 05/12/2020   CALCIUM 9.0 05/25/2020   Lab Results  Component Value Date   CHOL 170 08/09/2019   Lab Results  Component Value Date   HDL 57 08/09/2019   Lab Results  Component Value Date   LDLCALC 91 08/09/2019   Lab Results  Component Value Date   TRIG 122 08/09/2019   Lab Results  Component Value Date   CHOLHDL 3.0 08/09/2019   Lab Results  Component Value Date   HGBA1C 5.5 03/16/2020       Assessment & Plan:   Problem List Items Addressed  This Visit   None Visit Diagnoses     COPD exacerbation ([place])    -  Primary   Relevant Medications   predniSONE (DELTASONE) 20 MG tablet   Vaginal itching       Relevant Medications   triamcinolone cream (KENALOG) 0.1 %   Elevated BP without diagnosis of hypertension          COPD exacerbation-we will go ahead and treat with azithromycin and prednisone.  Did warn about potential side effects of prednisone.  VAginal pressure/pain with BM -she denies any rash or irritation.  It is just a sharp pain with bowel movement.  She could have a rectocele that certainly consideration.  We discussed putting a little pressure in the perineum with bowel movement to see if that makes a difference if not then we can always refer her to GYN or for further evaluation just to make sure there is nothing else going on that we might be missing.  Meds ordered this encounter  Medications   triamcinolone cream (KENALOG) 0.1 %    Sig: Apply 1 application topically 2 (two) times daily as  needed. Do not use for more than 2 weeks at a time    Dispense:  30 g    Refill:  0   predniSONE (DELTASONE) 20 MG tablet    Sig: Take 2 tablets (40 mg total) by mouth daily with breakfast.    Dispense:  10 tablet    Refill:  0   doxycycline (VIBRA-TABS) 100 MG tablet    Sig: Take 1 tablet (100 mg total) by mouth 2 (two) times daily.    Dispense:  14 tablet    Refill:  0     Beatrice Lecher, MD

## 2021-10-28 ENCOUNTER — Telehealth: Payer: Self-pay

## 2021-10-28 ENCOUNTER — Ambulatory Visit: Payer: Medicare Other

## 2021-10-28 DIAGNOSIS — R030 Elevated blood-pressure reading, without diagnosis of hypertension: Secondary | ICD-10-CM

## 2021-10-28 MED ORDER — LISINOPRIL 5 MG PO TABS: 5.0000 mg | ORAL_TABLET | Freq: Every day | ORAL | 1 refills | Status: DC

## 2021-10-28 NOTE — Telephone Encounter (Signed)
Patient called stating she was scheduled for a nurse visit today for BP recheck but decided to have her BP checked at [facility] today. Patient stated her BP was 163/91 at 9:15am. Patient stated she will have [place] fax over her BP reading. Forward to Dr. Madilyn Fireman for review.

## 2021-10-28 NOTE — Telephone Encounter (Signed)
OK, based on that blood pressures similar to what we got while she was here some get a go ahead and send over a new prescription for a very low-dose blood pressure pill.  I really would like for her to have her blood pressure checked again in about 2 weeks and she will need a blood test called a BMP to check her kidney and potassium

## 2021-10-28 NOTE — Telephone Encounter (Signed)
Patient sent a fax with her BP reading for today  BP is:  163/91 at 9:15 AM  Pulse Rte:  83

## 2021-10-29 NOTE — Telephone Encounter (Signed)
Patient advised and scheduled.  

## 2021-10-29 NOTE — Telephone Encounter (Signed)
See other telephone message 

## 2021-11-05 DIAGNOSIS — L600 Ingrowing nail: Secondary | ICD-10-CM | POA: Diagnosis not present

## 2021-11-05 DIAGNOSIS — M2042 Other hammer toe(s) (acquired), left foot: Secondary | ICD-10-CM | POA: Diagnosis not present

## 2021-11-05 DIAGNOSIS — B351 Tinea unguium: Secondary | ICD-10-CM | POA: Diagnosis not present

## 2021-11-05 DIAGNOSIS — M79672 Pain in left foot: Secondary | ICD-10-CM | POA: Diagnosis not present

## 2021-11-05 DIAGNOSIS — M2041 Other hammer toe(s) (acquired), right foot: Secondary | ICD-10-CM | POA: Diagnosis not present

## 2021-11-05 DIAGNOSIS — M79671 Pain in right foot: Secondary | ICD-10-CM | POA: Diagnosis not present

## 2021-11-10 ENCOUNTER — Other Ambulatory Visit: Payer: Self-pay | Admitting: Family Medicine

## 2021-11-12 ENCOUNTER — Ambulatory Visit (INDEPENDENT_AMBULATORY_CARE_PROVIDER_SITE_OTHER): Payer: Medicare Other | Admitting: Family Medicine

## 2021-11-12 ENCOUNTER — Other Ambulatory Visit: Payer: Self-pay

## 2021-11-12 VITALS — BP 139/82 | HR 63 | Ht 63.0 in | Wt 179.1 lb

## 2021-11-12 DIAGNOSIS — I10 Essential (primary) hypertension: Secondary | ICD-10-CM

## 2021-11-12 MED ORDER — OLMESARTAN MEDOXOMIL 5 MG PO TABS: 5.0000 mg | ORAL_TABLET | Freq: Every day | ORAL | 0 refills | Status: DC

## 2021-11-12 NOTE — Progress Notes (Signed)
Patient is here for blood pressure check.   Previous BP was 152/69  1st BP today: 167/85  2nd BP today (after 10  minutes): 139/82  Denies chest pain, dizziness, shortness of breath, severe headache, or nosebleeds.  Taking medication as prescribed. Denies missed doses.  Pt states slight tickling cough with phlegm since starting med.   Per Dr. Madilyn Fireman, switching BP medication today.

## 2021-11-12 NOTE — Progress Notes (Signed)
Blood pressure today definitely looks much better so she did have a good response.  So we will switch to an ARB secondary to cough.  I would like to see her back in about 3 to 4 weeks with me so we can make sure that she is responding well to medication as the cough has resolved.  Meds ordered this encounter  Medications   olmesartan (BENICAR) 5 MG tablet    Sig: Take 1 tablet (5 mg total) by mouth daily.    Dispense:  90 tablet    Refill:  0

## 2021-11-22 DIAGNOSIS — H353211 Exudative age-related macular degeneration, right eye, with active choroidal neovascularization: Secondary | ICD-10-CM | POA: Diagnosis not present

## 2021-12-06 ENCOUNTER — Other Ambulatory Visit: Payer: Self-pay | Admitting: Family Medicine

## 2021-12-08 ENCOUNTER — Other Ambulatory Visit: Payer: Self-pay | Admitting: Family Medicine

## 2021-12-12 ENCOUNTER — Other Ambulatory Visit: Payer: Self-pay | Admitting: Family Medicine

## 2021-12-22 ENCOUNTER — Other Ambulatory Visit: Payer: Self-pay

## 2021-12-22 ENCOUNTER — Emergency Department
Admission: RE | Admit: 2021-12-22 | Discharge: 2021-12-22 | Disposition: A | Discharge status: Home / Self Care | Payer: Medicare Other | Source: Ambulatory Visit

## 2021-12-22 VITALS — BP 130/90 | HR 74 | Temp 97.5°F | Resp 16 | Ht 63.0 in | Wt 173.0 lb

## 2021-12-22 DIAGNOSIS — S39012A Strain of muscle, fascia and tendon of lower back, initial encounter: Secondary | ICD-10-CM

## 2021-12-22 DIAGNOSIS — M62830 Muscle spasm of back: Secondary | ICD-10-CM

## 2021-12-22 LAB — POCT URINALYSIS DIP (MANUAL ENTRY)
Bilirubin, UA: NEGATIVE
Blood, UA: NEGATIVE
Glucose, UA: NEGATIVE mg/dL
Ketones, POC UA: NEGATIVE mg/dL
Leukocytes, UA: NEGATIVE
Nitrite, UA: NEGATIVE
Spec Grav, UA: >=1.03 — AB (ref 1.010–1.025)
Urobilinogen, UA: 1 E.U./dL
pH, UA: 5.5 (ref 5.0–8.0)

## 2021-12-22 MED ORDER — METHYLPREDNISOLONE 4 MG PO TBPK: ORAL_TABLET | ORAL | 0 refills | Status: DC

## 2021-12-22 MED ORDER — BACLOFEN 10 MG PO TABS: 10.0000 mg | ORAL_TABLET | Freq: Three times a day (TID) | ORAL | 0 refills | Status: DC

## 2021-12-22 NOTE — ED Triage Notes (Signed)
Left sided back pain - lower - aching ?Spread across lower back  ?On  celebrex for arthritis - told not to take any other pain meds  ?Started 2 nights ago - unable to sleep  ?Urinary frequency - norm for pt  ?Decreased appetite ?Has not taken BP med today  ? ?

## 2021-12-22 NOTE — Discharge Instructions (Addendum)
Informed patient that urinalysis was within normal limits although appears to be dehydrated. Advised patient to take medication as directed with food to completion.  Advised may take baclofen daily or as needed for accompanying muscle spasms of lower back.  Advised patient that she would not not need to take her daily Celebrex or OTC Tylenol while taking these medications.  Encouraged patient to increase daily water intake while taking these medications.  Advised patient if symptoms worsen and/or unresolved please follow-up with PCP or here for further evaluation. ?

## 2021-12-22 NOTE — ED Provider Notes (Signed)
?[place] ? ? ? ?CSN: [id number] ?Arrival date & time: 12/22/21  9935 ? ? ?  ? ?History   ?Chief Complaint ?Chief Complaint  ?Patient presents with  ? Urinary Frequency  ?  Suspect at UTI or Kidney infection - Entered by patient  ? Back Pain  ?  Left   ? ? ?HPI ?Anna [name] is a 86 y.o. female.  ? ?HPI Pleasant 86 year old female presents with urinary frequency and left-sided low back pain for 2 days.  Patient reports typical to have urinary frequency as she has a history of OAB.  PMH significant for chronic bilateral low back pain without sciatica and is taking Celebrex for this, reports was advised to take no other pain medication.  Patient suspects UTI or kidney infection.  PMH significant for BCC, aortic atherosclerosis, and venous insufficiency of both extremities. ? ?Past Medical History:  ?Diagnosis Date  ? Dermatitis   ? seborrehic scalp  ? Hiatal hernia   ? History of basal cell carcinoma 07/20/2017  ? On nose.   ? History of melanoma 07/20/2017  ? Right upper arm   ? Hyperlipidemia   ? Macular degeneration   ? ? ?Patient Active Problem List  ? Diagnosis Date Noted  ? DDD (degenerative disc disease), cervical 05/04/2021  ? Neck pain 05/03/2021  ? Anorexia 06/30/2020  ? Umbilical hernia without obstruction and without gangrene 06/30/2020  ? Meralgia paresthetica of right side 06/22/2020  ? Venous insufficiency of both lower extremities 06/22/2020  ? Primary osteoarthritis, left shoulder 06/03/2020  ? Bilateral lower extremity edema 05/25/2020  ? Aortic atherosclerosis ([place]) 05/13/2020  ? Bunion, right foot 11/22/2019  ? Lichen planus 70/17/7939  ? Asthma in adult 11/22/2019  ? Mixed restrictive and obstructive lung disease ([place]) 11/22/2019  ? Primary osteoarthritis of right knee 11/22/2019  ? Acute pain of left shoulder 09/05/2019  ? Chronic bilateral low back pain without sciatica 09/05/2019  ? OAB (overactive bladder) 08/08/2019  ? Insomnia 08/08/2019  ? History of melanoma 07/20/2017  ?  History of basal cell carcinoma 07/20/2017  ? IFG (impaired fasting glucose) 07/17/2017  ? Macular degeneration   ? Chronic fatigue 08/06/2015  ? Gastroesophageal reflux disease without esophagitis 08/06/2015  ? Glaucoma 08/06/2015  ? Hiatal hernia 08/06/2015  ? Melanoma of right upper arm ([place]) 08/06/2015  ? Mixed hyperlipidemia 08/06/2015  ? Rupture of right biceps tendon 08/06/2015  ? Biceps tendonitis of both shoulders 08/06/2015  ? ? ?Past Surgical History:  ?Procedure Laterality Date  ? BREAST CYST EXCISION Right 09/1962  ? CATARACT EXTRACTION  03/2015  ? melonoma Right 06/2016  ? arm  ? MOHS SURGERY Left 06/2016  ? nose  ? ? ?OB History   ?No obstetric history on file. ?  ? ? ? ?Home Medications   ? ?Prior to Admission medications   ?Medication Sig Start Date End Date Taking? Authorizing Provider  ?baclofen (LIORESAL) 10 MG tablet Take 1 tablet (10 mg total) by mouth 3 (three) times daily. 12/22/21  Yes Eliezer Lofts, FNP  ?methylPREDNISolone (MEDROL DOSEPAK) 4 MG TBPK tablet Take as directed 12/22/21  Yes Eliezer Lofts, FNP  ?ALPRAZolam (XANAX) 0.25 MG tablet TAKE ONE TABLET (0.'25MG'$  TOTAL) BY MOUTH AT BEDTIME AS NEEDED FOR SLEEP 12/06/21   Hali Marry, MD  ?AMBULATORY NON FORMULARY MEDICATION 20-30 mmHg Knee High Compression Stockings. 05/25/20   Orma Render, NP  ?ammonium lactate (AMLACTIN) 12 % cream Apply topically as needed for dry skin.    [provider]  ?Bevacizumab 2.75 MG/0.11ML SOSY Inject into the eye.    [provider]  ?calcium citrate-vitamin D (CITRACAL+D) 315-200 MG-UNIT tablet Take 1 tablet by mouth 2 (two) times daily.    [provider]  ?celecoxib (CELEBREX) 200 MG capsule 1 to 2 capsules daily 06/01/21   Silverio Decamp, MD  ?COD LIVER OIL PO Take 1 mL by mouth daily.    [provider]  ?doxycycline (VIBRA-TABS) 100 MG tablet Take 1 tablet (100 mg total) by mouth 2 (two) times daily. ?Patient not taking: Reported on 12/22/2021 10/14/21    Hali Marry, MD  ?Flaxseed Oil OIL 1,000 mg.    [provider]  ?fluticasone-salmeterol (ADVAIR) 250-50 MCG/ACT AEPB Inhale 1 puff into the lungs 2 (two) times daily. Needs appointment. 06/24/21   Hali Marry, MD  ?ketoconazole (NIZORAL) 2 % shampoo USE THREE TIMES A WEEK AS DIRECTED 11/12/19   [provider]  ?latanoprost (XALATAN) 0.005 % ophthalmic solution Place 1 drop into both eyes daily. 05/02/16   [provider]  ?lovastatin (MEVACOR) 40 MG tablet TAKE ONE TABLET BY MOUTH NIGHTLY/ labs for further refills 12/13/21   Hali Marry, MD  ?meclizine (ANTIVERT) 25 MG tablet Take 1 tablet (25 mg total) by mouth 3 (three) times daily as needed for dizziness. 08/21/19   Hali Marry, MD  ?Multiple Vitamins-Minerals (PRESERVISION AREDS 2) CAPS Take by mouth.    [provider]  ?MYRBETRIQ 25 MG TB24 tablet Take 1 tablet (25 mg total) by mouth daily. 12/08/21   Hali Marry, MD  ?olmesartan (BENICAR) 5 MG tablet Take 1 tablet (5 mg total) by mouth daily. 11/12/21   Hali Marry, MD  ?omeprazole (PRILOSEC) 40 MG capsule Take 1 capsule (40 mg total) by mouth daily. 01/11/21   Hali Marry, MD  ?predniSONE (DELTASONE) 20 MG tablet Take 2 tablets (40 mg total) by mouth daily with breakfast. ?Patient not taking: Reported on 12/22/2021 10/14/21   Hali Marry, MD  ?sucralfate (CARAFATE) 1 g tablet TAKE 1 TABLET BY MOUTH  TWICE DAILY 06/14/21   Hali Marry, MD  ?timolol (BETIMOL) 0.5 % ophthalmic solution Place 1 drop into the left eye 2 (two) times daily.    [provider]  ?timolol (TIMOPTIC) 0.5 % ophthalmic solution  11/19/19   [provider]  ?triamcinolone cream (KENALOG) 0.1 % Apply 1 application topically 2 (two) times daily as needed. Do not use for more than 2 weeks at a time 10/14/21   Hali Marry, MD  ?TURMERIC PO Take 1,000 mg by mouth.    [provider]  ?UNABLE  TO FIND Right eye injection every 5-6 weeks for Macular Degeneration    [provider]  ?vitamin B-12 (CYANOCOBALAMIN) 1000 MCG tablet Take 1,000 mcg by mouth daily.     [provider]  ? ? ?Family History ?Family History  ?Problem Relation Age of Onset  ? Cancer Mother   ?     pancreatic  ? Cancer Father   ?     liver  ? Hyperlipidemia Sister   ? Macular degeneration Sister   ? Cancer - Prostate Brother   ? Atrial fibrillation Maternal Aunt   ? ? ?Social History ?Social History  ? ?Tobacco Use  ? Smoking status: Never  ? Smokeless tobacco: Never  ?Vaping Use  ? Vaping Use: Never used  ?Substance Use Topics  ? Alcohol use: Yes  ?  Alcohol/week: 1.0 standard  drink  ?  Types: 1 Glasses of wine per week  ?  Comment: occasionally  ? Drug use: No  ? ? ? ?Allergies   ?Aleve [naproxen sodium], Lisinopril, and Azithromycin ? ? ?Review of Systems ?Review of Systems  ?Genitourinary:  Positive for frequency.  ?All other systems reviewed and are negative. ? ? ?Physical Exam ?Triage Vital Signs ?ED Triage Vitals  ?Enc Vitals Group  ?   BP   ?   Pulse   ?   Resp   ?   Temp   ?   Temp src   ?   SpO2   ?   Weight   ?   Height   ?   Head Circumference   ?   Peak Flow   ?   Pain Score   ?   Pain Loc   ?   Pain Edu?   ?   Excl. in [place]?   ? ?No data found. ? ?Updated Vital Signs ?BP 130/90 (BP Location: Left Arm)   Pulse 74   Temp (!) 97.5 ?F (36.4 ?C) (Oral)   Resp 16   Ht '5\' 3"'$  (1.6 m)   Wt 173 lb (78.5 kg)   SpO2 95%   BMI 30.65 kg/m?  ? ? ?Physical Exam ?Vitals and nursing note reviewed.  ?Constitutional:   ?   General: She is not in acute distress. ?   Appearance: Normal appearance. She is obese. She is not ill-appearing.  ?HENT:  ?   Head: Normocephalic and atraumatic.  ?   Right Ear: Tympanic membrane, ear canal and external ear normal.  ?   Left Ear: Tympanic membrane, ear canal and external ear normal.  ?   Mouth/Throat:  ?   Mouth: Mucous membranes are moist.  ?   Pharynx: Oropharynx is clear.   ?Eyes:  ?   Extraocular Movements: Extraocular movements intact.  ?   Conjunctiva/sclera: Conjunctivae normal.  ?   Pupils: Pupils are equal, round, and reactive to light.  ?Cardiovascular:  ?   Rate and Rhythm: N

## 2022-01-01 ENCOUNTER — Other Ambulatory Visit: Payer: Self-pay

## 2022-01-01 ENCOUNTER — Emergency Department
Admission: EM | Admit: 2022-01-01 | Discharge: 2022-01-01 | Disposition: A | Discharge status: Home / Self Care | Payer: Medicare Other | Source: Home / Self Care

## 2022-01-01 DIAGNOSIS — R059 Cough, unspecified: Secondary | ICD-10-CM

## 2022-01-01 LAB — POC SARS CORONAVIRUS 2 AG -  ED: SARS Coronavirus 2 Ag: NEGATIVE

## 2022-01-01 MED ORDER — BENZONATATE 200 MG PO CAPS: 200.0000 mg | ORAL_CAPSULE | Freq: Three times a day (TID) | ORAL | 0 refills | Status: AC | PRN

## 2022-01-01 MED ORDER — DOXYCYCLINE HYCLATE 100 MG PO CAPS: 100.0000 mg | ORAL_CAPSULE | Freq: Two times a day (BID) | ORAL | 0 refills | Status: AC

## 2022-01-01 NOTE — ED Triage Notes (Signed)
Pt states that she has a cough, chest congestion and nasal congestion. X1 week ? ?Pt states that she is vaccinated for covid. ?Pt states that she has had flu vaccine.  ?

## 2022-01-01 NOTE — ED Provider Notes (Signed)
?[place] ? ? ? ?CSN: [id number] ?Arrival date & time: 01/01/22  1410 ? ? ?  ? ?History   ?Chief Complaint ?Chief Complaint  ?Patient presents with  ? Cough  ?  Cough, chest congestion and nasal congestion. X1 week  ? ? ?HPI ?Anna [name] is a 86 y.o. female.  ? ?HPI Very pleasant 86 year old female presents with cough, chest congestion and nasal congestion for 1 week.  PMH significant for BCC, HLD, and chronic fatigue. ? ?Past Medical History:  ?Diagnosis Date  ? Dermatitis   ? seborrehic scalp  ? Hiatal hernia   ? History of basal cell carcinoma 07/20/2017  ? On nose.   ? History of melanoma 07/20/2017  ? Right upper arm   ? Hyperlipidemia   ? Macular degeneration   ? ? ?Patient Active Problem List  ? Diagnosis Date Noted  ? DDD (degenerative disc disease), cervical 05/04/2021  ? Neck pain 05/03/2021  ? Anorexia 06/30/2020  ? Umbilical hernia without obstruction and without gangrene 06/30/2020  ? Meralgia paresthetica of right side 06/22/2020  ? Venous insufficiency of both lower extremities 06/22/2020  ? Primary osteoarthritis, left shoulder 06/03/2020  ? Bilateral lower extremity edema 05/25/2020  ? Aortic atherosclerosis ([place]) 05/13/2020  ? Bunion, right foot 11/22/2019  ? Lichen planus 27/74/1287  ? Asthma in adult 11/22/2019  ? Mixed restrictive and obstructive lung disease ([place]) 11/22/2019  ? Primary osteoarthritis of right knee 11/22/2019  ? Acute pain of left shoulder 09/05/2019  ? Chronic bilateral low back pain without sciatica 09/05/2019  ? OAB (overactive bladder) 08/08/2019  ? Insomnia 08/08/2019  ? History of melanoma 07/20/2017  ? History of basal cell carcinoma 07/20/2017  ? IFG (impaired fasting glucose) 07/17/2017  ? Macular degeneration   ? Chronic fatigue 08/06/2015  ? Gastroesophageal reflux disease without esophagitis 08/06/2015  ? Glaucoma 08/06/2015  ? Hiatal hernia 08/06/2015  ? Melanoma of right upper arm ([place]) 08/06/2015  ? Mixed hyperlipidemia 08/06/2015  ? Rupture of  right biceps tendon 08/06/2015  ? Biceps tendonitis of both shoulders 08/06/2015  ? ? ?Past Surgical History:  ?Procedure Laterality Date  ? BREAST CYST EXCISION Right 09/1962  ? CATARACT EXTRACTION  03/2015  ? melonoma Right 06/2016  ? arm  ? MOHS SURGERY Left 06/2016  ? nose  ? ? ?OB History   ?No obstetric history on file. ?  ? ? ? ?Home Medications   ? ?Prior to Admission medications   ?Medication Sig Start Date End Date Taking? Authorizing Provider  ?ALPRAZolam (XANAX) 0.25 MG tablet TAKE ONE TABLET (0.'25MG'$  TOTAL) BY MOUTH AT BEDTIME AS NEEDED FOR SLEEP 12/06/21  Yes Hali Marry, MD  ?AMBULATORY NON FORMULARY MEDICATION 20-30 mmHg Knee High Compression Stockings. 05/25/20  Yes Early, Coralee Pesa, NP  ?ammonium lactate (AMLACTIN) 12 % cream Apply topically as needed for dry skin.   Yes [provider]  ?baclofen (LIORESAL) 10 MG tablet Take 1 tablet (10 mg total) by mouth 3 (three) times daily. 12/22/21  Yes Eliezer Lofts, FNP  ?benzonatate (TESSALON) 200 MG capsule Take 1 capsule (200 mg total) by mouth 3 (three) times daily as needed for up to 7 days for cough. 01/01/22 01/08/22 Yes Eliezer Lofts, FNP  ?Bevacizumab 2.75 MG/0.11ML SOSY Inject into the eye.   Yes [provider]  ?calcium citrate-vitamin D (CITRACAL+D) 315-200 MG-UNIT tablet Take 1 tablet by mouth 2 (two) times daily.   Yes [provider]  ?celecoxib (CELEBREX) 200 MG capsule 1 to 2  capsules daily 06/01/21  Yes Silverio Decamp, MD  ?COD LIVER OIL PO Take 1 mL by mouth daily.   Yes [provider]  ?doxycycline (VIBRAMYCIN) 100 MG capsule Take 1 capsule (100 mg total) by mouth 2 (two) times daily for 10 days. 01/01/22 01/11/22 Yes Eliezer Lofts, FNP  ?Flaxseed Oil OIL 1,000 mg.   Yes [provider]  ?fluticasone-salmeterol (ADVAIR) 250-50 MCG/ACT AEPB Inhale 1 puff into the lungs 2 (two) times daily. Needs appointment. 06/24/21  Yes Hali Marry, MD  ?ketoconazole (NIZORAL) 2 % shampoo  USE THREE TIMES A WEEK AS DIRECTED 11/12/19  Yes [provider]  ?latanoprost (XALATAN) 0.005 % ophthalmic solution Place 1 drop into both eyes daily. 05/02/16  Yes [provider]  ?lovastatin (MEVACOR) 40 MG tablet TAKE ONE TABLET BY MOUTH NIGHTLY/ labs for further refills 12/13/21  Yes Hali Marry, MD  ?meclizine (ANTIVERT) 25 MG tablet Take 1 tablet (25 mg total) by mouth 3 (three) times daily as needed for dizziness. 08/21/19  Yes Hali Marry, MD  ?methylPREDNISolone (MEDROL DOSEPAK) 4 MG TBPK tablet Take as directed 12/22/21  Yes Eliezer Lofts, [place]  ?Multiple Vitamins-Minerals (PRESERVISION AREDS 2) CAPS Take by mouth.   Yes [provider]  ?MYRBETRIQ 25 MG TB24 tablet Take 1 tablet (25 mg total) by mouth daily. 12/08/21  Yes Hali Marry, MD  ?olmesartan (BENICAR) 5 MG tablet Take 1 tablet (5 mg total) by mouth daily. 11/12/21  Yes Hali Marry, MD  ?omeprazole (PRILOSEC) 40 MG capsule Take 1 capsule (40 mg total) by mouth daily. 01/11/21  Yes Hali Marry, MD  ?sucralfate (CARAFATE) 1 g tablet TAKE 1 TABLET BY MOUTH  TWICE DAILY 06/14/21  Yes Hali Marry, MD  ?timolol (BETIMOL) 0.5 % ophthalmic solution Place 1 drop into the left eye 2 (two) times daily.   Yes [provider]  ?timolol (TIMOPTIC) 0.5 % ophthalmic solution  11/19/19  Yes [provider]  ?triamcinolone cream (KENALOG) 0.1 % Apply 1 application topically 2 (two) times daily as needed. Do not use for more than 2 weeks at a time 10/14/21  Yes Metheney, Rene Kocher, MD  ?TURMERIC PO Take 1,000 mg by mouth.   Yes [provider]  ?UNABLE TO FIND Right eye injection every 5-6 weeks for Macular Degeneration   Yes [provider]  ?vitamin B-12 (CYANOCOBALAMIN) 1000 MCG tablet Take 1,000 mcg by mouth daily.    Yes [provider]  ? ? ?Family History ?Family History  ?Problem Relation Age of Onset  ? Cancer Mother   ?      pancreatic  ? Cancer Father   ?     liver  ? Hyperlipidemia Sister   ? Macular degeneration Sister   ? Cancer - Prostate Brother   ? Atrial fibrillation Maternal Aunt   ? ? ?Social History ?Social History  ? ?Tobacco Use  ? Smoking status: Never  ? Smokeless tobacco: Never  ?Vaping Use  ? Vaping Use: Never used  ?Substance Use Topics  ? Alcohol use: Yes  ?  Alcohol/week: 1.0 standard drink  ?  Types: 1 Glasses of wine per week  ?  Comment: occasionally  ? Drug use: No  ? ? ? ?Allergies   ?Aleve [naproxen sodium], Lisinopril, and Azithromycin ? ? ?Review of Systems ?Review of Systems  ?HENT:  Positive for congestion.   ?Respiratory:  Positive for cough.   ?All other systems reviewed and are negative. ? ? ?  Physical Exam ?Triage Vital Signs ?ED Triage Vitals  ?Enc Vitals Group  ?   BP 01/01/22 1444 (!) 164/94  ?   Pulse Rate 01/01/22 1444 73  ?   Resp 01/01/22 1444 18  ?   Temp --   ?   Temp Source 01/01/22 1444 Oral  ?   SpO2 01/01/22 1444 94 %  ?   Weight 01/01/22 1441 175 lb (79.4 kg)  ?   Height 01/01/22 1441 '5\' 3"'$  (1.6 m)  ?   Head Circumference --   ?   Peak Flow --   ?   Pain Score 01/01/22 1441 0  ?   Pain Loc --   ?   Pain Edu? --   ?   Excl. in [place]? --   ? ?No data found. ? ?Updated Vital Signs ?BP (!) 164/94 (BP Location: Right Arm)   Pulse 73   Resp 18   Ht '5\' 3"'$  (1.6 m)   Wt 175 lb (79.4 kg)   SpO2 94%   BMI 31.00 kg/m?  ? ?  ? ?Physical Exam ?Vitals and nursing note reviewed.  ?Constitutional:   ?   Appearance: Normal appearance. She is normal weight.  ?HENT:  ?   Head: Normocephalic and atraumatic.  ?   Right Ear: Tympanic membrane, ear canal and external ear normal.  ?   Left Ear: Tympanic membrane, ear canal and external ear normal.  ?   Mouth/Throat:  ?   Mouth: Mucous membranes are moist.  ?   Pharynx: Oropharynx is clear.  ?Eyes:  ?   Extraocular Movements: Extraocular movements intact.  ?   Conjunctiva/sclera: Conjunctivae normal.  ?   Pupils: Pupils are equal, round, and reactive to light.   ?Cardiovascular:  ?   Rate and Rhythm: Normal rate and regular rhythm.  ?   Pulses: Normal pulses.  ?   Heart sounds: Normal heart sounds.  ?Pulmonary:  ?   Effort: Pulmonary effort is normal.  ?   Breath sounds: Normal b

## 2022-01-01 NOTE — Discharge Instructions (Addendum)
Advised patient to take medication as directed with food to completion.  Advised patient may take Tessalon [place] daily or as needed for cough.  Encouraged patient to increase daily water intake while taking these medications.  Advised patient if symptoms worsen and/or unresolved please follow-up with PCP or here for further evaluation. ?

## 2022-01-14 ENCOUNTER — Other Ambulatory Visit: Payer: Self-pay | Admitting: Family Medicine

## 2022-01-14 NOTE — Telephone Encounter (Signed)
Patient has been scheduled for 02/07/22 @ 10am. AMUCK ?

## 2022-01-14 NOTE — Telephone Encounter (Signed)
Please call pt and inform her that she will need to come in for OV and labs for refills on Cholesterol medication. Thanks.  ?

## 2022-01-17 ENCOUNTER — Other Ambulatory Visit: Payer: Self-pay | Admitting: Family Medicine

## 2022-01-17 DIAGNOSIS — K219 Gastro-esophageal reflux disease without esophagitis: Secondary | ICD-10-CM

## 2022-01-19 DIAGNOSIS — H353211 Exudative age-related macular degeneration, right eye, with active choroidal neovascularization: Secondary | ICD-10-CM | POA: Diagnosis not present

## 2022-02-01 ENCOUNTER — Other Ambulatory Visit: Payer: Self-pay | Admitting: Sports Medicine

## 2022-02-01 DIAGNOSIS — H353221 Exudative age-related macular degeneration, left eye, with active choroidal neovascularization: Secondary | ICD-10-CM | POA: Diagnosis not present

## 2022-02-01 DIAGNOSIS — M1711 Unilateral primary osteoarthritis, right knee: Secondary | ICD-10-CM

## 2022-02-01 DIAGNOSIS — M19012 Primary osteoarthritis, left shoulder: Secondary | ICD-10-CM

## 2022-02-01 DIAGNOSIS — M159 Polyosteoarthritis, unspecified: Secondary | ICD-10-CM

## 2022-02-07 ENCOUNTER — Encounter: Payer: Self-pay | Admitting: Family Medicine

## 2022-02-07 ENCOUNTER — Ambulatory Visit (INDEPENDENT_AMBULATORY_CARE_PROVIDER_SITE_OTHER): Payer: Medicare Other | Admitting: Family Medicine

## 2022-02-07 VITALS — BP 134/81 | HR 81 | Ht 63.0 in | Wt 178.0 lb

## 2022-02-07 DIAGNOSIS — G8929 Other chronic pain: Secondary | ICD-10-CM

## 2022-02-07 DIAGNOSIS — F418 Other specified anxiety disorders: Secondary | ICD-10-CM | POA: Insufficient documentation

## 2022-02-07 DIAGNOSIS — R7989 Other specified abnormal findings of blood chemistry: Secondary | ICD-10-CM | POA: Diagnosis not present

## 2022-02-07 DIAGNOSIS — R058 Other specified cough: Secondary | ICD-10-CM

## 2022-02-07 DIAGNOSIS — M545 Low back pain, unspecified: Secondary | ICD-10-CM | POA: Diagnosis not present

## 2022-02-07 DIAGNOSIS — I10 Essential (primary) hypertension: Secondary | ICD-10-CM | POA: Diagnosis not present

## 2022-02-07 MED ORDER — ESCITALOPRAM OXALATE 5 MG PO TABS: 5.0000 mg | ORAL_TABLET | Freq: Every morning | ORAL | 0 refills | Status: DC

## 2022-02-07 MED ORDER — OLMESARTAN MEDOXOMIL 20 MG PO TABS: 10.0000 mg | ORAL_TABLET | Freq: Every day | ORAL | 0 refills | Status: DC

## 2022-02-07 NOTE — Assessment & Plan Note (Signed)
We discussed her symptoms and she was open to taking a daily medication to help reduce her symptoms.  We will start with escitalopram 5 mg daily.  Keep follow-up and we will see how she is doing and make adjustments at that time if she has any problems or concerns then please let us know. ?

## 2022-02-07 NOTE — Assessment & Plan Note (Signed)
Discussed starting to do her home exercises again she still has the worksheets at home.  Also okay to overlap with Tylenol if needed occasionally. ?

## 2022-02-07 NOTE — Progress Notes (Signed)
? ?Established Patient Office Visit ? ?Subjective   ?Patient ID: Anna [name], female    DOB: 1936-07-23  Age: 86 y.o. MRN: [id number] ? ?Chief Complaint  ?Patient presents with  ? Hypertension  ? ? ?HPI ? ?Hypertension- Pt denies chest pain, SOB, dizziness, or heart palpitations.  Taking meds as directed w/o problems.  Denies medication side effects.  Bring in a home blood pressure log today.  Most blood pressures in the 140s and low 150s.  Highest blood pressure was 162/93.  Best blood pressure was 128/76.  We had switched her to an ARB when I last saw her because of cough. ? ?She did want to discuss her mood as well she says she has had some problems with anxiety on and off over her life but just feels like as she is gotten older it is just gotten a little worse.  She feels like her emotions to things are little bit more intense and stronger.  She wants to discuss some options.  She does have alprazolam which she uses occasionally but not daily.  She did complete the questionnaire for [place].  PHQ-9 score of 7 and GAD-7 score of 3 today. ? ?He also wanted me to check her lungs today she had significant cough with phlegm production and was recently treated with a round of antibiotics.  She says she feels about 75% better but still having some residual cough and some intermittent sputum production as well she has been using her Advair regularly twice a day.  Still struggling with some low energy.  Not currently exercising because she has been sick. ? ?Is also really been struggling with some low back pain which has now become constant.  She has been taking her Celebrex 100 mg twice a day.  Did PT etc. last year and did get some benefit from that she has not been doing her home exercises recently. ? ? ? ? ?ROS ? ?  ?Objective:  ?  ? ?BP 134/81   Pulse 81   Ht '5\' 3"'$  (1.6 m)   Wt 178 lb (80.7 kg)   SpO2 95%   BMI 31.53 kg/m?  ? ? ?Physical Exam ?Vitals and nursing note reviewed.  ?Constitutional:   ?   Appearance:  She is well-developed.  ?HENT:  ?   Head: Normocephalic and atraumatic.  ?Cardiovascular:  ?   Rate and Rhythm: Normal rate and regular rhythm.  ?   Heart sounds: Normal heart sounds.  ?Pulmonary:  ?   Effort: Pulmonary effort is normal.  ?   Breath sounds: Normal breath sounds.  ?Skin: ?   General: Skin is warm and dry.  ?Neurological:  ?   Mental Status: She is alert and oriented to person, place, and time.  ?Psychiatric:     ?   Behavior: Behavior normal.  ? ? ? ?No results found for any visits on 02/07/22. ? ? ? ?The ASCVD Risk score (Arnett DK, et al., 2019) failed to calculate for the following reasons: ?  The 2019 ASCVD risk score is only valid for ages 19 to 67 ? ?  ?Assessment & Plan:  ? ?Problem List Items Addressed This Visit   ? ?  ? Cardiovascular and Mediastinum  ? Primary hypertension - Primary  ?  We discussed options.  It looked at her log that she brought in from home.  Most of the blood pressures does still consistently above 140 she only had 2 that were at goal.  So we discussed  increasing the omelsartan to 10 mg.  Does not come in a 10 mg so she can split the tab in half of the 20 mg and will see if that gets her blood pressure to goal that we may end up having to increase her to 20 mg.  Keep follow-up.  Due for labs including potassium and renal function. ? ?  ?  ? Relevant Medications  ? olmesartan (BENICAR) 20 MG tablet  ? Other Relevant Orders  ? Lipid Panel w/reflex Direct LDL  ? COMPLETE METABOLIC PANEL WITH GFR  ? CBC  ?  ? Other  ? Chronic bilateral low back pain without sciatica  ?  Discussed starting to do her home exercises again she still has the worksheets at home.  Also okay to overlap with Tylenol if needed occasionally. ? ?  ?  ? Relevant Medications  ? escitalopram (LEXAPRO) 5 MG tablet  ? Anxious depression  ?  We discussed her symptoms and she was open to taking a daily medication to help reduce her symptoms.  We will start with escitalopram 5 mg daily.  Keep follow-up and we  will see how she is doing and make adjustments at that time if she has any problems or concerns then please let us know. ? ?  ?  ? Relevant Medications  ? escitalopram (LEXAPRO) 5 MG tablet  ? ?Other Visit Diagnoses   ? ? Abnormal TSH      ? Relevant Orders  ? TSH  ? Post-viral cough syndrome      ? ?  ? ? ?Postinfectious cough-overall her lungs sound good today and she does feel significantly better do suspect it may take another week or 2 for the cough to completely improve.  Call if any new symptoms or worsening symptoms.  You to use Advair twice a day. ? ?Return in about 5 weeks (around 03/14/2022) for Hypertension and Mood.  ? ? ?Beatrice Lecher, MD ? ?

## 2022-02-07 NOTE — Assessment & Plan Note (Signed)
We discussed options.  It looked at her log that she brought in from home.  Most of the blood pressures does still consistently above 140 she only had 2 that were at goal.  So we discussed increasing the omelsartan to 10 mg.  Does not come in a 10 mg so she can split the tab in half of the 20 mg and will see if that gets her blood pressure to goal that we may end up having to increase her to 20 mg.  Keep follow-up.  Due for labs including potassium and renal function. ?

## 2022-02-08 LAB — LIPID PANEL W/REFLEX DIRECT LDL
Cholesterol: 144 mg/dL (ref <200)
HDL: 51 mg/dL (ref >=50)
LDL Cholesterol (Calc): 73 mg/dL (calc)
Non-HDL Cholesterol (Calc): 93 mg/dL (calc) (ref <130)
Total CHOL/HDL Ratio: 2.8 (calc) (ref <5.0)
Triglycerides: 123 mg/dL (ref <150)

## 2022-02-08 LAB — CBC
HCT: 44.5 % (ref 35.0–45.0)
Hemoglobin: 15 g/dL (ref 11.7–15.5)
MCH: 29.4 pg (ref 27.0–33.0)
MCHC: 33.7 g/dL (ref 32.0–36.0)
MCV: 87.1 fL (ref 80.0–100.0)
MPV: 11.4 fL (ref 7.5–12.5)
Platelets: 145 10*3/uL (ref 140–400)
RBC: 5.11 10*6/uL — ABNORMAL HIGH (ref 3.80–5.10)
RDW: 12.5 % (ref 11.0–15.0)
WBC: 6.7 10*3/uL (ref 3.8–10.8)

## 2022-02-08 LAB — COMPLETE METABOLIC PANEL WITH GFR
AG Ratio: 2 (calc) (ref 1.0–2.5)
ALT: 43 U/L — ABNORMAL HIGH (ref 6–29)
AST: 50 U/L — ABNORMAL HIGH (ref 10–35)
Albumin: 4 g/dL (ref 3.6–5.1)
Alkaline phosphatase (APISO): 105 U/L (ref 37–153)
BUN: 13 mg/dL (ref 7–25)
CO2: 26 mmol/L (ref 20–32)
Calcium: 8.9 mg/dL (ref 8.6–10.4)
Chloride: 105 mmol/L (ref 98–110)
Creat: 0.87 mg/dL (ref 0.60–0.95)
Globulin: 2 g/dL (calc) (ref 1.9–3.7)
Glucose, Bld: 111 mg/dL — ABNORMAL HIGH (ref 65–99)
Potassium: 3.8 mmol/L (ref 3.5–5.3)
Sodium: 142 mmol/L (ref 135–146)
Total Bilirubin: 1.3 mg/dL — ABNORMAL HIGH (ref 0.2–1.2)
Total Protein: 6 g/dL — ABNORMAL LOW (ref 6.1–8.1)
eGFR: 66 mL/min/{1.73_m2} (ref >=60)

## 2022-02-08 LAB — TSH: TSH: 1.49 mIU/L (ref 0.40–4.50)

## 2022-02-08 NOTE — Progress Notes (Signed)
Patient: Protein level is a little borderline low but stable.  Just make sure you are getting enough protein in her diet.  Liver function is elevated which is a little unusual with not seen that for her before.  Its not in a dangerous zone but it is mildly elevated.  I would like to recheck her liver function in 2 weeks to see if it was some type of just acute illness or viral illness etc.  Her blood count looks normal.  No anemia.  Thyroid looks great.  Cholesterol also looks fantastic.

## 2022-02-09 ENCOUNTER — Other Ambulatory Visit: Payer: Self-pay | Admitting: *Deleted

## 2022-02-09 DIAGNOSIS — R7989 Other specified abnormal findings of blood chemistry: Secondary | ICD-10-CM

## 2022-02-14 ENCOUNTER — Other Ambulatory Visit: Payer: Self-pay | Admitting: Family Medicine

## 2022-02-23 DIAGNOSIS — R7989 Other specified abnormal findings of blood chemistry: Secondary | ICD-10-CM | POA: Diagnosis not present

## 2022-02-24 LAB — HEPATIC FUNCTION PANEL
AG Ratio: 1.9 (calc) (ref 1.0–2.5)
ALT: 8 U/L (ref 6–29)
AST: 21 U/L (ref 10–35)
Albumin: 3.9 g/dL (ref 3.6–5.1)
Alkaline phosphatase (APISO): 78 U/L (ref 37–153)
Bilirubin, Direct: 0.1 mg/dL (ref 0.0–0.2)
Globulin: 2.1 g/dL (calc) (ref 1.9–3.7)
Indirect Bilirubin: 0.6 mg/dL (calc) (ref 0.2–1.2)
Total Bilirubin: 0.7 mg/dL (ref 0.2–1.2)
Total Protein: 6 g/dL — ABNORMAL LOW (ref 6.1–8.1)

## 2022-02-24 NOTE — Progress Notes (Signed)
Call patient: Repeat liver enzymes are normal.  That is great.  We will just keep an eye on it may be plan to recheck again in 6 months.

## 2022-03-14 ENCOUNTER — Other Ambulatory Visit: Payer: Self-pay | Admitting: Family Medicine

## 2022-03-15 DIAGNOSIS — H35363 Drusen (degenerative) of macula, bilateral: Secondary | ICD-10-CM | POA: Diagnosis not present

## 2022-03-15 DIAGNOSIS — H35453 Secondary pigmentary degeneration, bilateral: Secondary | ICD-10-CM | POA: Diagnosis not present

## 2022-03-15 DIAGNOSIS — H353231 Exudative age-related macular degeneration, bilateral, with active choroidal neovascularization: Secondary | ICD-10-CM | POA: Diagnosis not present

## 2022-03-15 DIAGNOSIS — Z961 Presence of intraocular lens: Secondary | ICD-10-CM | POA: Diagnosis not present

## 2022-03-17 ENCOUNTER — Ambulatory Visit: Payer: Medicare Other | Admitting: Family Medicine

## 2022-03-17 DIAGNOSIS — H353221 Exudative age-related macular degeneration, left eye, with active choroidal neovascularization: Secondary | ICD-10-CM | POA: Diagnosis not present

## 2022-03-21 DIAGNOSIS — H353211 Exudative age-related macular degeneration, right eye, with active choroidal neovascularization: Secondary | ICD-10-CM | POA: Diagnosis not present

## 2022-03-22 ENCOUNTER — Encounter: Payer: Self-pay | Admitting: Family Medicine

## 2022-03-22 ENCOUNTER — Ambulatory Visit (INDEPENDENT_AMBULATORY_CARE_PROVIDER_SITE_OTHER): Payer: Medicare Other | Admitting: Family Medicine

## 2022-03-22 VITALS — BP 148/86 | HR 78 | Resp 18 | Ht 63.0 in | Wt 178.0 lb

## 2022-03-22 DIAGNOSIS — F418 Other specified anxiety disorders: Secondary | ICD-10-CM | POA: Diagnosis not present

## 2022-03-22 DIAGNOSIS — I10 Essential (primary) hypertension: Secondary | ICD-10-CM | POA: Diagnosis not present

## 2022-03-22 DIAGNOSIS — R052 Subacute cough: Secondary | ICD-10-CM

## 2022-04-04 ENCOUNTER — Ambulatory Visit (INDEPENDENT_AMBULATORY_CARE_PROVIDER_SITE_OTHER): Payer: Medicare Other | Admitting: Sports Medicine

## 2022-04-04 ENCOUNTER — Encounter: Payer: Self-pay | Admitting: Sports Medicine

## 2022-04-04 ENCOUNTER — Ambulatory Visit (INDEPENDENT_AMBULATORY_CARE_PROVIDER_SITE_OTHER): Payer: Medicare Other

## 2022-04-04 DIAGNOSIS — M1711 Unilateral primary osteoarthritis, right knee: Secondary | ICD-10-CM | POA: Diagnosis not present

## 2022-04-04 NOTE — Assessment & Plan Note (Signed)
Pleasant 86 year old female, known knee osteoarthritis, last injected in November 2022, did well until now, recurrence of pain, lateral joint line. Injected again, return to see me as needed.

## 2022-04-04 NOTE — Progress Notes (Signed)
    Procedures performed today:    Procedure: Real-time Ultrasound Guided injection of the right knee Device: Samsung HS60  Verbal informed consent obtained.  Time-out conducted.  Noted no overlying erythema, induration, or other signs of local infection.  Skin prepped in a sterile fashion.  Local anesthesia: Topical Ethyl chloride.  With sterile technique and under real time ultrasound guidance: Effusion noted, 1 cc Kenalog 40, 2 cc lidocaine, 2 cc bupivacaine injected easily Completed without difficulty  Advised to call if fevers/chills, erythema, induration, drainage, or persistent bleeding.  Images permanently stored and available for review in PACS.  Impression: Technically successful ultrasound guided injection.  Independent interpretation of notes and tests performed by another provider:   None.  Brief History, Exam, Impression, and Recommendations:    Primary osteoarthritis of right knee Pleasant 86 year old female, known knee osteoarthritis, last injected in November 2022, did well until now, recurrence of pain, lateral joint line. Injected again, return to see me as needed.  Chronic process with exacerbation and pharmacologic intervention  ____________________________________________ Gwen Her. Dianah Field, M.D., ABFM., CAQSM., AME. Primary Care and Sports Medicine [place] [facility]  Adjunct Professor of [place] of [facility] of Medicine  Risk manager

## 2022-04-05 ENCOUNTER — Ambulatory Visit (INDEPENDENT_AMBULATORY_CARE_PROVIDER_SITE_OTHER): Payer: Medicare Other | Admitting: Family Medicine

## 2022-04-05 VITALS — BP 116/62 | HR 101

## 2022-04-05 DIAGNOSIS — I10 Essential (primary) hypertension: Secondary | ICD-10-CM | POA: Diagnosis not present

## 2022-04-05 NOTE — Progress Notes (Signed)
Okay, hopefully she can come back with fresh batteries.

## 2022-04-05 NOTE — Progress Notes (Signed)
Pt here for nurse BP check.  Pt denies CP, SOB, headaches, dizziness or missed doses of medications.  Pt brought in her home BP machine, but it cut off twice, apparently from dead batteries.  Unable to obtain readings in the office with her machine.  Pt did bring her readings from home, which were placed in Dr. Gardiner Ramus in basket for review.  Charyl Bigger, CMA

## 2022-04-12 ENCOUNTER — Other Ambulatory Visit: Payer: Self-pay | Admitting: Family Medicine

## 2022-04-15 ENCOUNTER — Other Ambulatory Visit: Payer: Self-pay | Admitting: *Deleted

## 2022-04-15 DIAGNOSIS — I10 Essential (primary) hypertension: Secondary | ICD-10-CM

## 2022-04-15 MED ORDER — OLMESARTAN MEDOXOMIL 20 MG PO TABS: 10.0000 mg | ORAL_TABLET | Freq: Every day | ORAL | 1 refills | Status: DC

## 2022-04-18 DIAGNOSIS — H353221 Exudative age-related macular degeneration, left eye, with active choroidal neovascularization: Secondary | ICD-10-CM | POA: Diagnosis not present

## 2022-04-20 DIAGNOSIS — H353211 Exudative age-related macular degeneration, right eye, with active choroidal neovascularization: Secondary | ICD-10-CM | POA: Diagnosis not present

## 2022-05-06 ENCOUNTER — Other Ambulatory Visit: Payer: Self-pay | Admitting: Family Medicine

## 2022-05-06 DIAGNOSIS — F418 Other specified anxiety disorders: Secondary | ICD-10-CM

## 2022-05-09 DIAGNOSIS — H401131 Primary open-angle glaucoma, bilateral, mild stage: Secondary | ICD-10-CM | POA: Diagnosis not present

## 2022-05-12 NOTE — Telephone Encounter (Signed)
Patient LVM about refill. LVM letting her know this was sent.

## 2022-05-16 ENCOUNTER — Other Ambulatory Visit: Payer: Self-pay | Admitting: Family Medicine

## 2022-05-26 DIAGNOSIS — H353221 Exudative age-related macular degeneration, left eye, with active choroidal neovascularization: Secondary | ICD-10-CM | POA: Diagnosis not present

## 2022-05-29 ENCOUNTER — Other Ambulatory Visit: Payer: Self-pay | Admitting: Family Medicine

## 2022-05-29 DIAGNOSIS — K219 Gastro-esophageal reflux disease without esophagitis: Secondary | ICD-10-CM

## 2022-06-01 DIAGNOSIS — H353211 Exudative age-related macular degeneration, right eye, with active choroidal neovascularization: Secondary | ICD-10-CM | POA: Diagnosis not present

## 2022-06-03 ENCOUNTER — Telehealth: Payer: Self-pay | Admitting: Family Medicine

## 2022-06-03 DIAGNOSIS — T17308A Unspecified foreign body in larynx causing other injury, initial encounter: Secondary | ICD-10-CM

## 2022-06-03 DIAGNOSIS — R052 Subacute cough: Secondary | ICD-10-CM

## 2022-06-03 NOTE — Telephone Encounter (Signed)
Please call patient and see if she still struggling with the coughing and choking if she is at like to refer her to [facility] for further evaluation and see if she may have some swallowing issues

## 2022-06-07 ENCOUNTER — Other Ambulatory Visit: Payer: Self-pay | Admitting: *Deleted

## 2022-06-07 DIAGNOSIS — I10 Essential (primary) hypertension: Secondary | ICD-10-CM

## 2022-06-07 MED ORDER — OLMESARTAN MEDOXOMIL 20 MG PO TABS: 20.0000 mg | ORAL_TABLET | Freq: Every day | ORAL | 1 refills | Status: DC

## 2022-06-10 NOTE — Telephone Encounter (Signed)
Attempted to contact pt.  No answer and no VM.  Charyl Bigger, CMA

## 2022-06-13 NOTE — Telephone Encounter (Signed)
Attempted to contact patient . Left voice mail to return our call 06/13/22.

## 2022-06-16 DIAGNOSIS — Z23 Encounter for immunization: Secondary | ICD-10-CM | POA: Diagnosis not present

## 2022-06-16 DIAGNOSIS — W228XXA Striking against or struck by other objects, initial encounter: Secondary | ICD-10-CM | POA: Diagnosis not present

## 2022-06-16 DIAGNOSIS — S8011XA Contusion of right lower leg, initial encounter: Secondary | ICD-10-CM | POA: Diagnosis not present

## 2022-06-16 DIAGNOSIS — Z743 Need for continuous supervision: Secondary | ICD-10-CM | POA: Diagnosis not present

## 2022-06-16 DIAGNOSIS — K219 Gastro-esophageal reflux disease without esophagitis: Secondary | ICD-10-CM | POA: Diagnosis not present

## 2022-06-16 DIAGNOSIS — E785 Hyperlipidemia, unspecified: Secondary | ICD-10-CM | POA: Diagnosis not present

## 2022-06-16 DIAGNOSIS — S81811A Laceration without foreign body, right lower leg, initial encounter: Secondary | ICD-10-CM | POA: Diagnosis not present

## 2022-06-16 DIAGNOSIS — Z881 Allergy status to other antibiotic agents status: Secondary | ICD-10-CM | POA: Diagnosis not present

## 2022-06-16 DIAGNOSIS — R6889 Other general symptoms and signs: Secondary | ICD-10-CM | POA: Diagnosis not present

## 2022-06-16 DIAGNOSIS — Z886 Allergy status to analgesic agent status: Secondary | ICD-10-CM | POA: Diagnosis not present

## 2022-06-16 DIAGNOSIS — S8990XA Unspecified injury of unspecified lower leg, initial encounter: Secondary | ICD-10-CM | POA: Diagnosis not present

## 2022-06-16 DIAGNOSIS — Z79899 Other long term (current) drug therapy: Secondary | ICD-10-CM | POA: Diagnosis not present

## 2022-06-19 ENCOUNTER — Other Ambulatory Visit: Payer: Self-pay | Admitting: Family Medicine

## 2022-06-19 DIAGNOSIS — J4540 Moderate persistent asthma, uncomplicated: Secondary | ICD-10-CM

## 2022-06-29 DIAGNOSIS — H353221 Exudative age-related macular degeneration, left eye, with active choroidal neovascularization: Secondary | ICD-10-CM | POA: Diagnosis not present

## 2022-06-30 ENCOUNTER — Ambulatory Visit (INDEPENDENT_AMBULATORY_CARE_PROVIDER_SITE_OTHER): Payer: Medicare Other

## 2022-06-30 ENCOUNTER — Encounter: Payer: Self-pay | Admitting: Sports Medicine

## 2022-06-30 ENCOUNTER — Ambulatory Visit (INDEPENDENT_AMBULATORY_CARE_PROVIDER_SITE_OTHER): Payer: Medicare Other | Admitting: Sports Medicine

## 2022-06-30 DIAGNOSIS — M5030 Other cervical disc degeneration, unspecified cervical region: Secondary | ICD-10-CM

## 2022-06-30 DIAGNOSIS — S81811A Laceration without foreign body, right lower leg, initial encounter: Secondary | ICD-10-CM

## 2022-06-30 DIAGNOSIS — M542 Cervicalgia: Secondary | ICD-10-CM

## 2022-06-30 DIAGNOSIS — M47812 Spondylosis without myelopathy or radiculopathy, cervical region: Secondary | ICD-10-CM | POA: Diagnosis not present

## 2022-06-30 NOTE — Progress Notes (Addendum)
    Procedures performed today:    None.  Independent interpretation of notes and tests performed by another provider:   None.  Brief History, Exam, Impression, and Recommendations:      Noninfected skin tear of leg, right, initial encounter This is a very pleasant 86 year old female, 2 weeks ago she had an accidental injury, she bumped her right shin into an object, ended up with a fairly large skin tear and laceration, she was seen in the emergency department, this was stitched to the best of the ability in the emergency department, she was treated with Keflex, she got a Tdap, and she was referred back to [place], she still has a very large skin defect down to the subcutaneous tissues anterior shin on the right. 5 sutures were removed, there is no procedure note available from the emergency department encounter so I am unsure how many sutures were placed. We applied a DuoDERM dressing, I do think she is going to need wound care for this considering the size of the wound and the likely need for healing by secondary intention. I would like to see her back after a month of wound care.  DDD (degenerative disc disease), cervical Also wanted to discuss her neck, we have treated her in the past, baclofen and Celebrex were effective, she is having a recurrence of pain after complete relief with conservative treatment, we will start her out with just updated x-rays and home physical therapy, return to see me in a month and we can get more aggressive with formal PT and dry needling if not better.    ____________________________________________ Gwen Her. Dianah Field, M.D., ABFM., CAQSM., AME. Primary Care and Sports Medicine [place] [facility]  Adjunct Professor of [place] of [facility] of Medicine  Risk manager

## 2022-06-30 NOTE — Assessment & Plan Note (Addendum)
This is a very pleasant 86 year old female, 2 weeks ago she had an accidental injury, she bumped her right shin into an object, ended up with a fairly large skin tear and laceration, she was seen in the emergency department, this was stitched to the best of the ability in the emergency department, she was treated with Keflex, she got a Tdap, and she was referred back to [place], she still has a very large skin defect down to the subcutaneous tissues anterior shin on the right. 5 sutures were removed, there is no procedure note available from the emergency department encounter so I am unsure how many sutures were placed. We applied a DuoDERM dressing, I do think she is going to need wound care for this considering the size of the wound and the likely need for healing by secondary intention. I would like to see her back after a month of wound care.

## 2022-06-30 NOTE — Assessment & Plan Note (Signed)
Also wanted to discuss her neck, we have treated her in the past, baclofen and Celebrex were effective, she is having a recurrence of pain after complete relief with conservative treatment, we will start her out with just updated x-rays and home physical therapy, return to see me in a month and we can get more aggressive with formal PT and dry needling if not better.

## 2022-06-30 NOTE — Addendum Note (Signed)
Addended by: Silverio Decamp on: 06/30/2022 10:14 AM   Modules accepted: Orders, Level of Service

## 2022-07-01 NOTE — Telephone Encounter (Signed)
Attempted call to patient. Again left a voice mail message requesting a return call.

## 2022-07-01 NOTE — Telephone Encounter (Signed)
Finally spoke with patient, she is willing to see GI and have testing done. She would prefer to stay in [place] if possible.  She is aware that the specialist office will call for scheduling. Please place order.

## 2022-07-02 ENCOUNTER — Other Ambulatory Visit: Payer: Self-pay

## 2022-07-02 ENCOUNTER — Ambulatory Visit
Admission: EM | Admit: 2022-07-02 | Discharge: 2022-07-02 | Disposition: A | Discharge status: Home / Self Care | Payer: Medicare Other | Attending: Family Medicine | Admitting: Family Medicine

## 2022-07-02 DIAGNOSIS — L03115 Cellulitis of right lower limb: Secondary | ICD-10-CM

## 2022-07-02 MED ORDER — DOXYCYCLINE HYCLATE 100 MG PO CAPS: 100.0000 mg | ORAL_CAPSULE | Freq: Two times a day (BID) | ORAL | 0 refills | Status: AC

## 2022-07-02 NOTE — ED Triage Notes (Signed)
Pt c/o wound that is bleeding that started today. Wound was caused by recliner level and states it went to bone. Had stitches were removed Thurs am by Dr T next door. Finished abx Thursday. (10 day course) Started bleeding today. Is supposed to be set up with wound care next week.

## 2022-07-02 NOTE — Discharge Instructions (Addendum)
Advised patient to take medication as directed with food to completion.  Encouraged patient to increase daily water intake while taking this medication.  Advised patient to follow-up with PCP first thing on Monday morning, 07/04/2020 for wound care consult appointment.

## 2022-07-02 NOTE — ED Provider Notes (Signed)
[facility]    CSN: [id number] Arrival date & time: 07/02/22  1432      History   Chief Complaint Chief Complaint  Patient presents with   Wound Check    HPI Anna [name] is a 86 y.o. female.   HPI 86 year old female presents with right lower leg wound that began bleeding earlier today.  Patient had laceration repair on 06/16/2022 of this area and stitches removed on 06/30/2022.  Past Medical History:  Diagnosis Date   Dermatitis    seborrehic scalp   Hiatal hernia    History of basal cell carcinoma 07/20/2017   On nose.    History of melanoma 07/20/2017   Right upper arm    Hyperlipidemia    Macular degeneration     Patient Active Problem List   Diagnosis Date Noted   Noninfected skin tear of leg, right, initial encounter 06/30/2022   Primary hypertension 02/07/2022   Anxious depression 02/07/2022   DDD (degenerative disc disease), cervical 05/04/2021   Neck pain 05/03/2021   Anorexia 29/52/8413   Umbilical hernia without obstruction and without gangrene 06/30/2020   Meralgia paresthetica of right side 06/22/2020   Venous insufficiency of both lower extremities 06/22/2020   Primary osteoarthritis, left shoulder 06/03/2020   Bilateral lower extremity edema 05/25/2020   Aortic atherosclerosis ([place]) 05/13/2020   Bunion, right foot 24/40/1027   Lichen planus 25/36/6440   Asthma in adult 11/22/2019   Mixed restrictive and obstructive lung disease ([place]) 11/22/2019   Primary osteoarthritis of right knee 11/22/2019   Acute pain of left shoulder 09/05/2019   Chronic bilateral low back pain without sciatica 09/05/2019   OAB (overactive bladder) 08/08/2019   Insomnia 08/08/2019   History of melanoma 07/20/2017   History of basal cell carcinoma 07/20/2017   IFG (impaired fasting glucose) 07/17/2017   Macular degeneration    Chronic fatigue 08/06/2015   Gastroesophageal reflux disease without esophagitis 08/06/2015   Glaucoma 08/06/2015   Hiatal hernia  08/06/2015   Melanoma of right upper arm ([place]) 08/06/2015   Mixed hyperlipidemia 08/06/2015   Rupture of right biceps tendon 08/06/2015   Biceps tendonitis of both shoulders 08/06/2015    Past Surgical History:  Procedure Laterality Date   BREAST CYST EXCISION Right 09/1962   CATARACT EXTRACTION  03/2015   melonoma Right 06/2016   arm   MOHS SURGERY Left 06/2016   nose    OB History   No obstetric history on file.      Home Medications    Prior to Admission medications   Medication Sig Start Date End Date Taking? Authorizing Provider  doxycycline (VIBRAMYCIN) 100 MG capsule Take 1 capsule (100 mg total) by mouth 2 (two) times daily for 10 days. 07/02/22 07/12/22 Yes Eliezer Lofts, FNP  ALPRAZolam Duanne Moron) 0.25 MG tablet TAKE ONE TABLET (0.'25MG'$  TOTAL) BY MOUTH AT BEDTIME AS NEEDED FOR SLEEP 12/06/21   Hali Marry, MD  ammonium lactate (AMLACTIN) 12 % cream Apply topically as needed for dry skin.    [provider]  Apoaequorin (PREVAGEN) 10 MG CAPS Take by mouth.    [provider]  Bevacizumab 2.75 MG/0.11ML SOSY Inject into the eye.    [provider]  calcium citrate-vitamin D (CITRACAL+D) 315-200 MG-UNIT tablet Take 1 tablet by mouth 2 (two) times daily.    [provider]  celecoxib (CELEBREX) 200 MG capsule TAKE 1-2 CAPSULES BY MOUTH EVERY DAY 02/01/22   Silverio Decamp, MD  escitalopram (LEXAPRO) 5 MG tablet Take  1 tablet (5 mg total) by mouth in the morning. 05/12/22   Hali Marry, MD  Flaxseed Oil OIL 1,000 mg.    [provider]  fluticasone-salmeterol (ADVAIR) 250-50 MCG/ACT AEPB Inhale 1 puff into the lungs 2 (two) times daily. 06/21/22   Hali Marry, MD  latanoprost (XALATAN) 0.005 % ophthalmic solution Place 1 drop into both eyes daily. 05/02/16   [provider]  lovastatin (MEVACOR) 40 MG tablet TAKE ONE TABLET BY MOUTH NIGHTLY/ labs for further refills 04/12/22   Hali Marry, MD  meclizine (ANTIVERT) 25 MG tablet Take 1 tablet (25 mg total) by mouth 3 (three) times daily as needed for dizziness. 08/21/19   Hali Marry, MD  Multiple Vitamins-Minerals (PRESERVISION AREDS 2) CAPS Take by mouth.    [provider]  MYRBETRIQ 25 MG TB24 tablet Take 1 tablet (25 mg total) by mouth daily. (NEEDS APPOINTMENT BEFORE MORE REFILLS) 05/16/22   Hali Marry, MD  olmesartan (BENICAR) 20 MG tablet Take 1 tablet (20 mg total) by mouth daily. 06/07/22   Hali Marry, MD  omeprazole (PRILOSEC) 40 MG capsule Take 1 capsule (40 mg total) by mouth daily. 01/17/22   Hali Marry, MD  sucralfate (CARAFATE) 1 g tablet TAKE 1 TABLET BY MOUTH  TWICE DAILY 06/02/22   Hali Marry, MD  timolol (BETIMOL) 0.5 % ophthalmic solution Place 1 drop into the left eye 2 (two) times daily.    [provider]  triamcinolone cream (KENALOG) 0.1 % Apply 1 application topically 2 (two) times daily as needed. Do not use for more than 2 weeks at a time 10/14/21   Hali Marry, MD  TURMERIC PO Take 1,000 mg by mouth.    [provider]  vitamin B-12 (CYANOCOBALAMIN) 1000 MCG tablet Take 1,000 mcg by mouth daily.     [provider]    Family History Family History  Problem Relation Age of Onset   Cancer Mother        pancreatic   Cancer Father        liver   Hyperlipidemia Sister    Macular degeneration Sister    Cancer - Prostate Brother    Atrial fibrillation Maternal Aunt     Social History Social History   Tobacco Use   Smoking status: Never   Smokeless tobacco: Never  Vaping Use   Vaping Use: Never used  Substance Use Topics   Alcohol use: Yes    Alcohol/week: 1.0 standard drink of alcohol    Types: 1 Glasses of wine per week    Comment: occasionally   Drug use: No     Allergies   Aleve [naproxen sodium], Lisinopril, and Azithromycin   Review of Systems Review of Systems  Skin:  Positive for  wound.  All other systems reviewed and are negative.    Physical Exam Triage Vital Signs ED Triage Vitals  Enc Vitals Group     BP 07/02/22 1446 (!) 176/96     Pulse Rate 07/02/22 1446 87     Resp 07/02/22 1446 18     Temp 07/02/22 1446 97.9 F (36.6 C)     Temp Source 07/02/22 1446 Oral     SpO2 07/02/22 1446 96 %     Weight --      Height --      Head Circumference --      Peak Flow --      Pain Score 07/02/22 1447 0  Pain Loc --      Pain Edu? --      Excl. in [place]? --    No data found.  Updated Vital Signs BP (!) 176/96 (BP Location: Right Arm)   Pulse 87   Temp 97.9 F (36.6 C) (Oral)   Resp 18   SpO2 96%   Physical Exam Vitals and nursing note reviewed.  Constitutional:      Appearance: Normal appearance. She is normal weight.  HENT:     Head: Normocephalic and atraumatic.     Mouth/Throat:     Mouth: Mucous membranes are moist.     Pharynx: Oropharynx is clear.  Eyes:     Extraocular Movements: Extraocular movements intact.     Conjunctiva/sclera: Conjunctivae normal.     Pupils: Pupils are equal, round, and reactive to light.  Cardiovascular:     Rate and Rhythm: Normal rate and regular rhythm.     Pulses: Normal pulses.     Heart sounds: Normal heart sounds.  Pulmonary:     Effort: Pulmonary effort is normal.     Breath sounds: Normal breath sounds. No wheezing, rhonchi or rales.  Musculoskeletal:        General: Normal range of motion.     Cervical back: Normal range of motion and neck supple.  Skin:    General: Skin is warm and dry.     Comments: Wound of right lower leg Ace wrap removed Tegaderm still intact and not removed, scattered erythema noted around Tegaderm wound borders  Neurological:     General: No focal deficit present.     Mental Status: She is alert and oriented to person, place, and time.       UC Treatments / Results  Labs (all labs ordered are listed, but only abnormal results are displayed) Labs Reviewed - No data  to display  EKG   Radiology No results found.  Procedures Procedures (including critical care time)  Medications Ordered in UC Medications - No data to display  Initial Impression / Assessment and Plan / UC Course  I have reviewed the triage vital signs and the nursing notes.  Pertinent labs & imaging results that were available during my care of the patient were reviewed by me and considered in my medical decision making (see chart for details).     MDM: 1.  Cellulitis of right lower leg-Rx'd Doxycycline. Advised patient to take medication as directed with food to completion.  Encouraged patient to increase daily water intake while taking this medication.  Advised patient to follow-up with PCP first thing on Monday morning, 07/04/2020 for wound care consult appointment ASAP. Final Clinical Impressions(s) / UC Diagnoses   Final diagnoses:  Cellulitis of right lower leg     Discharge Instructions      Advised patient to take medication as directed with food to completion.  Encouraged patient to increase daily water intake while taking this medication.  Advised patient to follow-up with PCP first thing on Monday morning, 07/04/2020 for wound care consult appointment.     ED Prescriptions     Medication Sig Dispense Auth. Provider   doxycycline (VIBRAMYCIN) 100 MG capsule Take 1 capsule (100 mg total) by mouth 2 (two) times daily for 10 days. 20 capsule Eliezer Lofts, FNP      PDMP not reviewed this encounter.   Eliezer Lofts, [place] 07/02/22 1531

## 2022-07-04 ENCOUNTER — Ambulatory Visit: Payer: Medicare Other | Admitting: Sports Medicine

## 2022-07-04 ENCOUNTER — Encounter: Payer: Self-pay | Admitting: Sports Medicine

## 2022-07-04 DIAGNOSIS — S81811A Laceration without foreign body, right lower leg, initial encounter: Secondary | ICD-10-CM

## 2022-07-04 NOTE — Assessment & Plan Note (Signed)
This is a very pleasant 86 year old female, she returns for further evaluation of a skin tear, we applied a hydrocolloid DuoDERM dressing, she was then seen in urgent care, it had bled through, she is referred back to me, today the wound looks good, fairly similar in size but excellent granulation tissue, no signs of bacterial superinfection. Still somewhat tender, we applied a Mepilex with silver dressing, she will get a little bit better hemostasis with the silver component. Also given her an extra DuoDERM hydrocolloid dressing to use if it soaks through with blood, we applied a pressure dressing, still awaiting wound care. Return to see [place] in a nurse visit in 1 week.

## 2022-07-04 NOTE — Addendum Note (Signed)
Addended by: Beatrice Lecher D on: 07/04/2022 07:22 PM   Modules accepted: Orders

## 2022-07-04 NOTE — Progress Notes (Signed)
    Procedures performed today:    None.  Independent interpretation of notes and tests performed by another provider:   None.  Brief History, Exam, Impression, and Recommendations:      Noninfected skin tear of leg, right, initial encounter This is a very pleasant 86 year old female, she returns for further evaluation of a skin tear, we applied a hydrocolloid DuoDERM dressing, she was then seen in urgent care, it had bled through, she is referred back to me, today the wound looks good, fairly similar in size but excellent granulation tissue, no signs of bacterial superinfection. Still somewhat tender, we applied a Mepilex with silver dressing, she will get a little bit better hemostasis with the silver component. Also given her an extra DuoDERM hydrocolloid dressing to use if it soaks through with blood, we applied a pressure dressing, still awaiting wound care. Return to see [place] in a nurse visit in 1 week.    ____________________________________________ Gwen Her. Dianah Field, M.D., ABFM., CAQSM., AME. Primary Care and Sports Medicine [place] [facility]  Adjunct Professor of [place] of [facility] of Medicine  Risk manager

## 2022-07-04 NOTE — Telephone Encounter (Signed)
Orders Placed This Encounter  Procedures  . Ambulatory referral to Gastroenterology    Referral Priority:  Routine    Referral Type:  Consultation    Referral Reason:  Specialty Services Required    Number of Visits Requested:  1     

## 2022-07-05 DIAGNOSIS — H353211 Exudative age-related macular degeneration, right eye, with active choroidal neovascularization: Secondary | ICD-10-CM | POA: Diagnosis not present

## 2022-07-11 ENCOUNTER — Other Ambulatory Visit: Payer: Self-pay | Admitting: Family Medicine

## 2022-07-11 ENCOUNTER — Ambulatory Visit (INDEPENDENT_AMBULATORY_CARE_PROVIDER_SITE_OTHER): Payer: Medicare Other | Admitting: Sports Medicine

## 2022-07-11 DIAGNOSIS — S81811A Laceration without foreign body, right lower leg, initial encounter: Secondary | ICD-10-CM

## 2022-07-11 NOTE — Assessment & Plan Note (Signed)
This very pleasant 86 year old female returns, seeing her for further evaluation of a skin tear, we initially applied a hydrocolloid DuoDERM dressing, it bled through, we switch to Mepilex with silver, she did extremely well, the wound looks a lot better today, I am seeing some fibroblast type retractive changes. We are entering the third week of wound care, we had another Mepilex dressing today after cleaning with chlorhexidine, return to see [place] in a week and a nurse visit.

## 2022-07-11 NOTE — Progress Notes (Signed)
    Procedures performed today:    None.  Independent interpretation of notes and tests performed by another provider:   None.  Brief History, Exam, Impression, and Recommendations:    Noninfected skin tear of leg, right, initial encounter This very pleasant 86 year old female returns, seeing her for further evaluation of a skin tear, we initially applied a hydrocolloid DuoDERM dressing, it bled through, we switch to Mepilex with silver, she did extremely well, the wound looks a lot better today, I am seeing some fibroblast type retractive changes. We are entering the third week of wound care, we had another Mepilex dressing today after cleaning with chlorhexidine, return to see [place] in a week and a nurse visit.    ____________________________________________ Gwen Her. Dianah Field, M.D., ABFM., CAQSM., AME. Primary Care and Sports Medicine [place] [facility]  Adjunct Professor of [place] of [facility] of Medicine  Risk manager

## 2022-07-18 ENCOUNTER — Ambulatory Visit (INDEPENDENT_AMBULATORY_CARE_PROVIDER_SITE_OTHER): Payer: Medicare Other | Admitting: Sports Medicine

## 2022-07-18 DIAGNOSIS — S81811A Laceration without foreign body, right lower leg, initial encounter: Secondary | ICD-10-CM | POA: Diagnosis not present

## 2022-07-18 NOTE — Assessment & Plan Note (Signed)
Very pleasant 86 year old female, we have been treating her now for a full-thickness skin tear right lower leg. We have been doing weekly Mepilex with silver dressings. Reapplied today, things are looking much better. Weekly Mepilex Ag dressings.

## 2022-07-18 NOTE — Progress Notes (Signed)
    Procedures performed today:    None.  Independent interpretation of notes and tests performed by another provider:   None.  Brief History, Exam, Impression, and Recommendations:      Noninfected skin tear of leg, right, initial encounter Very pleasant 86 year old female, we have been treating her now for a full-thickness skin tear right lower leg. We have been doing weekly Mepilex with silver dressings. Reapplied today, things are looking much better. Weekly Mepilex Ag dressings.    ____________________________________________ Gwen Her. Dianah Field, M.D., ABFM., CAQSM., AME. Primary Care and Sports Medicine [place] [facility]  Adjunct Professor of [place] of [facility] of Medicine  Risk manager

## 2022-07-21 DIAGNOSIS — J45909 Unspecified asthma, uncomplicated: Secondary | ICD-10-CM | POA: Diagnosis not present

## 2022-07-21 DIAGNOSIS — K219 Gastro-esophageal reflux disease without esophagitis: Secondary | ICD-10-CM | POA: Diagnosis not present

## 2022-07-21 DIAGNOSIS — R1319 Other dysphagia: Secondary | ICD-10-CM | POA: Diagnosis not present

## 2022-07-25 ENCOUNTER — Ambulatory Visit (INDEPENDENT_AMBULATORY_CARE_PROVIDER_SITE_OTHER): Payer: Medicare Other | Admitting: Sports Medicine

## 2022-07-25 DIAGNOSIS — S81811A Laceration without foreign body, right lower leg, initial encounter: Secondary | ICD-10-CM | POA: Diagnosis not present

## 2022-07-25 NOTE — Progress Notes (Signed)
    Procedures performed today:    None.  Independent interpretation of notes and tests performed by another provider:   None.  Brief History, Exam, Impression, and Recommendations:                             07/04/2022      07/25/2022   Noninfected skin tear of leg, right, initial encounter Anna [name] returns, she is a pleasant 86 year old female, we have been treating [name] for a full-thickness skin tear right lower leg, she is approximately 3 to 4 weeks out, things are looking really good, the wound is almost completely closed, maybe 2 cm left to go. We are out of Mepilex Ag so I did use a DuoDERM today however in the future we should use Mepilex Ag if available.    ____________________________________________ Anna [name]. Anna [name], M.D., ABFM., CAQSM., AME. Primary Care and Sports Medicine [place] [facility]  Adjunct Professor of [place] of [facility] of Medicine  Risk manager

## 2022-07-25 NOTE — Progress Notes (Signed)
Pt here for wound check to her R lower leg. Area was cleaned and is healing well.   Dr. Dianah Field came in and looked at dressed the wound.

## 2022-07-25 NOTE — Assessment & Plan Note (Signed)
Anna [name] returns, she is a pleasant 86 year old female, we have been treating her for a full-thickness skin tear right lower leg, she is approximately 3 to 4 weeks out, things are looking really good, the wound is almost completely closed, maybe 2 cm left to go. We are out of Mepilex Ag so I did use a DuoDERM today however in the future we should use Mepilex Ag if available.

## 2022-07-28 ENCOUNTER — Ambulatory Visit: Payer: Medicare Other | Admitting: Sports Medicine

## 2022-08-03 DIAGNOSIS — H353221 Exudative age-related macular degeneration, left eye, with active choroidal neovascularization: Secondary | ICD-10-CM | POA: Diagnosis not present

## 2022-08-04 ENCOUNTER — Other Ambulatory Visit: Payer: Self-pay | Admitting: Family Medicine

## 2022-08-04 ENCOUNTER — Ambulatory Visit: Payer: Medicare Other | Admitting: Sports Medicine

## 2022-08-04 DIAGNOSIS — S81811A Laceration without foreign body, right lower leg, initial encounter: Secondary | ICD-10-CM | POA: Diagnosis not present

## 2022-08-04 DIAGNOSIS — Z1231 Encounter for screening mammogram for malignant neoplasm of breast: Secondary | ICD-10-CM

## 2022-08-04 NOTE — Assessment & Plan Note (Addendum)
We are now 4 weeks into wound care of a noninfected skin tear right lower leg, she was getting Mepilex Ag dressings, we ran out of this and just use DuoDERM, the lesion has continued to close up, it is very small now, I think we can switch to just a Band-Aid with some antibiotic ointment. Return to see me in a month for final check and I think that will be the last visit.Anna [name]

## 2022-08-04 NOTE — Progress Notes (Signed)
    Procedures performed today:    None.  Independent interpretation of notes and tests performed by another provider:   None.  Brief History, Exam, Impression, and Recommendations:     07/04/22    08/04/22    Noninfected skin tear of leg, right, initial encounter We are now 4 weeks into wound care of a noninfected skin tear right lower leg, she was getting Mepilex Ag dressings, we ran out of this and just use DuoDERM, the lesion has continued to close up, it is very small now, I think we can switch to just a Band-Aid with some antibiotic ointment. Return to see me in a month for final check and I think that will be the last visit..    ____________________________________________ Gwen Her. Dianah Field, M.D., ABFM., CAQSM., AME. Primary Care and Sports Medicine [facility]  Adjunct Professor of [employer] [facility] [employer] Medicine  Risk manager

## 2022-08-08 ENCOUNTER — Other Ambulatory Visit: Payer: Self-pay | Admitting: Family Medicine

## 2022-08-08 DIAGNOSIS — F418 Other specified anxiety disorders: Secondary | ICD-10-CM

## 2022-08-09 DIAGNOSIS — R1319 Other dysphagia: Secondary | ICD-10-CM | POA: Diagnosis not present

## 2022-08-09 DIAGNOSIS — K219 Gastro-esophageal reflux disease without esophagitis: Secondary | ICD-10-CM | POA: Diagnosis not present

## 2022-08-09 DIAGNOSIS — K449 Diaphragmatic hernia without obstruction or gangrene: Secondary | ICD-10-CM | POA: Diagnosis not present

## 2022-08-10 DIAGNOSIS — H353211 Exudative age-related macular degeneration, right eye, with active choroidal neovascularization: Secondary | ICD-10-CM | POA: Diagnosis not present

## 2022-08-17 ENCOUNTER — Encounter: Payer: Self-pay | Admitting: Family Medicine

## 2022-08-17 ENCOUNTER — Ambulatory Visit (INDEPENDENT_AMBULATORY_CARE_PROVIDER_SITE_OTHER): Payer: Medicare Other

## 2022-08-17 ENCOUNTER — Ambulatory Visit (INDEPENDENT_AMBULATORY_CARE_PROVIDER_SITE_OTHER): Payer: Medicare Other | Admitting: Family Medicine

## 2022-08-17 VITALS — BP 132/76 | HR 81 | Ht 62.0 in | Wt 179.0 lb

## 2022-08-17 DIAGNOSIS — R053 Chronic cough: Secondary | ICD-10-CM

## 2022-08-17 DIAGNOSIS — R059 Cough, unspecified: Secondary | ICD-10-CM | POA: Diagnosis not present

## 2022-08-17 MED ORDER — IPRATROPIUM BROMIDE 0.03 % NA SOLN: 2.0000 | Freq: Two times a day (BID) | NASAL | 1 refills | Status: DC

## 2022-08-17 NOTE — Progress Notes (Signed)
Acute Office Visit  Subjective:     Patient ID: Anna [name], female    DOB: 1936-03-21, 86 y.o.   MRN: [id number]  Chief Complaint  Patient presents with   Cough    HPI Patient is in today for he states that she has had this cough for the past 2-3 months. The cough on/off, it's dry, happens at different times of the day, denies any sick contacts,no f/s/c.  She says it started after she had a pretty severe respiratory infection a couple of months ago.  She has not had any swallowing difficulty or dysphagia.  No wheezing.  No chest pain.  She says the cough does not even occur daily it just seems to be sporadic.  No sore throat.  She says that the coughing is intense when it happens to the point that she actually feels like she is going to vomit.  She does have a history of GERD and just had an upper endoscopy.  She is on omeprazole 40 mg daily in the morning.  Her Carafate was discontinued.   ROS      Objective:    BP 132/76   Pulse 81   Ht '5\' 2"'$  (1.575 m)   Wt 179 lb (81.2 kg)   SpO2 98%   BMI 32.74 kg/m    Physical Exam Constitutional:      Appearance: She is well-developed.  HENT:     Head: Normocephalic and atraumatic.     Right Ear: External ear normal.     Left Ear: External ear normal.     Nose: Nose normal.  Eyes:     Conjunctiva/sclera: Conjunctivae normal.     Pupils: Pupils are equal, round, and reactive to light.  Neck:     Thyroid: No thyromegaly.  Cardiovascular:     Rate and Rhythm: Normal rate and regular rhythm.     Heart sounds: Normal heart sounds.  Pulmonary:     Effort: Pulmonary effort is normal.     Breath sounds: Normal breath sounds. No wheezing.     Comments: Some coarse BS at the bases bilaterally Musculoskeletal:     Cervical back: Neck supple.  Lymphadenopathy:     Cervical: No cervical adenopathy.  Skin:    General: Skin is warm and dry.  Neurological:     Mental Status: She is alert and oriented to person, place, and time.      No results found for any visits on 08/17/22.      Assessment & Plan:   Problem List Items Addressed This Visit   None Visit Diagnoses     Chronic cough    -  Primary   Relevant Orders   DG Chest 2 View       Chronic cough-it sounds like it coming more from the throat and upper respiratory area.  It hits sporadically but is intense enough that it makes her feel like she is going to vomit.  We discussed common causes including postnasal drip and GERD.  She already takes omeprazole 40 mg daily in the mornings.  We discussed doing a trial of Atrovent nasal spray for 2 to 3 weeks to see if that helps and if not then we will try bumping up her PPI to twice a day and see if that helps.  We did discuss at least getting a chest x-ray today just to make sure there is nothing else going on.  Meds ordered this encounter  Medications   ipratropium (ATROVENT) 0.03 %  nasal spray    Sig: Place 2 sprays into both nostrils every 12 (twelve) hours.    Dispense:  30 mL    Refill:  1    Return in about 2 months (around 10/17/2022) for Mood and BP .  Beatrice Lecher, MD

## 2022-08-17 NOTE — Progress Notes (Signed)
She states that she has had this cough for the past 2-3 months. The cough on/off, it's dry, happens at different times of the day, denies any sick contacts,no f/s/c.

## 2022-08-22 ENCOUNTER — Other Ambulatory Visit: Payer: Self-pay | Admitting: Sports Medicine

## 2022-08-22 DIAGNOSIS — M159 Polyosteoarthritis, unspecified: Secondary | ICD-10-CM

## 2022-08-22 DIAGNOSIS — M1711 Unilateral primary osteoarthritis, right knee: Secondary | ICD-10-CM

## 2022-08-22 DIAGNOSIS — M19012 Primary osteoarthritis, left shoulder: Secondary | ICD-10-CM

## 2022-08-22 NOTE — Progress Notes (Signed)
Call patient: Chest x-ray normal very reassuring even though she had a cough for couple months.  Is continue with a trial of medication as we discussed.

## 2022-08-30 ENCOUNTER — Ambulatory Visit (INDEPENDENT_AMBULATORY_CARE_PROVIDER_SITE_OTHER): Payer: Medicare Other | Admitting: Family Medicine

## 2022-08-30 DIAGNOSIS — Z0000 Encounter for general adult medical examination without abnormal findings: Secondary | ICD-10-CM | POA: Diagnosis not present

## 2022-08-30 DIAGNOSIS — Z780 Asymptomatic menopausal state: Secondary | ICD-10-CM | POA: Diagnosis not present

## 2022-08-30 NOTE — Progress Notes (Signed)
MEDICARE ANNUAL WELLNESS VISIT  08/30/2022  Telephone Visit Disclaimer This Medicare AWV was conducted by telephone due to national recommendations for restrictions regarding the COVID-19 Pandemic (e.g. social distancing).  I verified, using two identifiers, that I am speaking with Anna [name] or their authorized healthcare agent. I discussed the limitations, risks, security, and privacy concerns of performing an evaluation and management service by telephone and the potential availability of an in-person appointment in the future. The patient expressed understanding and agreed to proceed.  Location of Patient: Home Location of Provider (nurse):  In the office.  Subjective:    Anna [name] is a 86 y.o. female patient of Metheney, Rene Kocher, MD who had a Medicare Annual Wellness Visit today via telephone. Anna [name] is Retired and lives alone in a retirement community. she does not have any children. she reports that she is socially active and does interact with friends/family regularly. she is minimally physically active and enjoys watching old movies, playing piano and going shopping.  Patient Care Team: Hali Marry, MD as PCP - General (Family Medicine) Leonia Corona, MD as Referring Physician (Ophthalmology)     08/30/2022    9:32 AM 05/06/2021    2:04 PM 09/30/2019   11:00 AM 07/30/2019   11:03 AM 07/25/2018   11:06 AM 07/10/2018    2:15 PM 07/17/2017    1:34 PM  Advanced Directives  Does Patient Have a Medical Advance Directive? Yes Yes No Yes Yes Yes No  Type of Advance Directive Living will [place];Living will  [place];Living will Living will;Healthcare Power of Attorney Living will   Does patient want to make changes to medical advance directive? No - Patient declined   No - Patient declined Yes (MAU/Ambulatory/Procedural Areas - Information given)    Copy of [place] in Chart?  No - copy requested   Yes - validated most recent copy scanned in chart (See row information) Yes    Would patient like information on creating a medical advance directive?   No - Patient declined    No - Patient declined    Hospital Utilization Over the Past 12 Months: # of hospitalizations or ER visits: 1 # of surgeries: 0  Review of Systems    Patient reports that her overall health is unchanged compared to last year.  History obtained from chart review and the patient  Patient Reported Readings (BP, Pulse, CBG, Weight, etc) none  Pain Assessment Pain : No/denies pain     Current Medications & Allergies (verified) Allergies as of 08/30/2022       Reactions   Aleve [naproxen Sodium]    Causes elevation in cholesterol    Lisinopril Other (See Comments)   Cough   Azithromycin Rash        Medication List        Accurate as of August 30, 2022  9:51 AM. If you have any questions, ask your nurse or doctor.          ALPRAZolam 0.25 MG tablet Commonly known as: XANAX TAKE ONE TABLET (0.'25MG'$  TOTAL) BY MOUTH AT BEDTIME AS NEEDED FOR SLEEP   ammonium lactate 12 % cream Commonly known as: AMLACTIN Apply topically as needed for dry skin.   Bevacizumab 2.75 MG/0.11ML Sosy Inject into the eye.   calcium citrate 950 (200 Ca) MG tablet Commonly known as: CALCITRATE - dosed in mg elemental calcium Take by mouth.   calcium citrate-vitamin D 315-200 MG-UNIT tablet Commonly  known as: CITRACAL+D Take 1 tablet by mouth 2 (two) times daily.   celecoxib 200 MG capsule Commonly known as: CELEBREX TAKE 1-2 CAPSULES BY MOUTH EVERY DAY   cyanocobalamin 1000 MCG tablet Commonly known as: VITAMIN B12 Take 1,000 mcg by mouth daily.   escitalopram 5 MG tablet Commonly known as: LEXAPRO TAKE ONE TABLET BY MOUTH IN THE MORNING   Flaxseed Oil Oil 1,000 mg.   fluticasone-salmeterol 250-50 MCG/ACT Aepb Commonly known as: ADVAIR Inhale 1 puff into the lungs 2 (two) times daily.   ipratropium  0.03 % nasal spray Commonly known as: ATROVENT Place 2 sprays into both nostrils every 12 (twelve) hours.   latanoprost 0.005 % ophthalmic solution Commonly known as: XALATAN Place 1 drop into both eyes daily.   lovastatin 40 MG tablet Commonly known as: MEVACOR TAKE ONE TABLET BY MOUTH NIGHTLY/ labs for further refills   meclizine 25 MG tablet Commonly known as: ANTIVERT Take 1 tablet (25 mg total) by mouth 3 (three) times daily as needed for dizziness.   Myrbetriq 25 MG Tb24 tablet Generic drug: mirabegron ER Take 1 tablet (25 mg total) by mouth daily. (NEEDS APPOINTMENT BEFORE MORE REFILLS)   olmesartan 20 MG tablet Commonly known as: Benicar Take 1 tablet (20 mg total) by mouth daily.   omeprazole 40 MG capsule Commonly known as: PRILOSEC Take 1 capsule (40 mg total) by mouth daily.   PreserVision AREDS 2 Caps Take by mouth.   Prevagen 10 MG Caps Generic drug: Apoaequorin Take by mouth.   timolol 0.5 % ophthalmic solution Commonly known as: BETIMOL Place 1 drop into the left eye 2 (two) times daily.   triamcinolone cream 0.1 % Commonly known as: KENALOG Apply 1 application topically 2 (two) times daily as needed. Do not use for more than 2 weeks at a time   TURMERIC PO Take 1,000 mg by mouth.   Vitamin D-1000 Max St 25 MCG (1000 UT) tablet Generic drug: Cholecalciferol Take by mouth.        History (reviewed): Past Medical History:  Diagnosis Date   Dermatitis    seborrehic scalp   Hiatal hernia    History of basal cell carcinoma 07/20/2017   On nose.    History of melanoma 07/20/2017   Right upper arm    Hyperlipidemia    Macular degeneration    Past Surgical History:  Procedure Laterality Date   BREAST CYST EXCISION Right 09/1962   CATARACT EXTRACTION  03/2015   melonoma Right 06/2016   arm   MOHS SURGERY Left 06/2016   nose   Family History  Problem Relation Age of Onset   Cancer Mother        pancreatic   Cancer Father         liver   Hyperlipidemia Sister    Macular degeneration Sister    Cancer - Prostate Brother    Atrial fibrillation Maternal Aunt    Social History   Socioeconomic History   Marital status: Single    Spouse name: Not on file   Number of children: Not on file   Years of education: 12+   Highest education level: Master's degree (e.g., MA, MS, MEng, MEd, MSW, MBA)  Occupational History   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  Tobacco Use   Smoking status: Never   Smokeless tobacco: Never  Vaping Use   Vaping Use: Never used  Substance and Sexual Activity  Alcohol use: Not Currently    Alcohol/week: 1.0 standard drink of alcohol    Types: 1 Glasses of wine per week    Comment: occasionally   Drug use: No   Sexual activity: Never  Other Topics Concern   Not on file  Social History Narrative   She has a masters degree in Film/video editor.  He lives alone in a retirement community.  Her sister is Pharmacist, community.    Social Determinants of Health   Financial Resource Strain: Low Risk  (08/30/2022)   Overall Financial Resource Strain (CARDIA)    Difficulty of Paying Living Expenses: Not very hard  Food Insecurity: No Food Insecurity (08/30/2022)   Hunger Vital Sign    Worried About Running Out of Food in the Last Year: Never true    Ran Out of Food in the Last Year: Never true  Transportation Needs: No Transportation Needs (08/30/2022)   PRAPARE - Hydrologist (Medical): No    Lack of Transportation (Non-Medical): No  Physical Activity: Inactive (08/30/2022)   Exercise Vital Sign    Days of Exercise per Week: 0 days    Minutes of Exercise per Session: 0 min  Stress: No Stress Concern Present (08/30/2022)   [place]    Feeling of Stress : Not at all  Social Connections: Moderately Isolated (08/30/2022)   Social Connection and  Isolation Panel [NHANES]    Frequency of Communication with Friends and Family: More than three times a week    Frequency of Social Gatherings with Friends and Family: More than three times a week    Attends Religious Services: More than 4 times per year    Active Member of [employer] or Organizations: No    Attends Archivist Meetings: Never    Marital Status: Never married    Activities of Daily Living    08/30/2022    9:38 AM  In your present state of health, do you have any difficulty performing the following activities:  Hearing? 0  Vision? 0  Difficulty concentrating or making decisions? 1  Comment some memory loss  Walking or climbing stairs? 0  Dressing or bathing? 0  Doing errands, shopping? 0  Preparing Food and eating ? N  Using the Toilet? N  In the past six months, have you accidently leaked urine? N  Do you have problems with loss of bowel control? N  Managing your Medications? N  Managing your Finances? N  Housekeeping or managing your Housekeeping? N    Patient Education/ Literacy How often do you need to have someone help you when you read instructions, pamphlets, or other written materials from your doctor or pharmacy?: 1 - Never What is the last grade level you completed in school?: Masters degree  Exercise Current Exercise Habits: The patient does not participate in regular exercise at present, Exercise limited by: None identified  Diet Patient reports consuming 3 meals a day and 1 snack(s) a day Patient reports that her primary diet is: Regular Patient reports that she does have regular access to food.   Depression Screen    08/30/2022    9:33 AM 02/07/2022   10:53 AM 07/30/2019   11:04 AM 07/25/2018   11:13 AM 07/10/2018    2:16 PM 07/17/2017    1:33 PM  PHQ 2/9 Scores  PHQ - 2 Score 0 4 0 0 0 0  PHQ- 9 Score  7  Fall Risk    08/30/2022    9:32 AM 03/22/2022    1:14 PM 02/07/2022   10:54 AM 07/30/2019   11:04 AM 07/25/2018    11:13 AM  Fall Risk   Falls in the past year? 0 0 0 0 No  Number falls in past yr: 0 0 0 0   Injury with Fall? 0 0 0 0   Risk for fall due to : No Fall Risks No Fall Risks No Fall Risks History of fall(s) Impaired mobility  Follow up Falls evaluation completed Falls prevention discussed;Falls evaluation completed Falls prevention discussed Falls prevention discussed      Objective:  Anna [name] seemed alert and oriented and she participated appropriately during our telephone visit.  Blood Pressure Weight BMI  BP Readings from Last 3 Encounters:  08/17/22 132/76  07/25/22 (!) 149/88  07/18/22 137/78   Wt Readings from Last 3 Encounters:  08/17/22 179 lb (81.2 kg)  07/11/22 179 lb 4.8 oz (81.3 kg)  07/04/22 179 lb (81.2 kg)   BMI Readings from Last 1 Encounters:  08/17/22 32.74 kg/m    *Unable to obtain current vital signs, weight, and BMI due to telephone visit type  Hearing/Vision  Siona did not seem to have difficulty with hearing/understanding during the telephone conversation Reports that she has had a formal eye exam by an eye care professional within the past year Reports that she has not had a formal hearing evaluation within the past year *Unable to fully assess hearing and vision during telephone visit type  Cognitive Function:    08/30/2022    9:42 AM 07/30/2019   11:07 AM 07/25/2018   11:18 AM  6CIT Screen  What Year? 0 points 0 points 0 points  What month? 0 points 0 points 0 points  What time? 0 points 0 points 0 points  Count back from 20 0 points 0 points 0 points  Months in reverse 0 points 0 points 0 points  Repeat phrase 0 points 0 points 2 points  Total Score 0 points 0 points 2 points   (Normal:0-7, Significant for Dysfunction: >8)  Normal Cognitive Function Screening: Yes   Immunization & Health Maintenance Record Immunization History  Administered Date(s) Administered   COVID-19, mRNA, vaccine(Comirnaty)12 years and older 07/25/2022    Fluad Quad(high Dose 65+) 06/22/2020, 07/25/2022   Influenza, High Dose Seasonal PF 08/03/2015, 06/30/2017, 07/10/2018, 05/30/2019   Janssen (J&J) SARS-COV-2 Vaccination 12/19/2019, 07/31/2020   Moderna SARS-COV2 Booster Vaccination 04/23/2021   Tdap 09/26/2010, 09/26/2013, 06/16/2022    Health Maintenance  Topic Date Due   Zoster Vaccines- Shingrix (1 of 2) 11/29/2022 (Originally 12/01/1954)   Pneumonia Vaccine 33+ Years old (1 - PCV) 03/23/2023 (Originally 12/02/1941)   COVID-19 Vaccine (4 - 2023-24 season) 09/19/2022   Medicare Annual Wellness (AWV)  08/31/2023   DTaP/Tdap/Td (4 - Td or Tdap) 06/16/2032   INFLUENZA VACCINE  Completed   DEXA SCAN  Completed   HPV VACCINES  Aged Out       Assessment  This is a routine wellness examination for [employer]  Health Maintenance: Due or Overdue There are no preventive care reminders to display for this patient.   Anna [name] does not need a referral for [employer] Assistance: Care Management:   no Social Work:    no Prescription Assistance:  no Nutrition/Diabetes Education:  no   Plan:  Personalized Goals  Goals Addressed               This Visit's  Progress     Patient Stated (pt-stated)        08/30/2022 AWV Goal: Exercise for General Health  Patient will verbalize understanding of the benefits of increased physical activity: Exercising regularly is important. It will improve your overall fitness, flexibility, and endurance. Regular exercise also will improve your overall health. It can help you control your weight, reduce stress, and improve your bone density. Over the next year, patient will increase physical activity as tolerated with a goal of at least 150 minutes of moderate physical activity per week.  You can tell that you are exercising at a moderate intensity if your heart starts beating faster and you start breathing faster but can still hold a conversation. Moderate-intensity exercise ideas  include: Walking 1 mile (1.6 km) in about 15 minutes Biking Hiking Golfing Dancing Water aerobics Patient will verbalize understanding of everyday activities that increase physical activity by providing examples like the following: Yard work, such as: Sales promotion account executive Gardening Washing windows or floors Patient will be able to explain general safety guidelines for exercising:  Before you start a new exercise program, talk with your health care provider. Do not exercise so much that you hurt yourself, feel dizzy, or get very short of breath. Wear comfortable clothes and wear shoes with good support. Drink plenty of water while you exercise to prevent dehydration or heat stroke. Work out until your breathing and your heartbeat get faster.        Personalized Health Maintenance & Screening Recommendations  Pneumococcal vaccine  Shingles vaccine Bone density scan  Lung Cancer Screening Recommended: no (Low Dose CT Chest recommended if Age 53-80 years, 30 pack-year currently smoking OR have quit w/in past 15 years) Hepatitis C Screening recommended: no HIV Screening recommended: no  Advanced Directives: Written information was not prepared per patient's request.  Referrals & Orders Orders Placed This Encounter  Procedures   [place]    Follow-up Plan Follow-up with Hali Marry, MD as planned Schedule shingles vaccine at the pharmacy.  Pneumonia vaccine can be done in the office in January.  Medicare wellness visit in one year. AVS printed and mailed to the patient.   I have personally reviewed and noted the following in the patient's chart:   Medical and social history Use of alcohol, tobacco or illicit drugs  Current medications and supplements Functional ability and status Nutritional status Physical activity Advanced directives List of other  physicians Hospitalizations, surgeries, and ER visits in previous 12 months Vitals Screenings to include cognitive, depression, and falls Referrals and appointments  In addition, I have reviewed and discussed with Anna [name] certain preventive protocols, quality metrics, and best practice recommendations. A written personalized care plan for preventive services as well as general preventive health recommendations is available and can be mailed to the patient at her request.      Tinnie Gens, RN BSN  08/30/2022

## 2022-08-30 NOTE — Patient Instructions (Signed)
[place] Maintenance Summary and Written Plan of Care  Anna [name] ,  Thank you for allowing me to perform your Medicare Annual Wellness Visit and for your ongoing commitment to your health.   Health Maintenance & Immunization History Health Maintenance  Topic Date Due   Zoster Vaccines- Shingrix (1 of 2) 11/29/2022 (Originally 12/01/1954)   Pneumonia Vaccine 64+ Years old (1 - PCV) 03/23/2023 (Originally 12/02/1941)   COVID-19 Vaccine (4 - 2023-24 season) 09/19/2022   Medicare Annual Wellness (AWV)  08/31/2023   DTaP/Tdap/Td (4 - Td or Tdap) 06/16/2032   INFLUENZA VACCINE  Completed   DEXA SCAN  Completed   HPV VACCINES  Aged Out   Immunization History  Administered Date(s) Administered   COVID-19, mRNA, vaccine(Comirnaty)12 years and older 07/25/2022   Fluad Quad(high Dose 65+) 06/22/2020, 07/25/2022   Influenza, High Dose Seasonal PF 08/03/2015, 06/30/2017, 07/10/2018, 05/30/2019   Janssen (J&J) SARS-COV-2 Vaccination 12/19/2019, 07/31/2020   Moderna SARS-COV2 Booster Vaccination 04/23/2021   Tdap 09/26/2010, 09/26/2013, 06/16/2022    These are the patient goals that we discussed:  Goals Addressed               This Visit's Progress     Patient Stated (pt-stated)        08/30/2022 AWV Goal: Exercise for General Health  Patient will verbalize understanding of the benefits of increased physical activity: Exercising regularly is important. It will improve your overall fitness, flexibility, and endurance. Regular exercise also will improve your overall health. It can help you control your weight, reduce stress, and improve your bone density. Over the next year, patient will increase physical activity as tolerated with a goal of at least 150 minutes of moderate physical activity per week.  You can tell that you are exercising at a moderate intensity if your heart starts beating faster and you start breathing faster but can still hold a  conversation. Moderate-intensity exercise ideas include: Walking 1 mile (1.6 km) in about 15 minutes Biking Hiking Golfing Dancing Water aerobics Patient will verbalize understanding of everyday activities that increase physical activity by providing examples like the following: Yard work, such as: Sales promotion account executive Gardening Washing windows or floors Patient will be able to explain general safety guidelines for exercising:  Before you start a new exercise program, talk with your health care provider. Do not exercise so much that you hurt yourself, feel dizzy, or get very short of breath. Wear comfortable clothes and wear shoes with good support. Drink plenty of water while you exercise to prevent dehydration or heat stroke. Work out until your breathing and your heartbeat get faster.          This is a list of Health Maintenance Items that are overdue or due now: Pneumococcal vaccine  Shingles vaccine Bone density scan    Orders/Referrals Placed Today: Orders Placed This Encounter  Procedures   DEXAScan    Standing Status:   Future    Standing Expiration Date:   08/31/2023    Scheduling Instructions:     Please call patient to schedule.    Order Specific Question:   Reason for exam:    Answer:   post menopausal    Order Specific Question:   Preferred imaging location?    Answer:   [facility]   (Contact our referral department at [phone] if you have not spoken with someone about your referral appointment within  the next 5 days)    Follow-up Plan Follow-up with Hali Marry, MD as planned Schedule shingles vaccine at the pharmacy.  Pneumonia vaccine can be done in the office in January.  Medicare wellness visit in one year. AVS printed and mailed to the patient.      Health Maintenance, Female Adopting a healthy lifestyle and getting preventive care are  important in promoting health and wellness. Ask your health care provider about: The right schedule for you to have regular tests and exams. Things you can do on your own to prevent diseases and keep yourself healthy. What should I know about diet, weight, and exercise? Eat a healthy diet  Eat a diet that includes plenty of vegetables, fruits, low-fat dairy products, and lean protein. Do not eat a lot of foods that are high in solid fats, added sugars, or sodium. Maintain a healthy weight Body mass index (BMI) is used to identify weight problems. It estimates body fat based on height and weight. Your health care provider can help determine your BMI and help you achieve or maintain a healthy weight. Get regular exercise Get regular exercise. This is one of the most important things you can do for your health. Most adults should: Exercise for at least 150 minutes each week. The exercise should increase your heart rate and make you sweat (moderate-intensity exercise). Do strengthening exercises at least twice a week. This is in addition to the moderate-intensity exercise. Spend less time sitting. Even light physical activity can be beneficial. Watch cholesterol and blood lipids Have your blood tested for lipids and cholesterol at 84 years of age, then have this test every 5 years. Have your cholesterol levels checked more often if: Your lipid or cholesterol levels are high. You are older than 84 years of age. You are at high risk for heart disease. What should I know about cancer screening? Depending on your health history and family history, you may need to have cancer screening at various ages. This may include screening for: Breast cancer. Cervical cancer. Colorectal cancer. Skin cancer. Lung cancer. What should I know about heart disease, diabetes, and high blood pressure? Blood pressure and heart disease High blood pressure causes heart disease and increases the risk of stroke. This  is more likely to develop in people who have high blood pressure readings or are overweight. Have your blood pressure checked: Every 3-5 years if you are 24-38 years of age. Every year if you are 82 years old or older. Diabetes Have regular diabetes screenings. This checks your fasting blood sugar level. Have the screening done: Once every three years after age 30 if you are at a normal weight and have a low risk for diabetes. More often and at a younger age if you are overweight or have a high risk for diabetes. What should I know about preventing infection? Hepatitis B If you have a higher risk for hepatitis B, you should be screened for this virus. Talk with your health care provider to find out if you are at risk for hepatitis B infection. Hepatitis C Testing is recommended for: Everyone born from [birth year] through 1963. Anyone with known risk factors for hepatitis C. Sexually transmitted infections (STIs) Get screened for STIs, including gonorrhea and chlamydia, if: You are sexually active and are younger than 84 years of age. You are older than 84 years of age and your health care provider tells you that you are at risk for this type of infection. Your sexual activity  has changed since you were last screened, and you are at increased risk for chlamydia or gonorrhea. Ask your health care provider if you are at risk. Ask your health care provider about whether you are at high risk for HIV. Your health care provider may recommend a prescription medicine to help prevent HIV infection. If you choose to take medicine to prevent HIV, you should first get tested for HIV. You should then be tested every 3 months for as long as you are taking the medicine. Pregnancy If you are about to stop having your period (premenopausal) and you may become pregnant, seek counseling before you get pregnant. Take 400 to 800 micrograms (mcg) of folic acid every day if you become pregnant. Ask for birth control  (contraception) if you want to prevent pregnancy. Osteoporosis and menopause Osteoporosis is a disease in which the bones lose minerals and strength with aging. This can result in bone fractures. If you are 68 years old or older, or if you are at risk for osteoporosis and fractures, ask your health care provider if you should: Be screened for bone loss. Take a calcium or vitamin D supplement to lower your risk of fractures. Be given hormone replacement therapy (HRT) to treat symptoms of menopause. Follow these instructions at home: Alcohol use Do not drink alcohol if: Your health care provider tells you not to drink. You are pregnant, may be pregnant, or are planning to become pregnant. If you drink alcohol: Limit how much you have to: 0-1 drink a day. Know how much alcohol is in your drink. In the [place] one drink equals one 12 oz bottle of beer (355 mL), one 5 oz glass of wine (148 mL), or one 1 oz glass of hard liquor (44 mL). Lifestyle Do not use any products that contain nicotine or tobacco. These products include cigarettes, chewing tobacco, and vaping devices, such as e-cigarettes. If you need help quitting, ask your health care provider. Do not use street drugs. Do not share needles. Ask your health care provider for help if you need support or information about quitting drugs. General instructions Schedule regular health, dental, and eye exams. Stay current with your vaccines. Tell your health care provider if: You often feel depressed. You have ever been abused or do not feel safe at home. Summary Adopting a healthy lifestyle and getting preventive care are important in promoting health and wellness. Follow your health care provider's instructions about healthy diet, exercising, and getting tested or screened for diseases. Follow your health care provider's instructions on monitoring your cholesterol and blood pressure. This information is not intended to replace advice given  to you by your health care provider. Make sure you discuss any questions you have with your health care provider. Document Revised: 02/01/2021 Document Reviewed: 02/01/2021 Elsevier Patient Education  [place].

## 2022-09-05 ENCOUNTER — Encounter: Payer: Self-pay | Admitting: Sports Medicine

## 2022-09-05 ENCOUNTER — Ambulatory Visit (INDEPENDENT_AMBULATORY_CARE_PROVIDER_SITE_OTHER): Payer: Medicare Other | Admitting: Sports Medicine

## 2022-09-05 VITALS — BP 137/86 | HR 91 | Wt 179.0 lb

## 2022-09-05 DIAGNOSIS — S81811A Laceration without foreign body, right lower leg, initial encounter: Secondary | ICD-10-CM

## 2022-09-05 NOTE — Progress Notes (Signed)
    Procedures performed today:    None.  Independent interpretation of notes and tests performed by another provider:   None.  Brief History, Exam, Impression, and Recommendations:    Noninfected skin tear of leg, right, initial encounter Doing really well, skin tear is completely healed, return as needed.    ____________________________________________ Gwen Her. Dianah Field, M.D., ABFM., CAQSM., AME. Primary Care and Sports Medicine [facility]  Adjunct Professor of [employer] [facility] [employer]  Risk manager

## 2022-09-05 NOTE — Assessment & Plan Note (Signed)
Doing really well, skin tear is completely healed, return as needed.

## 2022-09-06 DIAGNOSIS — H353221 Exudative age-related macular degeneration, left eye, with active choroidal neovascularization: Secondary | ICD-10-CM | POA: Diagnosis not present

## 2022-09-08 ENCOUNTER — Other Ambulatory Visit: Payer: Self-pay | Admitting: Family Medicine

## 2022-09-13 DIAGNOSIS — H353211 Exudative age-related macular degeneration, right eye, with active choroidal neovascularization: Secondary | ICD-10-CM | POA: Diagnosis not present

## 2022-09-14 DIAGNOSIS — Z85820 Personal history of malignant melanoma of skin: Secondary | ICD-10-CM | POA: Diagnosis not present

## 2022-09-14 DIAGNOSIS — D225 Melanocytic nevi of trunk: Secondary | ICD-10-CM | POA: Diagnosis not present

## 2022-09-14 DIAGNOSIS — L814 Other melanin hyperpigmentation: Secondary | ICD-10-CM | POA: Diagnosis not present

## 2022-09-14 DIAGNOSIS — L209 Atopic dermatitis, unspecified: Secondary | ICD-10-CM | POA: Diagnosis not present

## 2022-09-23 ENCOUNTER — Ambulatory Visit (INDEPENDENT_AMBULATORY_CARE_PROVIDER_SITE_OTHER): Payer: Medicare Other

## 2022-09-23 ENCOUNTER — Ambulatory Visit (INDEPENDENT_AMBULATORY_CARE_PROVIDER_SITE_OTHER): Payer: Medicare Other | Admitting: Sports Medicine

## 2022-09-23 DIAGNOSIS — M1711 Unilateral primary osteoarthritis, right knee: Secondary | ICD-10-CM

## 2022-09-23 DIAGNOSIS — M170 Bilateral primary osteoarthritis of knee: Secondary | ICD-10-CM | POA: Diagnosis not present

## 2022-09-23 DIAGNOSIS — M4313 Spondylolisthesis, cervicothoracic region: Secondary | ICD-10-CM | POA: Diagnosis not present

## 2022-09-23 DIAGNOSIS — M2578 Osteophyte, vertebrae: Secondary | ICD-10-CM | POA: Diagnosis not present

## 2022-09-23 DIAGNOSIS — M5030 Other cervical disc degeneration, unspecified cervical region: Secondary | ICD-10-CM

## 2022-09-23 DIAGNOSIS — M47812 Spondylosis without myelopathy or radiculopathy, cervical region: Secondary | ICD-10-CM | POA: Diagnosis not present

## 2022-09-23 MED ORDER — TRIAMCINOLONE ACETONIDE 40 MG/ML IJ SUSP
40.0000 mg | Freq: Once | INTRAMUSCULAR | Status: AC
  Administered 2022-09-23: 40 mg via INTRAMUSCULAR

## 2022-09-23 NOTE — Assessment & Plan Note (Addendum)
Increasing pain, achiness medial upper arm, medial lower arm concerning for a C8 versus T1 radiculitis, previous x-rays done a couple of months ago showed widespread degenerative changes, adding some updated x-rays with flexion/extension views, home conditioning, if not better in about 6 weeks we will proceed with MRI of her cervical spine.

## 2022-09-23 NOTE — Assessment & Plan Note (Signed)
Pleasant 86 year old female, known knee osteoarthritis, her last right knee injection was in July of this year, she is not having pain in her left knee, medial joint line pain, only minimal swelling, injected today, return to see me as needed.

## 2022-09-23 NOTE — Progress Notes (Signed)
    Procedures performed today:    Procedure: Real-time Ultrasound Guided injection of the left knee Device: Samsung HS60  Verbal informed consent obtained.  Time-out conducted.  Noted no overlying erythema, induration, or other signs of local infection.  Skin prepped in a sterile fashion.  Local anesthesia: Topical Ethyl chloride.  With sterile technique and under real time ultrasound guidance: Mild effusion noted, 1 cc Kenalog 40, 2 cc lidocaine, 2 cc bupivacaine injected easily Completed without difficulty  Advised to call if fevers/chills, erythema, induration, drainage, or persistent bleeding.  Images permanently stored and available for review in PACS.  Impression: Technically successful ultrasound guided injection.  Independent interpretation of notes and tests performed by another provider:   None.  Brief History, Exam, Impression, and Recommendations:    Primary osteoarthritis of both knees Pleasant 86 year old female, known knee osteoarthritis, her last right knee injection was in July of this year, she is not having pain in her left knee, medial joint line pain, only minimal swelling, injected today, return to see me as needed.  DDD (degenerative disc disease), cervical Increasing pain, achiness medial upper arm, medial lower arm concerning for a C8 versus T1 radiculitis, previous x-rays done a couple of months ago showed widespread degenerative changes, adding some updated x-rays with flexion/extension views, home conditioning, if not better in about 6 weeks we will proceed with MRI of her cervical spine.    ____________________________________________ Gwen Her. Dianah Field, M.D., ABFM., CAQSM., AME. Primary Care and Sports Medicine [facility]  Adjunct Professor of [place] [employer] [facility] of Medicine  Risk manager

## 2022-09-28 ENCOUNTER — Ambulatory Visit
Admission: RE | Admit: 2022-09-28 | Discharge: 2022-09-28 | Disposition: A | Discharge status: Home / Self Care | Payer: Medicare Other | Source: Ambulatory Visit | Attending: Family Medicine | Admitting: Family Medicine

## 2022-09-28 DIAGNOSIS — Z1231 Encounter for screening mammogram for malignant neoplasm of breast: Secondary | ICD-10-CM

## 2022-09-29 NOTE — Progress Notes (Signed)
Please call patient. Normal mammogram.  Repeat in 1 year.  

## 2022-10-01 ENCOUNTER — Other Ambulatory Visit: Payer: Self-pay | Admitting: Family Medicine

## 2022-10-18 ENCOUNTER — Ambulatory Visit: Payer: Medicare Other | Admitting: Family Medicine

## 2022-10-18 DIAGNOSIS — H353221 Exudative age-related macular degeneration, left eye, with active choroidal neovascularization: Secondary | ICD-10-CM | POA: Diagnosis not present

## 2022-10-25 ENCOUNTER — Encounter: Payer: Self-pay | Admitting: Family Medicine

## 2022-10-25 ENCOUNTER — Ambulatory Visit (INDEPENDENT_AMBULATORY_CARE_PROVIDER_SITE_OTHER): Payer: Medicare Other | Admitting: Family Medicine

## 2022-10-25 VITALS — BP 140/88 | HR 79 | Wt 178.0 lb

## 2022-10-25 DIAGNOSIS — I700 Atherosclerosis of aorta: Secondary | ICD-10-CM

## 2022-10-25 DIAGNOSIS — R7301 Impaired fasting glucose: Secondary | ICD-10-CM | POA: Diagnosis not present

## 2022-10-25 DIAGNOSIS — N898 Other specified noninflammatory disorders of vagina: Secondary | ICD-10-CM | POA: Diagnosis not present

## 2022-10-25 DIAGNOSIS — R300 Dysuria: Secondary | ICD-10-CM | POA: Diagnosis not present

## 2022-10-25 DIAGNOSIS — I10 Essential (primary) hypertension: Secondary | ICD-10-CM | POA: Diagnosis not present

## 2022-10-25 LAB — POCT GLYCOSYLATED HEMOGLOBIN (HGB A1C): Hemoglobin A1C: 5.9 % — AB (ref 4.0–5.6)

## 2022-10-25 LAB — POCT URINALYSIS DIP (CLINITEK)
Bilirubin, UA: NEGATIVE
Blood, UA: NEGATIVE
Glucose, UA: 100 mg/dL — AB
Ketones, POC UA: NEGATIVE mg/dL
Nitrite, UA: NEGATIVE
POC PROTEIN,UA: NEGATIVE
Spec Grav, UA: >=1.03 — AB (ref 1.010–1.025)
Urobilinogen, UA: 1 E.U./dL
pH, UA: 6 (ref 5.0–8.0)

## 2022-10-25 LAB — WET PREP FOR TRICH, YEAST, CLUE
MICRO NUMBER:: 14492754
Specimen Quality: ADEQUATE

## 2022-10-25 MED ORDER — TRIAMCINOLONE ACETONIDE 0.1 % EX CREA: 1.0000 | TOPICAL_CREAM | Freq: Two times a day (BID) | CUTANEOUS | 0 refills | Status: DC | PRN

## 2022-10-25 NOTE — Assessment & Plan Note (Signed)
A1c looks good at 5.9.  Continue to work on healthy diet and regular exercise.  Lab Results  Component Value Date   HGBA1C 5.9 (A) 10/25/2022

## 2022-10-25 NOTE — Assessment & Plan Note (Signed)
BP a little elevated today, not her usuall. Will check again at her followupdue for CMP.

## 2022-10-25 NOTE — Progress Notes (Signed)
Alexius, the swab was negative for any yeast or bacteria which is very reassuring.  Still awaiting the urine culture.  Recommend using a riser in the groin area.Coconut oil works well and is good for the skin.  Still awaiting urine culture

## 2022-10-25 NOTE — Progress Notes (Signed)
Established Patient Office Visit  Subjective   Patient ID: Anna [name], female    DOB: 03-21-1936  Age: 87 y.o. MRN: [id number]  Chief Complaint  Patient presents with   Hypertension   ifg    HPI   Hypertension- Pt denies chest pain, SOB, dizziness, or heart palpitations.  Taking meds as directed w/o problems.  Denies medication side effects.    Impaired fasting glucose-no increased thirst or urination. No symptoms consistent with hypoglycemia.  Atherosclerosis -Already  on a statin daily and tolerating well.  Also reports that since last week she has had some groin itching and a burning sensation.  In the last couple days it has been more uncomfortable after she urinates.  She has not noticed any blood in the urine but noticed a little pink tinge when she wiped her last week.  No rash that she can tell.  No abnormal discharge or abnormal odor to the urine.  She says it just feels raw and irritated.     ROS    Objective:     BP (!) 140/88   Pulse 79   Wt 178 lb (80.7 kg)   SpO2 98%   BMI 32.56 kg/m    Physical Exam Vitals and nursing note reviewed. Exam conducted with a chaperone present.  Constitutional:      Appearance: She is well-developed.  HENT:     Head: Normocephalic and atraumatic.  Cardiovascular:     Rate and Rhythm: Normal rate and regular rhythm.     Heart sounds: Normal heart sounds.  Pulmonary:     Effort: Pulmonary effort is normal.     Breath sounds: Normal breath sounds.  Genitourinary:    Pubic Area: No rash.      Labia:        Right: No rash.        Left: No rash.      Comments: Slight irritation at the vaginal opening.  Membranes are dry.  No abnormal discharge.  No discrete rash. Skin:    General: Skin is warm and dry.  Neurological:     Mental Status: She is alert and oriented to person, place, and time.  Psychiatric:        Behavior: Behavior normal.      Results for orders placed or performed in visit on 10/25/22  POCT  glycosylated hemoglobin (Hb A1C)  Result Value Ref Range   Hemoglobin A1C 5.9 (A) 4.0 - 5.6 %   HbA1c POC (<> result, manual entry)     HbA1c, POC (prediabetic range)     HbA1c, POC (controlled diabetic range)    POCT URINALYSIS DIP (CLINITEK)  Result Value Ref Range   Color, UA yellow yellow   Clarity, UA clear clear   Glucose, UA =100 (A) negative mg/dL   Bilirubin, UA negative negative   Ketones, POC UA negative negative mg/dL   Spec Grav, UA >=1.030 (A) 1.010 - 1.025   Blood, UA negative negative   pH, UA 6.0 5.0 - 8.0   POC PROTEIN,UA negative negative, trace   Urobilinogen, UA 1.0 0.2 or 1.0 E.U./dL   Nitrite, UA Negative Negative   Leukocytes, UA Small (1+) (A) Negative      The ASCVD Risk score (Arnett DK, et al., 2019) failed to calculate for the following reasons:   The 2019 ASCVD risk score is only valid for ages 63 to 64    Assessment & Plan:   Problem List Items Addressed This Visit  Cardiovascular and Mediastinum   Primary hypertension - Primary    BP a little elevated today, not her usuall. Will check again at her followupdue for CMP.        Relevant Orders   BASIC METABOLIC PANEL WITH GFR   Aortic atherosclerosis (HCC)     Endocrine   IFG (impaired fasting glucose)    A1c looks good at 5.9.  Continue to work on healthy diet and regular exercise.  Lab Results  Component Value Date   HGBA1C 5.9 (A) 10/25/2022        Relevant Orders   POCT glycosylated hemoglobin (Hb A1C) (Completed)   Other Visit Diagnoses     Vaginal itching       Relevant Medications   triamcinolone cream (KENALOG) 0.1 %   Other Relevant Orders   WET PREP FOR TRICH, YEAST, CLUE   POCT URINALYSIS DIP (CLINITEK) (Completed)   Dysuria       Relevant Orders   Urine Culture       Vaginal itching-wet prep performed will call with results.  She was also having irritation after urinating so we did a urinalysis as well positive for leukocytes.  Will send for culture  for confirmation.  No follow-ups on file.    Beatrice Lecher, MD

## 2022-10-26 DIAGNOSIS — H353211 Exudative age-related macular degeneration, right eye, with active choroidal neovascularization: Secondary | ICD-10-CM | POA: Diagnosis not present

## 2022-10-26 LAB — BASIC METABOLIC PANEL WITH GFR
BUN: 18 mg/dL (ref 7–25)
CO2: 29 mmol/L (ref 20–32)
Calcium: 9.1 mg/dL (ref 8.6–10.4)
Chloride: 104 mmol/L (ref 98–110)
Creat: 0.89 mg/dL (ref 0.60–0.95)
Glucose, Bld: 83 mg/dL (ref 65–99)
Potassium: 4 mmol/L (ref 3.5–5.3)
Sodium: 142 mmol/L (ref 135–146)
eGFR: 64 mL/min/{1.73_m2} (ref >=60)

## 2022-10-26 LAB — URINE CULTURE
MICRO NUMBER:: 14493102
SPECIMEN QUALITY:: ADEQUATE

## 2022-10-26 NOTE — Progress Notes (Signed)
Your lab work is within acceptable range and there are no concerning findings.   ?

## 2022-10-27 NOTE — Progress Notes (Signed)
Patient: Urine culture is negative.  Please see if she is feeling any better.

## 2022-11-04 ENCOUNTER — Ambulatory Visit (INDEPENDENT_AMBULATORY_CARE_PROVIDER_SITE_OTHER): Payer: Medicare Other

## 2022-11-04 ENCOUNTER — Ambulatory Visit (INDEPENDENT_AMBULATORY_CARE_PROVIDER_SITE_OTHER): Payer: Medicare Other | Admitting: Sports Medicine

## 2022-11-04 DIAGNOSIS — M5030 Other cervical disc degeneration, unspecified cervical region: Secondary | ICD-10-CM

## 2022-11-04 DIAGNOSIS — M170 Bilateral primary osteoarthritis of knee: Secondary | ICD-10-CM

## 2022-11-04 DIAGNOSIS — M545 Low back pain, unspecified: Secondary | ICD-10-CM

## 2022-11-04 DIAGNOSIS — G8929 Other chronic pain: Secondary | ICD-10-CM

## 2022-11-04 NOTE — Assessment & Plan Note (Signed)
Improving considerably with conservative treatment and home rehab, we will just watch this for now.

## 2022-11-04 NOTE — Assessment & Plan Note (Signed)
Restarting home conditioning, return as needed for this.

## 2022-11-04 NOTE — Assessment & Plan Note (Addendum)
Pain improved a lot in the left knee after her injection at the last visit but she does get some buckling, no overt mechanical locking. This typically occurs when getting up after sitting for a period of time. Her right knee is starting to hurt, we did a right knee injection today. We will add some home conditioning, reaction knee brace, I did advise her to kick her legs and take into the range of motion for maybe a minute before getting up after sitting for period of time. Strengthening will help, return as needed.

## 2022-11-04 NOTE — Progress Notes (Signed)
    Procedures performed today:    Procedure: Real-time Ultrasound Guided injection of the right knee Device: Samsung HS60  Verbal informed consent obtained.  Time-out conducted.  Noted no overlying erythema, induration, or other signs of local infection.  Skin prepped in a sterile fashion.  Local anesthesia: Topical Ethyl chloride.  With sterile technique and under real time ultrasound guidance: Trace effusion noted, 1 cc Kenalog 40, 2 cc lidocaine, 2 cc bupivacaine injected easily Completed without difficulty  Advised to call if fevers/chills, erythema, induration, drainage, or persistent bleeding.  Images permanently stored and available for review in PACS.  Impression: Technically successful ultrasound guided injection.  Independent interpretation of notes and tests performed by another provider:   None.  Brief History, Exam, Impression, and Recommendations:    DDD (degenerative disc disease), cervical Improving considerably with conservative treatment and home rehab, we will just watch this for now.  Primary osteoarthritis of both knees Pain improved a lot in the left knee after her injection at the last visit but she does get some buckling, no overt mechanical locking. This typically occurs when getting up after sitting for a period of time. Her right knee is starting to hurt, we did a right knee injection today. We will add some home conditioning, reaction knee brace, I did advise her to kick her legs and take into the range of motion for maybe a minute before getting up after sitting for period of time. Strengthening will help, return as needed.  Chronic bilateral low back pain without sciatica Restarting home conditioning, return as needed for this.    ____________________________________________ Gwen Her. Dianah Field, M.D., ABFM., CAQSM., AME. Primary Care and Sports Medicine [facility]  Adjunct Professor of [place] of  [facility] of Medicine  Risk manager

## 2022-11-11 DIAGNOSIS — H526 Other disorders of refraction: Secondary | ICD-10-CM | POA: Diagnosis not present

## 2022-11-11 DIAGNOSIS — H02403 Unspecified ptosis of bilateral eyelids: Secondary | ICD-10-CM | POA: Diagnosis not present

## 2022-11-11 DIAGNOSIS — D3132 Benign neoplasm of left choroid: Secondary | ICD-10-CM | POA: Diagnosis not present

## 2022-11-11 DIAGNOSIS — H401131 Primary open-angle glaucoma, bilateral, mild stage: Secondary | ICD-10-CM | POA: Diagnosis not present

## 2022-11-11 DIAGNOSIS — H353211 Exudative age-related macular degeneration, right eye, with active choroidal neovascularization: Secondary | ICD-10-CM | POA: Diagnosis not present

## 2022-11-11 DIAGNOSIS — H353122 Nonexudative age-related macular degeneration, left eye, intermediate dry stage: Secondary | ICD-10-CM | POA: Diagnosis not present

## 2022-11-29 DIAGNOSIS — H353221 Exudative age-related macular degeneration, left eye, with active choroidal neovascularization: Secondary | ICD-10-CM | POA: Diagnosis not present

## 2022-12-14 DIAGNOSIS — H353211 Exudative age-related macular degeneration, right eye, with active choroidal neovascularization: Secondary | ICD-10-CM | POA: Diagnosis not present

## 2023-01-04 ENCOUNTER — Ambulatory Visit (INDEPENDENT_AMBULATORY_CARE_PROVIDER_SITE_OTHER): Payer: Medicare Other | Admitting: Family Medicine

## 2023-01-04 ENCOUNTER — Encounter: Payer: Self-pay | Admitting: Family Medicine

## 2023-01-04 VITALS — BP 130/83 | HR 82 | Ht 62.0 in | Wt 178.0 lb

## 2023-01-04 DIAGNOSIS — R42 Dizziness and giddiness: Secondary | ICD-10-CM | POA: Diagnosis not present

## 2023-01-04 DIAGNOSIS — S00522A Blister (nonthermal) of oral cavity, initial encounter: Secondary | ICD-10-CM

## 2023-01-04 DIAGNOSIS — R413 Other amnesia: Secondary | ICD-10-CM

## 2023-01-04 LAB — CBC WITH DIFFERENTIAL/PLATELET
Absolute Monocytes: 715 cells/uL (ref 200–950)
Basophils Absolute: 51 cells/uL (ref 0–200)
Basophils Relative: 0.7 %
Hemoglobin: 14.8 g/dL (ref 11.7–15.5)
Lymphs Abs: 1241 cells/uL (ref 850–3900)
MCH: 29.3 pg (ref 27.0–33.0)
MCV: 89.1 fL (ref 80.0–100.0)
Platelets: 206 10*3/uL (ref 140–400)
RDW: 13.1 % (ref 11.0–15.0)
Total Lymphocyte: 17 %
WBC: 7.3 10*3/uL (ref 3.8–10.8)

## 2023-01-04 NOTE — Assessment & Plan Note (Signed)
MOCA score of 25/30

## 2023-01-04 NOTE — Progress Notes (Addendum)
Established Patient Office Visit  Subjective   Patient ID: Anna [name], female    DOB: 08-23-1936  Age: 87 y.o. MRN: [id number]  Chief Complaint  Patient presents with   Hot Flashes   memory issues    HPI ON Sunday about 10 days ago felt nauseated and dizziness after eating dinner.  She says it did feel like vertigo.  She has had vertigo on and off for years she said it lasted about 15 minutes and then was able to drive home.  Then 2 days later where spoke with her sister on the phone and her sister noted she was struggling with her memory.  That night had a hot flash last Tues night and then Wednesday during the daytime.  She still feels like she is struggling with her memory and she does not feel like that is gotten significantly better even this morning she was telling the driver from [facility] where she needed to come and it really took her a minute to remember the name of our practice even though she is come here for years.  She denies any recent illnesses, head trauma, rashes etc.  She has been dealing with a blister on her soft palate but thinks it might have been from eating cheese its.  It does seem to be healing.  Got shingles vaccine on February 19.  And she is due to get the vaccine again in a week and wanted to know if that was okay.      ROS    Objective:     BP 130/83   Pulse 82   Ht 5\' 2"  (1.575 m)   Wt 178 lb (80.7 kg)   SpO2 93%   BMI 32.56 kg/m     Physical Exam Vitals and nursing note reviewed.  Constitutional:      Appearance: She is well-developed.  HENT:     Head: Normocephalic and atraumatic.     Right Ear: External ear normal.     Left Ear: External ear normal.     Nose: Nose normal.     Mouth/Throat:     Pharynx: Oropharynx is clear.     Comments: The roof of her mouth at the soft palate there is an approximately 8 mm oval-shaped erythematous macular area. Eyes:     Conjunctiva/sclera: Conjunctivae normal.     Pupils: Pupils are equal,  round, and reactive to light.  Neck:     Thyroid: No thyromegaly.  Cardiovascular:     Rate and Rhythm: Normal rate and regular rhythm.     Heart sounds: Normal heart sounds.  Pulmonary:     Effort: Pulmonary effort is normal.     Breath sounds: Normal breath sounds. No wheezing.  Musculoskeletal:     Cervical back: Neck supple.  Lymphadenopathy:     Cervical: No cervical adenopathy.  Skin:    General: Skin is warm and dry.  Neurological:     Mental Status: She is alert and oriented to person, place, and time.  Psychiatric:        Behavior: Behavior normal.      No results found for any visits on 01/04/23.     The ASCVD Risk score (Arnett DK, et al., 2019) failed to calculate for the following reasons:   The 2019 ASCVD risk score is only valid for ages 30 to 21    Assessment & Plan:   Problem List Items Addressed This Visit       Other  Memory change    MOCA score of 25/30      Relevant Orders   COMPLETE METABOLIC PANEL WITH GFR   TSH   CBC with Differential/Platelet   MR Brain W Wo Contrast   Urinalysis, Routine w reflex microscopic   Other Visit Diagnoses     Dizziness    -  Primary   Relevant Orders   COMPLETE METABOLIC PANEL WITH GFR   TSH   CBC with Differential/Platelet   MR Brain W Wo Contrast   Urinalysis, Routine w reflex microscopic   Vertigo       Relevant Orders   COMPLETE METABOLIC PANEL WITH GFR   TSH   CBC with Differential/Platelet   MR Brain W Wo Contrast   Urinalysis, Routine w reflex microscopic   Traumatic blister of mouth, initial encounter       Relevant Orders   COMPLETE METABOLIC PANEL WITH GFR   TSH   CBC with Differential/Platelet   MR Brain W Wo Contrast   Urinalysis, Routine w reflex microscopic       Concerned with the memory change that she may have had a stroke.  Will do blood work today just to check for electrolyte abnormality, thyroid dysfunction, anemia etc.  I will schedule her for brain MRI for further  workup.  Vertigo-unclear if that episode was directly related as she has had a history of vertigo previously.  No follow-ups on file.    Nani Gasser, MD

## 2023-01-05 LAB — COMPLETE METABOLIC PANEL WITH GFR
AG Ratio: 2 (calc) (ref 1.0–2.5)
ALT: 8 U/L (ref 6–29)
AST: 24 U/L (ref 10–35)
Albumin: 4.1 g/dL (ref 3.6–5.1)
Alkaline phosphatase (APISO): 61 U/L (ref 37–153)
BUN: 19 mg/dL (ref 7–25)
CO2: 30 mmol/L (ref 20–32)
Calcium: 9.5 mg/dL (ref 8.6–10.4)
Chloride: 104 mmol/L (ref 98–110)
Creat: 0.76 mg/dL (ref 0.60–0.95)
Globulin: 2.1 g/dL (calc) (ref 1.9–3.7)
Glucose, Bld: 98 mg/dL (ref 65–99)
Potassium: 4.3 mmol/L (ref 3.5–5.3)
Sodium: 143 mmol/L (ref 135–146)
Total Bilirubin: 1 mg/dL (ref 0.2–1.2)
Total Protein: 6.2 g/dL (ref 6.1–8.1)
eGFR: 77 mL/min/{1.73_m2} (ref >=60)

## 2023-01-05 LAB — URINALYSIS, ROUTINE W REFLEX MICROSCOPIC
Bacteria, UA: NONE SEEN /HPF
Bilirubin Urine: NEGATIVE
Glucose, UA: NEGATIVE
Hgb urine dipstick: NEGATIVE
Hyaline Cast: NONE SEEN /LPF
Ketones, ur: NEGATIVE
Nitrite: NEGATIVE
Protein, ur: NEGATIVE
Specific Gravity, Urine: 1.02 (ref 1.001–1.035)
pH: 6 (ref 5.0–8.0)

## 2023-01-05 LAB — TSH: TSH: 0.75 mIU/L (ref 0.40–4.50)

## 2023-01-05 LAB — CBC WITH DIFFERENTIAL/PLATELET
Eosinophils Absolute: 533 cells/uL — ABNORMAL HIGH (ref 15–500)
Eosinophils Relative: 7.3 %
HCT: 45 % (ref 35.0–45.0)
MCHC: 32.9 g/dL (ref 32.0–36.0)
MPV: 11.1 fL (ref 7.5–12.5)
Monocytes Relative: 9.8 %
Neutro Abs: 4760 cells/uL (ref 1500–7800)
Neutrophils Relative %: 65.2 %
RBC: 5.05 10*6/uL (ref 3.80–5.10)

## 2023-01-05 LAB — MICROSCOPIC MESSAGE

## 2023-01-05 NOTE — Progress Notes (Signed)
Call patient: Count looks great no sign of anemia.  Eosinophils are little elevated but that is not unusual during allergy season.  Urine test is clear no sign of infection.  Metabolic panel including liver and kidney function electrolytes are normal.  Thyroid ooks great.

## 2023-01-09 ENCOUNTER — Ambulatory Visit (INDEPENDENT_AMBULATORY_CARE_PROVIDER_SITE_OTHER): Payer: Medicare Other

## 2023-01-09 DIAGNOSIS — R413 Other amnesia: Secondary | ICD-10-CM

## 2023-01-09 DIAGNOSIS — S00522A Blister (nonthermal) of oral cavity, initial encounter: Secondary | ICD-10-CM | POA: Diagnosis not present

## 2023-01-09 DIAGNOSIS — R42 Dizziness and giddiness: Secondary | ICD-10-CM

## 2023-01-09 MED ORDER — GADOBUTROL 1 MMOL/ML IV SOLN
8.0000 mL | Freq: Once | INTRAVENOUS | Status: AC | PRN
  Administered 2023-01-09: 8 mL via INTRAVENOUS

## 2023-01-10 ENCOUNTER — Other Ambulatory Visit: Payer: Self-pay | Admitting: *Deleted

## 2023-01-10 DIAGNOSIS — M159 Polyosteoarthritis, unspecified: Secondary | ICD-10-CM

## 2023-01-10 DIAGNOSIS — M1711 Unilateral primary osteoarthritis, right knee: Secondary | ICD-10-CM

## 2023-01-10 DIAGNOSIS — M19012 Primary osteoarthritis, left shoulder: Secondary | ICD-10-CM

## 2023-01-10 MED ORDER — CELECOXIB 200 MG PO CAPS
200.0000 mg | ORAL_CAPSULE | Freq: Every day | ORAL | 3 refills | Status: DC
Start: 2023-01-10 — End: 2023-06-19

## 2023-01-12 NOTE — Progress Notes (Signed)
Call patient: MRI of her brain shows no sign of hemorrhage or blood.  No sign of recent stroke.  No masses.  Sinuses look clear.  Bones appear to be normal.  There is what looks like a small old lacunar infarct.  It is possible this is from an old stroke or an old injury.  It is hard to say but it again is old and did not occur recently.  Everything else looks reassuring.

## 2023-01-17 DIAGNOSIS — H353221 Exudative age-related macular degeneration, left eye, with active choroidal neovascularization: Secondary | ICD-10-CM | POA: Diagnosis not present

## 2023-02-01 DIAGNOSIS — H353211 Exudative age-related macular degeneration, right eye, with active choroidal neovascularization: Secondary | ICD-10-CM | POA: Diagnosis not present

## 2023-02-10 ENCOUNTER — Ambulatory Visit (INDEPENDENT_AMBULATORY_CARE_PROVIDER_SITE_OTHER): Payer: Medicare Other | Admitting: Sports Medicine

## 2023-02-10 ENCOUNTER — Other Ambulatory Visit (INDEPENDENT_AMBULATORY_CARE_PROVIDER_SITE_OTHER): Payer: Medicare Other

## 2023-02-10 DIAGNOSIS — M170 Bilateral primary osteoarthritis of knee: Secondary | ICD-10-CM | POA: Diagnosis not present

## 2023-02-10 NOTE — Progress Notes (Signed)
    Procedures performed today:    Procedure: Real-time Ultrasound Guided injection of the right knee Device: Samsung HS60  Verbal informed consent obtained.  Time-out conducted.  Noted no overlying erythema, induration, or other signs of local infection.  Skin prepped in a sterile fashion.  Local anesthesia: Topical Ethyl chloride.  With sterile technique and under real time ultrasound guidance: Trace effusion noted, 1 cc Kenalog 40, 2 cc lidocaine, 2 cc bupivacaine injected easily Completed without difficulty  Advised to call if fevers/chills, erythema, induration, drainage, or persistent bleeding.  Images permanently stored and available for review in PACS.  Impression: Technically successful ultrasound guided injection.  Procedure: Real-time Ultrasound Guided injection of the left knee Device: Samsung HS60  Verbal informed consent obtained.  Time-out conducted.  Noted no overlying erythema, induration, or other signs of local infection.  Skin prepped in a sterile fashion.  Local anesthesia: Topical Ethyl chloride.  With sterile technique and under real time ultrasound guidance: Trace effusion noted, 1 cc Kenalog 40, 2 cc lidocaine, 2 cc bupivacaine injected easily Completed without difficulty  Advised to call if fevers/chills, erythema, induration, drainage, or persistent bleeding.  Images permanently stored and available for review in PACS.  Impression: Technically successful ultrasound guided injection.  Independent interpretation of notes and tests performed by another provider:   None.  Brief History, Exam, Impression, and Recommendations:    Primary osteoarthritis of both knees This is a very pleasant 87 year old female, known bilateral knee osteoarthritis, last injection was right-sided in February, now with recurrence of pain without locking, she is taking Celebrex and Tylenol, we did bilateral knee injections today, she was given information on Orthovisc, she will  come back in 2 months and if persistent discomfort we will consider Orthovisc approval.    ____________________________________________ Ihor Austin. Benjamin Stain, M.D., ABFM., CAQSM., AME. Primary Care and Sports Medicine [facility]  Adjunct Professor of Family Medicine  [employer] [facility]  Restaurant manager, fast food

## 2023-02-10 NOTE — Assessment & Plan Note (Signed)
This is a very pleasant 87 year old female, known bilateral knee osteoarthritis, last injection was right-sided in February, now with recurrence of pain without locking, she is taking Celebrex and Tylenol, we did bilateral knee injections today, she was given information on Orthovisc, she will come back in 2 months and if persistent discomfort we will consider Orthovisc approval.

## 2023-02-28 DIAGNOSIS — H353221 Exudative age-related macular degeneration, left eye, with active choroidal neovascularization: Secondary | ICD-10-CM | POA: Diagnosis not present

## 2023-03-15 ENCOUNTER — Other Ambulatory Visit: Payer: Self-pay | Admitting: Family Medicine

## 2023-04-07 ENCOUNTER — Ambulatory Visit (INDEPENDENT_AMBULATORY_CARE_PROVIDER_SITE_OTHER): Payer: Medicare Other | Admitting: Family Medicine

## 2023-04-07 ENCOUNTER — Ambulatory Visit (INDEPENDENT_AMBULATORY_CARE_PROVIDER_SITE_OTHER): Payer: Medicare Other

## 2023-04-07 ENCOUNTER — Encounter: Payer: Self-pay | Admitting: Family Medicine

## 2023-04-07 VITALS — BP 135/64 | HR 66 | Ht 62.0 in | Wt 181.0 lb

## 2023-04-07 DIAGNOSIS — R413 Other amnesia: Secondary | ICD-10-CM | POA: Diagnosis not present

## 2023-04-07 DIAGNOSIS — R2990 Unspecified symptoms and signs involving the nervous system: Secondary | ICD-10-CM

## 2023-04-07 DIAGNOSIS — R29898 Other symptoms and signs involving the musculoskeletal system: Secondary | ICD-10-CM

## 2023-04-07 DIAGNOSIS — M4310 Spondylolisthesis, site unspecified: Secondary | ICD-10-CM | POA: Diagnosis not present

## 2023-04-07 DIAGNOSIS — I10 Essential (primary) hypertension: Secondary | ICD-10-CM | POA: Diagnosis not present

## 2023-04-07 DIAGNOSIS — M4802 Spinal stenosis, cervical region: Secondary | ICD-10-CM | POA: Diagnosis not present

## 2023-04-07 DIAGNOSIS — R7301 Impaired fasting glucose: Secondary | ICD-10-CM | POA: Diagnosis not present

## 2023-04-07 DIAGNOSIS — R053 Chronic cough: Secondary | ICD-10-CM

## 2023-04-07 DIAGNOSIS — M5032 Other cervical disc degeneration, mid-cervical region, unspecified level: Secondary | ICD-10-CM | POA: Diagnosis not present

## 2023-04-07 DIAGNOSIS — M47812 Spondylosis without myelopathy or radiculopathy, cervical region: Secondary | ICD-10-CM | POA: Diagnosis not present

## 2023-04-07 NOTE — Progress Notes (Signed)
Established Patient Office Visit  Subjective   Patient ID: Anna [name], female    DOB: December 07, 1935  Age: 87 y.o. MRN: [id number]  Chief Complaint  Patient presents with   Results    HPI  She is here today for follow-up I saw her recently for a visit where she was having episodes of dizziness and vertigo but she was also concerned about some memory changes.  MoCA score was 25 out of 30.  Did do a follow-up MRI in April which was negative for any acute findings but did show an old possible lacunar stroke.  Labs in April were normal and reassuring including a thyroid level.  She said since I last saw her she had another episode on June 15 she was on the phone with her sister and she just felt very exhausted weak count of out of it.  She says that pretty much lasted all day she stayed in her pajamas and really did not do anything but felt better by the next day.  She read online that the injections that she has been getting in her eye called Eylea can cause a stroke and she was concerned so she canceled her injection for later in June and July.  She is also had some tingling in the right side of her neck and feels like she is getting some intermittent weakness in her right arm.  Impaired fasting glucose-no increased thirst or urination. No symptoms consistent with hypoglycemia.  Hypertension- Pt denies chest pain, SOB, dizziness, or heart palpitations.  Taking meds as directed w/o problems.  Denies medication side effects.     No chest pain or palpitations or tachycardia.    ROS    Objective:     BP 135/64   Pulse 66   Ht 5\' 2"  (1.575 m)   Wt 181 lb (82.1 kg)   SpO2 94%   BMI 33.11 kg/m    Physical Exam Vitals and nursing note reviewed.  Constitutional:      Appearance: She is well-developed.  HENT:     Head: Normocephalic and atraumatic.  Cardiovascular:     Rate and Rhythm: Normal rate and regular rhythm.     Heart sounds: Normal heart sounds.  Pulmonary:      Effort: Pulmonary effort is normal.     Breath sounds: Normal breath sounds.  Musculoskeletal:     Comments: Strength in both UE 5/5 at shoulder, elbow and wrist.   Skin:    General: Skin is warm and dry.  Neurological:     Mental Status: She is alert and oriented to person, place, and time.  Psychiatric:        Behavior: Behavior normal.     No results found for any visits on 04/07/23.    The ASCVD Risk score (Arnett DK, et al., 2019) failed to calculate for the following reasons:   The 2019 ASCVD risk score is only valid for ages 58 to 8    Assessment & Plan:   Problem List Items Addressed This Visit       Cardiovascular and Mediastinum   Primary hypertension    BP at goal.  F/U in 6 mo         Endocrine   IFG (impaired fasting glucose)    Lab Results  Component Value Date   HGBA1C 5.9 (A) 10/25/2022   AT goal. F/U in 6 mo         Other   Memory change - Primary  MOCA score 25/30. Having stroke like epsides. Consider medication side effect Will check on profil.  Stroke it listed.  MRI was reassuring but doesn't rule out TIA. Will get [place] of carotids and echo for further workup.       Relevant Orders   US Carotid Duplex Bilateral   ECHOCARDIOGRAM COMPLETE   Other Visit Diagnoses     Chronic cough       Stroke-like symptoms       Relevant Orders   US Carotid Duplex Bilateral   ECHOCARDIOGRAM COMPLETE   Right arm weakness       Relevant Orders   DG Cervical Spine Complete      Right arm weakness - likely of cervical origin since radiates down the arm. Will get xray of neck for further workup.    Did evaluate the Eylea which showed potential risk of Arterial thromboembolic events was seen in . 3 out of 336 pt with a thrombolic event. So still not sure if cause or not so will work up further.    Return in about 6 weeks (around 05/19/2023) for follow up on testing .    Nani Gasser, MD

## 2023-04-10 NOTE — Assessment & Plan Note (Signed)
BP at goal.  F/U in 6 mo

## 2023-04-10 NOTE — Assessment & Plan Note (Signed)
Lab Results  Component Value Date   HGBA1C 5.9 (A) 10/25/2022   AT goal. F/U in 6 mo

## 2023-04-10 NOTE — Assessment & Plan Note (Signed)
MOCA score 25/30. Having stroke like epsides. Consider medication side effect Will check on profil.  Stroke it listed.  MRI was reassuring but doesn't rule out TIA. Will get [place] of carotids and echo for further workup.

## 2023-04-12 ENCOUNTER — Ambulatory Visit: Payer: Medicare Other | Admitting: Sports Medicine

## 2023-04-13 ENCOUNTER — Ambulatory Visit (HOSPITAL_BASED_OUTPATIENT_CLINIC_OR_DEPARTMENT_OTHER)
Admission: RE | Admit: 2023-04-13 | Discharge: 2023-04-13 | Disposition: A | Discharge status: Home / Self Care | Payer: Medicare Other | Source: Ambulatory Visit | Attending: Family Medicine | Admitting: Family Medicine

## 2023-04-13 DIAGNOSIS — R413 Other amnesia: Secondary | ICD-10-CM

## 2023-04-13 DIAGNOSIS — R2990 Unspecified symptoms and signs involving the nervous system: Secondary | ICD-10-CM | POA: Diagnosis not present

## 2023-04-13 DIAGNOSIS — I771 Stricture of artery: Secondary | ICD-10-CM | POA: Diagnosis not present

## 2023-04-13 DIAGNOSIS — I10 Essential (primary) hypertension: Secondary | ICD-10-CM | POA: Diagnosis not present

## 2023-04-13 DIAGNOSIS — I708 Atherosclerosis of other arteries: Secondary | ICD-10-CM | POA: Diagnosis not present

## 2023-04-13 DIAGNOSIS — I6521 Occlusion and stenosis of right carotid artery: Secondary | ICD-10-CM | POA: Diagnosis not present

## 2023-04-17 NOTE — Progress Notes (Signed)
Anna [name], he did have just a very small amount of plaque in the right carotid.  It is not enough to cause a problem more blocked flow or increase your risk for stroke which is very reassuring.  The left side looked great.

## 2023-04-18 NOTE — Progress Notes (Signed)
Hi Meshell, your x-ray shows moderate to more severe arthritis in that mid to lower part of your neck.  You do have some degenerative disc that looks a little bit worse compared to your prior film back in December.  You have some arthritis in the hinge joints as well and a little bit of narrowing on the right side at C3-4 and C4-5 with the nerve root comes out.  Again looks a little bit worse than your prior film.  You are also having that tingling and numbness in your right arm and like to get you in with Dr. Benjamin Stain for further workup.

## 2023-04-20 ENCOUNTER — Ambulatory Visit: Payer: Medicare Other | Admitting: Family Medicine

## 2023-04-27 ENCOUNTER — Ambulatory Visit (HOSPITAL_BASED_OUTPATIENT_CLINIC_OR_DEPARTMENT_OTHER)
Admission: RE | Admit: 2023-04-27 | Discharge: 2023-04-27 | Disposition: A | Discharge status: Home / Self Care | Payer: Medicare Other | Source: Ambulatory Visit | Attending: Family Medicine | Admitting: Family Medicine

## 2023-04-27 DIAGNOSIS — R413 Other amnesia: Secondary | ICD-10-CM | POA: Diagnosis not present

## 2023-04-27 DIAGNOSIS — R2990 Unspecified symptoms and signs involving the nervous system: Secondary | ICD-10-CM | POA: Diagnosis not present

## 2023-04-27 DIAGNOSIS — I351 Nonrheumatic aortic (valve) insufficiency: Secondary | ICD-10-CM | POA: Insufficient documentation

## 2023-04-27 DIAGNOSIS — I10 Essential (primary) hypertension: Secondary | ICD-10-CM

## 2023-04-27 LAB — ECHOCARDIOGRAM COMPLETE
Area-P 1/2: 3.12 cm2
S' Lateral: 3 cm

## 2023-04-28 NOTE — Progress Notes (Signed)
Hi Anna [name], echocardiogram of your heart shows good pumping function with an EF of 60 to 65% which is normal.  Labs overall look great as well.  No concerning findings on the echo.

## 2023-05-01 ENCOUNTER — Ambulatory Visit (INDEPENDENT_AMBULATORY_CARE_PROVIDER_SITE_OTHER): Payer: Medicare Other

## 2023-05-01 ENCOUNTER — Ambulatory Visit (INDEPENDENT_AMBULATORY_CARE_PROVIDER_SITE_OTHER): Payer: Medicare Other | Admitting: Sports Medicine

## 2023-05-01 DIAGNOSIS — M5030 Other cervical disc degeneration, unspecified cervical region: Secondary | ICD-10-CM | POA: Diagnosis not present

## 2023-05-01 DIAGNOSIS — I700 Atherosclerosis of aorta: Secondary | ICD-10-CM | POA: Diagnosis not present

## 2023-05-01 DIAGNOSIS — M170 Bilateral primary osteoarthritis of knee: Secondary | ICD-10-CM

## 2023-05-01 DIAGNOSIS — G8929 Other chronic pain: Secondary | ICD-10-CM | POA: Diagnosis not present

## 2023-05-01 DIAGNOSIS — M545 Low back pain, unspecified: Secondary | ICD-10-CM

## 2023-05-01 DIAGNOSIS — M25462 Effusion, left knee: Secondary | ICD-10-CM | POA: Diagnosis not present

## 2023-05-01 DIAGNOSIS — M5136 Other intervertebral disc degeneration, lumbar region: Secondary | ICD-10-CM | POA: Diagnosis not present

## 2023-05-01 MED ORDER — PREDNISONE 50 MG PO TABS: ORAL_TABLET | ORAL | 0 refills | Status: DC

## 2023-05-01 NOTE — Progress Notes (Signed)
    Procedures performed today:    None.  Independent interpretation of notes and tests performed by another provider:   None.  Brief History, Exam, Impression, and Recommendations:    DDD (degenerative disc disease), cervical Anna [name] returns, she is a very pleasant 87 year old female, she is having recurrence of axial neck pain, with right sided periscapular paresthesias occasionally rating down to the upper arm, she has done well conservatively in the past, we will restart conservative treatment, 5 days of prednisone, hold Celebrex but restart after finishing prednisone, formal PT. Return to see me in 4 to 6 weeks, MRI for interventional planning if not better.  Primary osteoarthritis of both knees Worsening pain bilateral knee osteoarthritis, prednisone will help, she can then continue her Celebrex. She will do some physical therapy for her knees, and will consider steroid injections if not better. The last x-rays were 2 years ago so we will get updated x-rays today. Last injection was bilateral in May of this year.  Chronic bilateral low back pain without sciatica Lumbar DDD, did well with therapy at the last visit, we will restart conservative treatment with prednisone, formal PT, I would like some updated x-rays, return to see me in 4 to 6 weeks, MRI for interventional planning if not better.    ____________________________________________ Anna [name]. Benjamin Stain, M.D., ABFM., CAQSM., AME. Primary Care and Sports Medicine [facility]  Adjunct Professor of Family Medicine  [employer]  Restaurant manager, fast food

## 2023-05-01 NOTE — Assessment & Plan Note (Signed)
Lumbar DDD, did well with therapy at the last visit, we will restart conservative treatment with prednisone, formal PT, I would like some updated x-rays, return to see me in 4 to 6 weeks, MRI for interventional planning if not better.

## 2023-05-01 NOTE — Assessment & Plan Note (Signed)
Worsening pain bilateral knee osteoarthritis, prednisone will help, she can then continue her Celebrex. She will do some physical therapy for her knees, and will consider steroid injections if not better. The last x-rays were 2 years ago so we will get updated x-rays today. Last injection was bilateral in May of this year.

## 2023-05-01 NOTE — Assessment & Plan Note (Signed)
Anna [name] returns, she is a very pleasant 87 year old female, she is having recurrence of axial neck pain, with right sided periscapular paresthesias occasionally rating down to the upper arm, she has done well conservatively in the past, we will restart conservative treatment, 5 days of prednisone, hold Celebrex but restart after finishing prednisone, formal PT. Return to see me in 4 to 6 weeks, MRI for interventional planning if not better.

## 2023-05-02 ENCOUNTER — Telehealth: Payer: Self-pay | Admitting: Family Medicine

## 2023-05-02 NOTE — Telephone Encounter (Signed)
Yes, okay to stop.  But I would please find out why she is stopping it for example if she is having side effects.  If she is then we need to be able to add that to her intolerance list.

## 2023-05-02 NOTE — Telephone Encounter (Signed)
Patient called she is asking if she can stop taking medication Myrbetriq 25mg  Please advise

## 2023-05-03 ENCOUNTER — Telehealth: Payer: Self-pay | Admitting: Sports Medicine

## 2023-05-03 NOTE — Telephone Encounter (Signed)
Patient called in wanting know when she finishes her 5 day prednisone does she pick up Celebrex.

## 2023-05-03 NOTE — Telephone Encounter (Signed)
error 

## 2023-05-03 NOTE — Telephone Encounter (Signed)
Attempted call to patient. Left a voice mail message requesting a return call.  

## 2023-05-05 NOTE — Telephone Encounter (Signed)
error 

## 2023-05-10 ENCOUNTER — Ambulatory Visit: Payer: Medicare Other | Attending: Sports Medicine | Admitting: Rehabilitative and Restorative Service Providers"

## 2023-05-10 ENCOUNTER — Other Ambulatory Visit: Payer: Self-pay

## 2023-05-10 ENCOUNTER — Encounter: Payer: Self-pay | Admitting: Rehabilitative and Restorative Service Providers"

## 2023-05-10 DIAGNOSIS — R293 Abnormal posture: Secondary | ICD-10-CM | POA: Insufficient documentation

## 2023-05-10 DIAGNOSIS — M5030 Other cervical disc degeneration, unspecified cervical region: Secondary | ICD-10-CM | POA: Insufficient documentation

## 2023-05-10 DIAGNOSIS — R531 Weakness: Secondary | ICD-10-CM | POA: Insufficient documentation

## 2023-05-10 DIAGNOSIS — M436 Torticollis: Secondary | ICD-10-CM | POA: Insufficient documentation

## 2023-05-10 DIAGNOSIS — M5459 Other low back pain: Secondary | ICD-10-CM | POA: Diagnosis present

## 2023-05-10 DIAGNOSIS — R29898 Other symptoms and signs involving the musculoskeletal system: Secondary | ICD-10-CM | POA: Diagnosis present

## 2023-05-10 DIAGNOSIS — M542 Cervicalgia: Secondary | ICD-10-CM | POA: Insufficient documentation

## 2023-05-10 NOTE — Therapy (Addendum)
OUTPATIENT PHYSICAL THERAPY CERVICAL EVALUATION AND DISCHARGE SUMMARY   PHYSICAL THERAPY DISCHARGE SUMMARY  Visits from Start of Care: Eval only   Current functional level related to goals / functional outcomes: See Evaluation    Remaining deficits: Unknown    Education / Equipment: HEP    Patient agrees to discharge. Patient goals were not met. Patient is being discharged due to not returning since the last visit.  Francessca Friis P. Leonor Liv PT, MPH 08/17/23 4:02 PM   Patient Name: Anna [name] MRN: [id number] DOB:[birth year], 87 y.o., female Today's Date: 05/10/2023  END OF SESSION:  PT End of Session - 05/10/23 1531     Visit Number 1    Number of Visits 24    Date for PT Re-Evaluation 08/02/23    Authorization Type [employer] Medicare $20 copay    Progress Note Due on Visit 10    PT Start Time 1400    PT Stop Time 1453    PT Time Calculation (min) 53 min    Activity Tolerance Patient tolerated treatment well             Past Medical History:  Diagnosis Date   Dermatitis    seborrehic scalp   Hiatal hernia    History of basal cell carcinoma 07/20/2017   On nose.    History of melanoma 07/20/2017   Right upper arm    Hyperlipidemia    Macular degeneration    Past Surgical History:  Procedure Laterality Date   BREAST CYST EXCISION Right 09/1962   CATARACT EXTRACTION  03/2015   melonoma Right 06/2016   arm   MOHS SURGERY Left 06/2016   nose   Patient Active Problem List   Diagnosis Date Noted   Memory change 01/04/2023   Noninfected skin tear of leg, right, initial encounter 06/30/2022   Primary hypertension 02/07/2022   Anxious depression 02/07/2022   DDD (degenerative disc disease), cervical 05/04/2021   Neck pain 05/03/2021   Anorexia 06/30/2020   Umbilical hernia without obstruction and without gangrene 06/30/2020   Meralgia paresthetica of right side 06/22/2020   Venous insufficiency of both lower extremities 06/22/2020   Primary osteoarthritis, left  shoulder 06/03/2020   Bilateral lower extremity edema 05/25/2020   Aortic atherosclerosis (HCC) 05/13/2020   Bunion, right foot 11/22/2019   Lichen planus 11/22/2019   Asthma in adult 11/22/2019   Mixed restrictive and obstructive lung disease (HCC) 11/22/2019   Primary osteoarthritis of both knees 11/22/2019   Acute pain of left shoulder 09/05/2019   Chronic bilateral low back pain without sciatica 09/05/2019   OAB (overactive bladder) 08/08/2019   Insomnia 08/08/2019   History of melanoma 07/20/2017   History of basal cell carcinoma 07/20/2017   IFG (impaired fasting glucose) 07/17/2017   Macular degeneration    Chronic fatigue 08/06/2015   Gastroesophageal reflux disease without esophagitis 08/06/2015   Glaucoma 08/06/2015   Hiatal hernia 08/06/2015   Melanoma of right upper arm (HCC) 08/06/2015   Mixed hyperlipidemia 08/06/2015   Rupture of right biceps tendon 08/06/2015   Biceps tendonitis of both shoulders 08/06/2015    PCP: Dr Nani Gasser  REFERRING PROVIDER: Dr Rodney Langton  REFERRING DIAG: Cervical DDD  THERAPY DIAG:  DDD (degenerative disc disease), cervical  Other low back pain  Abnormal posture  Other symptoms and signs involving the musculoskeletal system  Cervicalgia  Weakness generalized  Stiffness of cervical spine  Rationale for Evaluation and Treatment: Rehabilitation  ONSET DATE: 02/25/23 neck  SUBJECTIVE:  SUBJECTIVE STATEMENT: Noticed onset of tingling in the back of the neck R > L for the past 2 months with no known injury. She was on prednisone for 5 days and did notice decreased pain in the neck. She has some tingling in the neck but not as intense as it has been.  She has had LBP for the past 2 years with no known injury. She had  xrays that show deterioration of the lumbar region   Hand dominance: Right  PERTINENT HISTORY:  Episode of neck pain 8/22- 10/22 resolved with PT and HEP; abdominal surgery 02/16/21; arthritis; osteoporesis; anxiety; shoulder pain   PAIN:  Are you having pain? Yes: NPRS scale: 0/10; up to 2/10 in the past couple of days  Pain location: posterior neck area  Pain description: tingling  Aggravating factors: unknown  Relieving factors: prednisone    Are you having pain? Yes: NPRS scale: 0/10; up to 7-8/10 on a regular basis   Pain location: bilat lumbar at waist  Pain description: nagging  Aggravating factors: standing > 5-10 min Relieving factors: medication; heating pad; rest   PRECAUTIONS: None  RED FLAGS: None     WEIGHT BEARING RESTRICTIONS: No  FALLS:  Has patient fallen in last 6 months? No  LIVING ENVIRONMENT: Lives with: lives alone Lives in: House/apartment  OCCUPATION: retired Engineer, manufacturing; still plays piano; household chores; walking 1 day a week; TV; sits in recliner    PLOF: Independent  PATIENT GOALS: decrease pain and get to an exercise routine - bored with exercises   NEXT MD VISIT: Dr Linford Arnold 05/22/23  OBJECTIVE:   DIAGNOSTIC FINDINGS:  Xray 04/07/23: IMPRESSION: 1. Moderate to marked multilevel mid to lower cervical degenerative disc disease, slightly worsened. 2. Extensive bilateral cervical facet arthropathy. 3. Mild degenerative neural foraminal stenosis on the right at C3-4 and C4-5, slightly worsened.  PATIENT SURVEYS:  FOTO 44; goal 14  COGNITION: Overall cognitive status: Within functional limits for tasks assessed  SENSATION: WFL  POSTURE: Patient presents with head forward posture with increased thoracic kyphosis; shoulders rounded and elevated; scapulae abducted and rotated along the thoracic spine; head of the humerus anterior in orientation.   PALPATION: Tightness L > R ant/lat/posterior cervical musculature; bilat  posterior buttocks through gluts and piriformis    CERVICAL ROM:   Active ROM A/PROM (deg) eval  Flexion 48  Extension 38 pull L  Right lateral flexion 19 tight  Left lateral flexion 23 tight  Right rotation 43 tight   Left rotation 42 tight    (Blank rows = not tested)  LUMBAR ROM:  Active ROM A/PROM (deg) eval  Flexion 70% pull  Extension 50%  Right lateral flexion 70%  Left lateral flexion 70%  Right rotation 25%  Left rotation 2    UPPER/LOWER EXTREMITY STRENGTH: strength assessed in supine and sidelying - patient unable to lie prone in clinic   Active ROM Right eval Left eval  Shoulder flexion 4 4  Shoulder extension 5 5  Shoulder abduction 4 4  Shoulder adduction    Shoulder extension    Shoulder internal rotation 5 5  Shoulder external rotation 4 4      Hip flexion  4 4-  Hip abduction 4 4-  Hip adduction  4 4-                   (Blank rows = not tested)  UPPER/LOWER EXTREMITY ROM: decreased end range mobility bilat shoulders; tightness in bilat hips L >  R extension; abduction; tight ankle DF    SPECIAL TESTS:  5 times sit to stand - 25.87 sec   [facility] Adult PT Treatment:                                                DATE: 05/10/23 Therapeutic Exercise: Standing  Trunk extension 3 sec x 3  Heel raises 3 sec x 10  Toe raises 3 sec x 10  Single leg stance 10 sec x 3 R/L   Myofacial ball release work bilat posterior hips  Supine  Piriformis stretch travell 30 sec x 2 R/L Hamstring stretch with strap 30 sec x 2 R/L  Sitting  Scap squeeze w/coregeous ball 10 sec x 5 Chin tuck w/ coregeous ball 10 sec x 5  Neuromuscular re-ed: Postural correction sitting and standing  Self Care: Importance of exercise    PATIENT EDUCATION:  Education details: POC; HEP Person educated: Patient Education method: Programmer, multimedia, Demonstration, Tactile cues, Verbal cues, and Handouts Education comprehension: verbalized understanding, returned demonstration, verbal  cues required, tactile cues required, and needs further education  HOME EXERCISE PROGRAM: Access Code: [email or web address] URL: [email or web address] Date: 05/10/2023 Prepared by: Corlis Leak  Exercises - Standing Lumbar Extension  - 2 x daily - 7 x weekly - 1 sets - 2-3 reps - 2-3 sec  hold - Supine Piriformis Stretch with Leg Straight  - 2 x daily - 7 x weekly - 1 sets - 3 reps - 30 sec  hold - Hooklying Hamstring Stretch with Strap  - 2 x daily - 7 x weekly - 1 sets - 3 reps - 30 sec  hold - Seated Scapular Retraction  - 2 x daily - 7 x weekly - 1-2 sets - 10 reps - 10 sec  hold - Seated Cervical Retraction  - 2 x daily - 7 x weekly - 1-2 sets - 5-10 reps - 10 sec  hold - Single Leg Stance with Support  - 2 x daily - 7 x weekly - 1 sets - 3 reps - 30 sec  hold - Heel Raise  - 2 x daily - 7 x weekly - 1 sets - 10 reps - 3 sec  hold - Heel Toe Raises with Counter Support  - 2 x daily - 7 x weekly - 1 sets - 5-10 reps - 3 sec  hold - Standing Piriformis Release with Ball at Wall  - 2 x daily - 7 x weekly - 30-60 sec  hold  Patient Education - [facility]  ASSESSMENT:  CLINICAL IMPRESSION: Patient is a 87 y.o. female who was seen today for physical therapy evaluation and treatment for cervical dysfunction with history of DDD; and LBP. She presents with poor posture and alignment; limited cervical and lumbar ROM/mobility; weakness in trunk/core and U/LE's; decreased balance; decreased functional activities; pain on a daily basis. Patient does not like exercises but is interested in working to learn an aquatic therapy program and feels she will be consistent with a pool program. Patient will benefit from PT to address problems identified.   OBJECTIVE IMPAIRMENTS: decreased activity tolerance, decreased mobility, decreased ROM, decreased strength, hypomobility, increased fascial restrictions, increased muscle spasms, impaired flexibility, impaired UE functional use, improper body  mechanics, postural dysfunction, and pain.   ACTIVITY LIMITATIONS: carrying, lifting, and reach over head  PARTICIPATION LIMITATIONS: meal prep, cleaning, laundry,  shopping, and community activity  PERSONAL FACTORS: Age, Fitness, Past/current experiences, and Time since onset of injury/illness/exacerbation are also affecting patient's functional outcome.   REHAB POTENTIAL: Good  CLINICAL DECISION MAKING: Stable/uncomplicated  EVALUATION COMPLEXITY: Low   GOALS: Goals reviewed with patient? Yes  SHORT TERM GOALS: Target date: 06/21/2023   Independent in initial HEP  Baseline:  Goal status: INITIAL  2.  Improve exercise tolerance with patient to tolerate 20-30 min of exercise in clinic  Baseline:  Goal status: INITIAL   LONG TERM GOALS: Target date: 08/02/2023   Improve posture and alignment with patient to demonstrate improve upright posture with posterior shoulder girdle musculature engaged  Baseline:  Goal status: INITIAL  2.  Increase cervical ROM by 3-8 degrees in all planes of movement  Baseline:  Goal status: INITIAL  3.  Increase U/LE strength to 4+ to 5 throughout  Baseline:  Goal status: INITIAL  4.  Decrease frequency, intensity, duration of pain by 25-50% allowing patient to increase physical activity and ADL's  Baseline:  Goal status: INITIAL  5.  Independent in HEP including aquatic therapy as indicated  Baseline:  Goal status: INITIAL  6.  Improve functional limitation score to 58 Baseline: 44 Goal status: INITIAL   PLAN:  PT FREQUENCY: 2x/week  PT DURATION: 12 weeks  PLANNED INTERVENTIONS: Therapeutic exercises, Therapeutic activity, Neuromuscular re-education, Balance training, Gait training, Patient/Family education, Self Care, Joint mobilization, Aquatic Therapy, Dry Needling, Electrical stimulation, Spinal mobilization, Cryotherapy, Moist heat, Taping, Traction, Ultrasound, Ionotophoresis 4mg /ml Dexamethasone, Manual therapy, and  Re-evaluation  PLAN FOR NEXT SESSION: review and progress exercises; continue with spine care education and postural correction; manual work, DN, modalities as indicated    [employer], PT 05/10/2023, 3:33 PM

## 2023-05-11 ENCOUNTER — Other Ambulatory Visit: Payer: Self-pay | Admitting: Sports Medicine

## 2023-05-11 DIAGNOSIS — G8929 Other chronic pain: Secondary | ICD-10-CM

## 2023-05-11 DIAGNOSIS — M545 Low back pain, unspecified: Secondary | ICD-10-CM

## 2023-05-12 ENCOUNTER — Telehealth: Payer: Self-pay | Admitting: Family Medicine

## 2023-05-12 NOTE — Telephone Encounter (Signed)
Patient called although she has stopped taking her Myrbetriq 25mg  her pharmacy needs notification patient is no longer on this please submit to  [facility] [place] Phone number is [phone]

## 2023-05-15 DIAGNOSIS — H401131 Primary open-angle glaucoma, bilateral, mild stage: Secondary | ICD-10-CM | POA: Diagnosis not present

## 2023-05-15 DIAGNOSIS — H353122 Nonexudative age-related macular degeneration, left eye, intermediate dry stage: Secondary | ICD-10-CM | POA: Diagnosis not present

## 2023-05-15 DIAGNOSIS — H353211 Exudative age-related macular degeneration, right eye, with active choroidal neovascularization: Secondary | ICD-10-CM | POA: Diagnosis not present

## 2023-05-16 ENCOUNTER — Encounter: Payer: Medicare Other | Admitting: Rehabilitative and Restorative Service Providers"

## 2023-05-17 ENCOUNTER — Other Ambulatory Visit: Payer: Self-pay | Admitting: Family Medicine

## 2023-05-17 NOTE — Telephone Encounter (Signed)
Spoke w/Teresa and she discontinued this medication for the patient

## 2023-05-18 ENCOUNTER — Encounter: Payer: Medicare Other | Admitting: Rehabilitative and Restorative Service Providers"

## 2023-05-22 ENCOUNTER — Ambulatory Visit (INDEPENDENT_AMBULATORY_CARE_PROVIDER_SITE_OTHER): Payer: Medicare Other | Admitting: Family Medicine

## 2023-05-22 VITALS — BP 134/73 | HR 75 | Wt 180.0 lb

## 2023-05-22 DIAGNOSIS — R0989 Other specified symptoms and signs involving the circulatory and respiratory systems: Secondary | ICD-10-CM

## 2023-05-22 DIAGNOSIS — Z23 Encounter for immunization: Secondary | ICD-10-CM | POA: Diagnosis not present

## 2023-05-22 DIAGNOSIS — I10 Essential (primary) hypertension: Secondary | ICD-10-CM | POA: Diagnosis not present

## 2023-05-22 DIAGNOSIS — N3281 Overactive bladder: Secondary | ICD-10-CM | POA: Diagnosis not present

## 2023-05-22 DIAGNOSIS — R413 Other amnesia: Secondary | ICD-10-CM | POA: Diagnosis not present

## 2023-05-22 DIAGNOSIS — R200 Anesthesia of skin: Secondary | ICD-10-CM

## 2023-05-22 NOTE — Progress Notes (Unsigned)
Established Patient Office Visit  Subjective   Patient ID: Anna [name], female    DOB: Apr 30, 1936  Age: 87 y.o. MRN: [id number]  Chief Complaint  Patient presents with   Follow-up         HPI  F/U stroke like sxs. Since stopping the Eylea she hasn't had any more episodes. WE did echo, carotid dopplers, MR and labs. All were unrevealing .   Hypertension- Pt denies chest pain, SOB, dizziness, or heart palpitations.  Taking meds as directed w/o problems.  Denies medication side effects.    She is also at the [facility] now but reports that the pharmacy needs an actual order to discontinue it.  They continue to deliver it and it is costing her $60 a month.  He also wanted to let me know that she still having some choking experiences occasionally no difficulty swallowing.  But she will sometimes get easily choked while eating.  Also reports numbness on the bottoms of her feet.  When it happens it is the entire bottom of the foot it is not just the toes or distal foot.  She says it does come and go.  She has not noticed a specific pattern to it.  She occasionally will notice it at night but sometimes happens during the day.     ROS    Objective:     BP 134/73   Pulse 75   Wt 180 lb (81.6 kg)   SpO2 94%   BMI 32.92 kg/m    Physical Exam Vitals and nursing note reviewed.  Constitutional:      Appearance: Normal appearance.  HENT:     Head: Normocephalic and atraumatic.  Eyes:     Conjunctiva/sclera: Conjunctivae normal.  Cardiovascular:     Rate and Rhythm: Normal rate and regular rhythm.  Pulmonary:     Effort: Pulmonary effort is normal.     Breath sounds: Normal breath sounds.  Skin:    General: Skin is warm and dry.  Neurological:     Mental Status: She is alert.  Psychiatric:        Mood and Affect: Mood normal.      No results found for any visits on 05/22/23.    The ASCVD Risk score (Arnett DK, et al., 2019) failed to calculate for the following  reasons:   The 2019 ASCVD risk score is only valid for ages 67 to 58    Assessment & Plan:   Problem List Items Addressed This Visit       Cardiovascular and Mediastinum   Primary hypertension    At goal.         Genitourinary   OAB (overactive bladder)    Will send an order to [facility] pharmacy to cancel prescription for Myrbetriq.        Other   Memory change   Other Visit Diagnoses     Encounter for immunization    -  Primary   Relevant Orders   Flu Vaccine Trivalent High Dose (Fluad) (Completed)   Numbness of foot       Choking episode          Numbness of feet-it seems to be intermittent and only on the bottoms.  So it is not feel fitting a typical neuropathy type distribution.  We discussed maybe ping attention to shoe wear to see if maybe there is a certain pair of shoes that is triggering it or if it happens more with prolonged sitting or standing.  She does have a history of chronic low back pain so could be radicular in nature.  She says she will keep an eye on it and let me know.  Choking episodes typically triggered while eating.  We discussed maybe just slowing down and making sure that she is chewing very thoroughly.  And not trying to talk and eat at the same time.  She does live in assisted living center so she is often eating in a social environment.  Also discussed them happy to refer her to GI for further workup but she declines today and says she will just try to pay more attention to chewing well and trying to focus on that first.  No report of difficulty swallowing.  Return in about 6 months (around 11/08/2023) for Hypertension.   I spent 25 minutes on the day of the encounter to include pre-visit record review, face-to-face time with the patient and post visit ordering of test.  Nani Gasser, MD

## 2023-05-23 ENCOUNTER — Encounter: Payer: Medicare Other | Admitting: Rehabilitative and Restorative Service Providers"

## 2023-05-23 NOTE — Assessment & Plan Note (Signed)
Will send an order to [facility] pharmacy to cancel prescription for Myrbetriq.

## 2023-05-23 NOTE — Assessment & Plan Note (Signed)
At goal.  

## 2023-05-30 ENCOUNTER — Encounter: Payer: Medicare Other | Admitting: Rehabilitative and Restorative Service Providers"

## 2023-06-01 ENCOUNTER — Encounter: Payer: Medicare Other | Admitting: Rehabilitative and Restorative Service Providers"

## 2023-06-01 DIAGNOSIS — H353211 Exudative age-related macular degeneration, right eye, with active choroidal neovascularization: Secondary | ICD-10-CM | POA: Diagnosis not present

## 2023-06-06 ENCOUNTER — Ambulatory Visit (HOSPITAL_BASED_OUTPATIENT_CLINIC_OR_DEPARTMENT_OTHER): Payer: Medicare Other | Admitting: Physical Therapy

## 2023-06-08 ENCOUNTER — Encounter: Payer: Medicare Other | Admitting: Rehabilitative and Restorative Service Providers"

## 2023-06-12 ENCOUNTER — Encounter: Payer: Medicare Other | Admitting: Rehabilitative and Restorative Service Providers"

## 2023-06-12 ENCOUNTER — Ambulatory Visit: Payer: Medicare Other | Admitting: Sports Medicine

## 2023-06-14 ENCOUNTER — Ambulatory Visit (HOSPITAL_BASED_OUTPATIENT_CLINIC_OR_DEPARTMENT_OTHER): Payer: Medicare Other | Admitting: Physical Therapy

## 2023-06-16 ENCOUNTER — Telehealth: Payer: Self-pay

## 2023-06-16 DIAGNOSIS — M1711 Unilateral primary osteoarthritis, right knee: Secondary | ICD-10-CM

## 2023-06-16 DIAGNOSIS — M19012 Primary osteoarthritis, left shoulder: Secondary | ICD-10-CM

## 2023-06-16 DIAGNOSIS — M159 Polyosteoarthritis, unspecified: Secondary | ICD-10-CM

## 2023-06-16 NOTE — Telephone Encounter (Signed)
Judeth Cornfield with [employer] [facility] # [phone] Fax# [phone]  Reqeusting to have celebrex clarified to either one or two capsules per day ( patient states she is taking two per day currently) She states that they cycle the meds for the patient and can not be  a range like one or two for this .  Requesting new order sent to pharmacy or call in new order to two per day

## 2023-06-16 NOTE — Telephone Encounter (Signed)
Attempted call to patient. Left a voice mail message requesting a return call.

## 2023-06-16 NOTE — Telephone Encounter (Signed)
Ok, I am concerned about her taking 2 a day chronically because it can affect the kidneys. Does she feel she can take 1 a day and use tylenol as needed?  If yes, then I can change the rx to say one a day.

## 2023-06-19 ENCOUNTER — Encounter: Payer: Medicare Other | Admitting: Rehabilitative and Restorative Service Providers"

## 2023-06-19 MED ORDER — CELECOXIB 200 MG PO CAPS
200.0000 mg | ORAL_CAPSULE | Freq: Every day | ORAL | 3 refills | Status: DC
Start: 2023-06-19 — End: 2023-11-09

## 2023-06-19 NOTE — Telephone Encounter (Signed)
Patient is agreeable to taking one per day  New script reflecting this change will need to be sent.

## 2023-06-19 NOTE — Addendum Note (Signed)
Addended by: Nani Gasser D on: 06/19/2023 04:39 PM   Modules accepted: Orders

## 2023-06-19 NOTE — Telephone Encounter (Signed)
Meds ordered this encounter  Medications   celecoxib (CELEBREX) 200 MG capsule    Sig: Take 1 capsule (200 mg total) by mouth daily.    Dispense:  90 capsule    Refill:  3   Not sure if we need to call [facility] back but I did go ahead and send a new prescription to them.

## 2023-06-21 ENCOUNTER — Ambulatory Visit (HOSPITAL_BASED_OUTPATIENT_CLINIC_OR_DEPARTMENT_OTHER): Payer: Medicare Other | Admitting: Physical Therapy

## 2023-06-21 NOTE — Telephone Encounter (Signed)
Judeth Cornfield with [facility] - informed.

## 2023-07-03 DIAGNOSIS — Z961 Presence of intraocular lens: Secondary | ICD-10-CM | POA: Diagnosis not present

## 2023-07-03 DIAGNOSIS — H43823 Vitreomacular adhesion, bilateral: Secondary | ICD-10-CM | POA: Diagnosis not present

## 2023-07-03 DIAGNOSIS — H353231 Exudative age-related macular degeneration, bilateral, with active choroidal neovascularization: Secondary | ICD-10-CM | POA: Diagnosis not present

## 2023-07-03 DIAGNOSIS — H35033 Hypertensive retinopathy, bilateral: Secondary | ICD-10-CM | POA: Diagnosis not present

## 2023-07-03 DIAGNOSIS — H40023 Open angle with borderline findings, high risk, bilateral: Secondary | ICD-10-CM | POA: Diagnosis not present

## 2023-07-05 ENCOUNTER — Ambulatory Visit (INDEPENDENT_AMBULATORY_CARE_PROVIDER_SITE_OTHER): Payer: Medicare Other | Admitting: Family Medicine

## 2023-07-05 ENCOUNTER — Encounter: Payer: Self-pay | Admitting: Family Medicine

## 2023-07-05 VITALS — BP 135/67 | HR 62 | Ht 62.0 in | Wt 182.0 lb

## 2023-07-05 DIAGNOSIS — R413 Other amnesia: Secondary | ICD-10-CM | POA: Diagnosis not present

## 2023-07-05 DIAGNOSIS — S50311A Abrasion of right elbow, initial encounter: Secondary | ICD-10-CM

## 2023-07-05 DIAGNOSIS — R4182 Altered mental status, unspecified: Secondary | ICD-10-CM

## 2023-07-05 DIAGNOSIS — K148 Other diseases of tongue: Secondary | ICD-10-CM

## 2023-07-05 NOTE — Assessment & Plan Note (Signed)
MOCA score of 25/30

## 2023-07-05 NOTE — Progress Notes (Signed)
Acute Office Visit  Subjective:     Patient ID: Anna [name], female    DOB: [birth year], 87 y.o.   MRN: [id number]  Chief Complaint  Patient presents with   Medical Management of Chronic Issues    Pt reports that she believes that she may have had a seizure 1.5wks ago.     HPI Patient is in today. ON 9/28 she woke up standing in the hall.  She says it really felt like a dream.  She went back in bedroom with assistance and noticed blood on her pillow.  Had noticed her left elbow was bruised. Doesn't remember what happened.  Hasn't had any other unusual sxs since then.   Please see prior note about episodes of dizziness and vertigo and memory changes.  WE did echo, carotid dopplers, MR and labs. All were unrevealing .   04/27/23: Echo  IMPRESSIONS    1. Left ventricular ejection fraction, by estimation, is 60 to 65%. The  left ventricle has normal function. The left ventricle has no regional  wall motion abnormalities. Left ventricular diastolic parameters are  consistent with Grade I diastolic  dysfunction (impaired relaxation). The average left ventricular global  longitudinal strain is -16.6 %. The global longitudinal strain is  abnormal.   2. Right ventricular systolic function is normal. The right ventricular  size is normal.   3. The mitral valve is normal in structure. No evidence of mitral valve  regurgitation. No evidence of mitral stenosis.   4. The aortic valve is normal in structure. Aortic valve regurgitation is  not visualized. No aortic stenosis is present.   5. The inferior vena cava is normal in size with greater than 50%  respiratory variability, suggesting right atrial pressure of 3 mmHg.   FINDINGS   Left Ventricle: Left ventricular ejection fraction, by estimation, is 60  to 65%. The left ventricle has normal function. The left ventricle has no  regional wall motion abnormalities. The average left ventricular global  longitudinal strain is -16.6 %.  The  global longitudinal strain is abnormal. The left ventricular internal  cavity size was normal in size. There is no left ventricular hypertrophy.  Left ventricular diastolic parameters are consistent with Grade I  diastolic dysfunction (impaired  relaxation).   Right Ventricle: The right ventricular size is normal. No increase in  right ventricular wall thickness. Right ventricular systolic function is  normal.   Left Atrium: Left atrial size was normal in size.   Right Atrium: Right atrial size was normal in size.   Pericardium: There is no evidence of pericardial effusion.   Mitral Valve: The mitral valve is normal in structure. Mild mitral annular  calcification. No evidence of mitral valve regurgitation. No evidence of  mitral valve stenosis.   Tricuspid Valve: The tricuspid valve is normal in structure. Tricuspid  valve regurgitation is not demonstrated. No evidence of tricuspid  stenosis.   Aortic Valve: The aortic valve is normal in structure. Aortic valve  regurgitation is not visualized. No aortic stenosis is present.   Pulmonic Valve: The pulmonic valve was normal in structure. Pulmonic valve  regurgitation is not visualized. No evidence of pulmonic stenosis.   Aorta: The aortic root is normal in size and structure.   Venous: The inferior vena cava is normal in size with greater than 50%  respiratory variability, suggesting right atrial pressure of 3 mmHg.   IAS/Shunts: No atrial level shunt detected by color flow Doppler.       ROS  Objective:    BP 135/67   Pulse 62   Ht 5\' 2"  (1.575 m)   Wt 182 lb (82.6 kg)   SpO2 94%   BMI 33.29 kg/m    Physical Exam Vitals and nursing note reviewed.  Constitutional:      Appearance: Normal appearance.  HENT:     Head: Normocephalic and atraumatic.  Eyes:     Conjunctiva/sclera: Conjunctivae normal.  Cardiovascular:     Rate and Rhythm: Normal rate and regular rhythm.  Pulmonary:     Effort: Pulmonary  effort is normal.     Breath sounds: Normal breath sounds.  Musculoskeletal:     Comments: She does have a couple scabs on her right elbow but they appear to be healing well.  Skin:    General: Skin is warm and dry.  Neurological:     Mental Status: She is alert.  Psychiatric:        Mood and Affect: Mood normal.     No results found for any visits on 07/05/23.      Assessment & Plan:   Problem List Items Addressed This Visit       Other   Memory change    MOCA score of 25/30        Relevant Orders   TSH   CBC with Differential/Platelet   CMP14+EGFR   Ambulatory referral to Neurology   Other Visit Diagnoses     Tongue biting    -  Primary   Relevant Orders   TSH   CBC with Differential/Platelet   CMP14+EGFR   Ambulatory referral to Neurology   Altered mental status, unspecified altered mental status type       Relevant Orders   TSH   CBC with Differential/Platelet   CMP14+EGFR   Ambulatory referral to Neurology   Abrasion of right elbow, initial encounter           She definitely some type of transient neurologic event-unclear etiology.  Consider sleep walking vs seizure vs TIA. Unclear. Will refer to neurology for further workup.  And prior to this she had had a few episodes of feeling lightheaded dizzy and some memory issues that seem to be occurring during the time she was getting some eye injections and had seem to resolve since I last saw her.  But now that she has had a somewhat different event I am concerned that she does need further workup.  Abrasion in healing well on right elbow. She says the left was bruised as well but that has resolved.   No orders of the defined types were placed in this encounter. Keep f/u in Feb.   No follow-ups on file.  Nani Gasser, MD

## 2023-07-06 LAB — CMP14+EGFR
ALT: 10 [IU]/L (ref 0–32)
AST: 29 [IU]/L (ref 0–40)
Albumin: 4.1 g/dL (ref 3.7–4.7)
Alkaline Phosphatase: 80 [IU]/L (ref 44–121)
BUN/Creatinine Ratio: 15 (ref 12–28)
BUN: 14 mg/dL (ref 8–27)
Bilirubin Total: 0.7 mg/dL (ref 0.0–1.2)
CO2: 21 mmol/L (ref 20–29)
Calcium: 8.9 mg/dL (ref 8.7–10.3)
Chloride: 104 mmol/L (ref 96–106)
Creatinine, Ser: 0.92 mg/dL (ref 0.57–1.00)
Globulin, Total: 1.8 g/dL (ref 1.5–4.5)
Glucose: 85 mg/dL (ref 70–99)
Potassium: 4.5 mmol/L (ref 3.5–5.2)
Sodium: 140 mmol/L (ref 134–144)
Total Protein: 5.9 g/dL — ABNORMAL LOW (ref 6.0–8.5)
eGFR: 61 mL/min/{1.73_m2} (ref >59)

## 2023-07-06 LAB — CBC WITH DIFFERENTIAL/PLATELET
Basophils Absolute: 0 10*3/uL (ref 0.0–0.2)
Basos: 1 %
EOS (ABSOLUTE): 0.4 10*3/uL (ref 0.0–0.4)
Eos: 7 %
Hematocrit: 43.9 % (ref 34.0–46.6)
Hemoglobin: 13.8 g/dL (ref 11.1–15.9)
Immature Grans (Abs): 0 10*3/uL (ref 0.0–0.1)
Immature Granulocytes: 1 %
Lymphocytes Absolute: 1.2 10*3/uL (ref 0.7–3.1)
Lymphs: 20 %
MCH: 29.1 pg (ref 26.6–33.0)
MCHC: 31.4 g/dL — ABNORMAL LOW (ref 31.5–35.7)
MCV: 93 fL (ref 79–97)
Monocytes Absolute: 0.7 10*3/uL (ref 0.1–0.9)
Monocytes: 12 %
Neutrophils Absolute: 3.6 10*3/uL (ref 1.4–7.0)
Neutrophils: 59 %
Platelets: 201 10*3/uL (ref 150–450)
RBC: 4.74 x10E6/uL (ref 3.77–5.28)
RDW: 13.4 % (ref 11.7–15.4)
WBC: 6 10*3/uL (ref 3.4–10.8)

## 2023-07-06 LAB — TSH: TSH: 1.23 u[IU]/mL (ref 0.450–4.500)

## 2023-07-06 NOTE — Progress Notes (Signed)
Call patient: Blood count looks great no worrisome findings.  Electrolytes are normal as well.  Protein level is just borderline low so make sure getting enough protein in the diet that can come from things like nuts, eggs, meat, dairy products, fish etc.  Liver function looks great.  Thyroid looks great as well.

## 2023-07-10 ENCOUNTER — Ambulatory Visit
Admission: EM | Admit: 2023-07-10 | Discharge: 2023-07-10 | Disposition: A | Discharge status: Home / Self Care | Payer: Medicare Other | Attending: Family Medicine | Admitting: Family Medicine

## 2023-07-10 DIAGNOSIS — H5789 Other specified disorders of eye and adnexa: Secondary | ICD-10-CM | POA: Diagnosis not present

## 2023-07-10 DIAGNOSIS — H109 Unspecified conjunctivitis: Secondary | ICD-10-CM | POA: Diagnosis not present

## 2023-07-10 MED ORDER — MOXIFLOXACIN HCL 0.5 % OP SOLN: 1.0000 [drp] | Freq: Three times a day (TID) | OPHTHALMIC | 0 refills | Status: AC

## 2023-07-10 NOTE — ED Provider Notes (Signed)
[facility]    CSN: [id number] Arrival date & time: 07/10/23  1221      History   Chief Complaint Chief Complaint  Patient presents with   Eye Problem    HPI Anna [name] is a 87 y.o. female.   HPI Very pleasant 87 year old female presents with right eye redness and itching since for 2-3 days.  Patient reports uses nightly eyedrops (Xalatan ophthalmic solution).  Reports nurse at her independent home suggested she be seen for pinkeye.  PMH significant for melanoma (s/p BCC), macular degeneration, glaucoma, and aortic atherosclerosis.  Past Medical History:  Diagnosis Date   Dermatitis    seborrehic scalp   Hiatal hernia    History of basal cell carcinoma 07/20/2017   On nose.    History of melanoma 07/20/2017   Right upper arm    Hyperlipidemia    Macular degeneration     Patient Active Problem List   Diagnosis Date Noted   Memory change 01/04/2023   Primary hypertension 02/07/2022   Anxious depression 02/07/2022   DDD (degenerative disc disease), cervical 05/04/2021   Neck pain 05/03/2021   Anorexia 06/30/2020   Umbilical hernia without obstruction and without gangrene 06/30/2020   Meralgia paresthetica of right side 06/22/2020   Venous insufficiency of both lower extremities 06/22/2020   Primary osteoarthritis, left shoulder 06/03/2020   Bilateral lower extremity edema 05/25/2020   Aortic atherosclerosis (HCC) 05/13/2020   Bunion, right foot 11/22/2019   Lichen planus 11/22/2019   Asthma in adult 11/22/2019   Mixed restrictive and obstructive lung disease (HCC) 11/22/2019   Primary osteoarthritis of both knees 11/22/2019   Acute pain of left shoulder 09/05/2019   Chronic bilateral low back pain without sciatica 09/05/2019   OAB (overactive bladder) 08/08/2019   Insomnia 08/08/2019   History of melanoma 07/20/2017   History of basal cell carcinoma 07/20/2017   IFG (impaired fasting glucose) 07/17/2017   Macular degeneration    Chronic  fatigue 08/06/2015   Gastroesophageal reflux disease without esophagitis 08/06/2015   Glaucoma 08/06/2015   Hiatal hernia 08/06/2015   Melanoma of right upper arm (HCC) 08/06/2015   Mixed hyperlipidemia 08/06/2015   Rupture of right biceps tendon 08/06/2015   Biceps tendonitis of both shoulders 08/06/2015    Past Surgical History:  Procedure Laterality Date   BREAST CYST EXCISION Right 09/1962   CATARACT EXTRACTION  03/2015   melonoma Right 06/2016   arm   MOHS SURGERY Left 06/2016   nose    OB History   No obstetric history on file.      Home Medications    Prior to Admission medications   Medication Sig Start Date End Date Taking? Authorizing Provider  moxifloxacin (VIGAMOX) 0.5 % ophthalmic solution Place 1 drop into the right eye 3 (three) times daily for 5 days. 07/10/23 07/15/23 Yes Trevor Iha, FNP  ammonium lactate (AMLACTIN) 12 % cream Apply topically as needed for dry skin.    [provider]  Apoaequorin (PREVAGEN) 10 MG CAPS Take by mouth.    [provider]  calcium citrate (CALCITRATE - DOSED IN MG ELEMENTAL CALCIUM) 950 (200 Ca) MG tablet Take by mouth.    [provider]  calcium citrate-vitamin D (CITRACAL+D) 315-200 MG-UNIT tablet Take 1 tablet by mouth 2 (two) times daily.    [provider]  celecoxib (CELEBREX) 200 MG capsule Take 1 capsule (200 mg total) by mouth daily. 06/19/23   Agapito Games, MD  Cholecalciferol (VITAMIN D-1000 MAX ST)  25 MCG (1000 UT) tablet Take by mouth.    [provider]  escitalopram (LEXAPRO) 5 MG tablet TAKE ONE TABLET BY MOUTH IN THE MORNING 08/09/22   Agapito Games, MD  Flaxseed Oil OIL 1,000 mg.    [provider]  fluticasone-salmeterol (ADVAIR) 250-50 MCG/ACT AEPB Inhale 1 puff into the lungs 2 (two) times daily. 06/21/22   Agapito Games, MD  latanoprost (XALATAN) 0.005 % ophthalmic solution Place 1 drop into both eyes daily. 05/02/16   [provider]  lovastatin (MEVACOR) 40 MG tablet TAKE ONE TABLET BY MOUTH NIGHTLY/ labs for further refills 07/11/22   Agapito Games, MD  meclizine (ANTIVERT) 25 MG tablet Take 1 tablet (25 mg total) by mouth 3 (three) times daily as needed for dizziness. 08/21/19   Agapito Games, MD  Multiple Vitamins-Minerals (PRESERVISION AREDS 2) CAPS Take by mouth.    [provider]  olmesartan (BENICAR) 20 MG tablet Take 1 tablet (20 mg total) by mouth daily. 06/07/22   Agapito Games, MD  omeprazole (PRILOSEC) 40 MG capsule Take 1 capsule (40 mg total) by mouth daily. 01/17/22   Agapito Games, MD  timolol (BETIMOL) 0.5 % ophthalmic solution Place 1 drop into the left eye 2 (two) times daily.    [provider]  triamcinolone cream (KENALOG) 0.1 % Apply 1 Application topically 2 (two) times daily as needed. Do not use for more than 2 weeks at a time 10/25/22   Agapito Games, MD  TURMERIC PO Take 1,000 mg by mouth.    [provider]  vitamin B-12 (CYANOCOBALAMIN) 1000 MCG tablet Take 1,000 mcg by mouth daily.     [provider]    Family History Family History  Problem Relation Age of Onset   Cancer Mother        pancreatic   Cancer Father        liver   Hyperlipidemia Sister    Macular degeneration Sister    Atrial fibrillation Maternal Aunt    Cancer - Prostate Brother    Breast cancer Neg Hx     Social History Social History   Tobacco Use   Smoking status: Never   Smokeless tobacco: Never  Vaping Use   Vaping status: Never Used  Substance Use Topics   Alcohol use: Not Currently    Alcohol/week: 1.0 standard drink of alcohol    Types: 1 Glasses of wine per week    Comment: occasionally   Drug use: No     Allergies   Aleve [naproxen sodium], Lisinopril, and Azithromycin   Review of Systems Review of Systems  Eyes:  Positive for redness and itching.  All other systems reviewed and are  negative.    Physical Exam Triage Vital Signs ED Triage Vitals [07/10/23 1229]  Encounter Vitals Group     BP (!) 146/89     Systolic BP Percentile      Diastolic BP Percentile      Pulse Rate 79     Resp 18     Temp (!) 97.5 F (36.4 C)     Temp Source Oral     SpO2 95 %     Weight      Height      Head Circumference      Peak Flow      Pain Score 0     Pain Loc      Pain Education      Exclude  from Growth Chart    No data found.  Updated Vital Signs BP (!) 146/89 (BP Location: Left Arm)   Pulse 79   Temp (!) 97.5 F (36.4 C) (Oral)   Resp 18   SpO2 95%    Physical Exam Vitals and nursing note reviewed.  Constitutional:      Appearance: Normal appearance. She is normal weight.  HENT:     Head: Normocephalic and atraumatic.     Mouth/Throat:     Mouth: Mucous membranes are moist.     Pharynx: Oropharynx is clear.  Eyes:     Extraocular Movements: Extraocular movements intact.     Conjunctiva/sclera: Conjunctivae normal.     Pupils: Pupils are equal, round, and reactive to light.     Comments: Right eye: Sclera with +2 injection  Cardiovascular:     Rate and Rhythm: Normal rate and regular rhythm.     Pulses: Normal pulses.     Heart sounds: Normal heart sounds.  Pulmonary:     Effort: Pulmonary effort is normal.     Breath sounds: Normal breath sounds. No wheezing, rhonchi or rales.  Musculoskeletal:     Cervical back: Normal range of motion and neck supple.  Skin:    General: Skin is warm and dry.  Neurological:     General: No focal deficit present.     Mental Status: She is alert and oriented to person, place, and time. Mental status is at baseline.      UC Treatments / Results  Labs (all labs ordered are listed, but only abnormal results are displayed) Labs Reviewed - No data to display  EKG   Radiology No results found.  Procedures Procedures (including critical care time)  Medications Ordered in UC Medications - No data to  display  Initial Impression / Assessment and Plan / UC Course  I have reviewed the triage vital signs and the nursing notes.  Pertinent labs & imaging results that were available during my care of the patient were reviewed by me and considered in my medical decision making (see chart for details).     MDM: 1.  Conjunctivitis of right eye, unspecified conjunctivitis type-Rx'd Vigamox 0.5% ophthalmic solution: Place 1 drop into right eye 3 times daily for 5 days; 2.  Irritation of right eye-Rx'd Vigamox 0.5% ophthalmic solution: Place 1 drop into right eye 3 times daily for 5 days. Advised patient to place eyedrops as directed.  Advised if right eye redness/itching worsens please follow-up with your ophthalmologist for further evaluation.  Patient discharged home, hemodynamically stable. Final Clinical Impressions(s) / UC Diagnoses   Final diagnoses:  Irritation of right eye  Conjunctivitis of right eye, unspecified conjunctivitis type     Discharge Instructions      Advised patient to place eyedrops as directed.  Advised if right eye redness/itching worsens please follow-up with your ophthalmologist for further evaluation.     ED Prescriptions     Medication Sig Dispense Auth. Provider   moxifloxacin (VIGAMOX) 0.5 % ophthalmic solution Place 1 drop into the right eye 3 (three) times daily for 5 days. 3 mL Trevor Iha, FNP      PDMP not reviewed this encounter.   Trevor Iha, FNP 07/10/23 1320

## 2023-07-10 NOTE — ED Triage Notes (Signed)
Pt c/o rt eye redness and itching since Saturday. States uses nighty eye drops. States her nurse at the independent home suggested to be seen for pink eye.

## 2023-07-10 NOTE — Discharge Instructions (Addendum)
Advised patient to place eyedrops as directed.  Advised if right eye redness/itching worsens please follow-up with your ophthalmologist for further evaluation.

## 2023-07-13 ENCOUNTER — Telehealth: Payer: Self-pay | Admitting: Family Medicine

## 2023-07-13 NOTE — Telephone Encounter (Signed)
Patient is unable to get an appointment with Neurology in [place] until 12/28/23. Patient would like to know if Dr. Linford Arnold would like to refer her to another office. Please advise.

## 2023-07-20 DIAGNOSIS — H353231 Exudative age-related macular degeneration, bilateral, with active choroidal neovascularization: Secondary | ICD-10-CM | POA: Diagnosis not present

## 2023-07-20 NOTE — Telephone Encounter (Signed)
Hi Tosha, I know we had referred her to neurology but they cannot see her until April at [place].  Could you see if [facility] in [facility] or [place] [facility] could see her sooner, and get her scheduled?

## 2023-07-20 NOTE — Telephone Encounter (Signed)
Referral and clinical notes have been faxed to [facility]  through Epic. Office will contact patient to schedule referral appointment.

## 2023-08-29 ENCOUNTER — Ambulatory Visit: Payer: Medicare Other | Admitting: Family Medicine

## 2023-08-30 ENCOUNTER — Encounter: Payer: Self-pay | Admitting: Physician Assistant

## 2023-08-30 ENCOUNTER — Ambulatory Visit (INDEPENDENT_AMBULATORY_CARE_PROVIDER_SITE_OTHER): Payer: Medicare Other | Admitting: Physician Assistant

## 2023-08-30 VITALS — BP 127/82 | HR 90 | Ht 62.0 in | Wt 178.0 lb

## 2023-08-30 DIAGNOSIS — R319 Hematuria, unspecified: Secondary | ICD-10-CM

## 2023-08-30 DIAGNOSIS — K529 Noninfective gastroenteritis and colitis, unspecified: Secondary | ICD-10-CM | POA: Diagnosis not present

## 2023-08-30 DIAGNOSIS — R531 Weakness: Secondary | ICD-10-CM | POA: Diagnosis not present

## 2023-08-30 LAB — POCT URINALYSIS DIP (CLINITEK)
Bilirubin, UA: NEGATIVE
Glucose, UA: NEGATIVE mg/dL
Ketones, POC UA: NEGATIVE mg/dL
Leukocytes, UA: NEGATIVE
Nitrite, UA: NEGATIVE
POC PROTEIN,UA: NEGATIVE
Spec Grav, UA: <=1.005 — AB (ref 1.010–1.025)
Urobilinogen, UA: 0.2 U/dL
pH, UA: 5.5 (ref 5.0–8.0)

## 2023-08-30 NOTE — Patient Instructions (Addendum)
Liquid IV once a day for 3 days.  Drink at least 60oz of water a day  Keep a bland diet  Food Choices to Help Relieve Diarrhea, Adult Diarrhea can make you feel weak and cause you to become dehydrated. Dehydration is a condition in which there is not enough water or other fluids in the body. It is important to choose the right foods and drinks to: Relieve diarrhea. Replace lost fluids and nutrients. Prevent dehydration. What are tips for following this plan? Relieving diarrhea Avoid foods that make your diarrhea worse. These may include: Foods and drinks that are sweetened with high-fructose corn syrup, honey, or sweeteners such as xylitol, sorbitol, and mannitol. Check food labels for these ingredients. Fried, greasy, or spicy foods. Raw fruits and vegetables. Eat foods that are rich in probiotics. These include foods such as yogurt and fermented milk products. Probiotics can help increase healthy bacteria in your stomach and intestines (gastrointestinal or GI tract). This may help digestion and stop diarrhea. If you have lactose intolerance, avoid dairy products. These may make your diarrhea worse. Take medicine to help stop diarrhea only as told by your health care provider. Replacing nutrients  Eat bland, easy-to-digest foods in small amounts as you are able, until your diarrhea starts to get better. These foods include bananas, applesauce, rice, toast, and crackers. Over time, add nutrient-rich foods as your body tolerates them or as told by your health care provider. These include: Well-cooked protein foods, such as eggs, lean meats like fish or chicken without skin, and tofu. Peeled, seeded, and soft-cooked fruits and vegetables. Low-fat dairy products. Whole grains. Take vitamin and mineral supplements as told by your health care provider. Preventing dehydration  Start by sipping water or a solution to prevent dehydration (oral rehydration solution, or ORS). This is a drink that  helps replace fluids and minerals your body has lost. You can buy an ORS at pharmacies and retail stores. Try to drink at least 8-10 cups (2,000-2,500 mL) of fluid each day to help replace lost fluids. If your urine is pale yellow, you are getting enough fluids. You may drink other liquids in addition to water, such as fruit juice that you have added water to (diluted fruit juice) or low-calorie sports drinks, as tolerated or as told by your health care provider. Avoid drinks with caffeine, such as coffee, tea, or soft drinks. Avoid alcohol. This information is not intended to replace advice given to you by your health care provider. Make sure you discuss any questions you have with your health care provider. Document Revised: 03/01/2022 Document Reviewed: 03/01/2022 Elsevier Patient Education  2024 [employer] Inc.  Viral Gastroenteritis, Adult  Viral gastroenteritis is also known as the stomach flu. This condition may affect your stomach, your small intestine, and your large intestine. It can cause sudden watery poop (diarrhea), fever, and vomiting. This condition is caused by certain germs (viruses). These germs can be passed from person to person very easily (are contagious). Having watery poop and vomiting can make you feel weak and cause you to not have enough water in your body (get dehydrated). This can make you tired and thirsty, make you have a dry mouth, and make it so you pee (urinate) less often. It is important to replace the fluids that you lose from having watery poop and vomiting. What are the causes? You can get sick by catching germs from other people. You can also get sick by: Eating food, drinking water, or touching a surface that  has the germs on it (is contaminated). Sharing utensils or other personal items with a person who is sick. What increases the risk? Having a weak body defense system (immune system). Living with one or more children who are younger than 2  years. Living in a nursing home. Going on cruise ships. What are the signs or symptoms? Symptoms of this condition start suddenly. Symptoms may last for a few days or for as long as a week. Common symptoms include: Watery poop. Vomiting. Other symptoms include: Fever. Headache. Feeling tired (fatigue). Pain in the belly (abdomen). Chills. Feeling weak. Feeling like you may vomit (nauseous). Muscle aches. Not feeling hungry. How is this treated? This condition typically goes away on its own. The focus of treatment is to replace the fluids that you lose. This condition may be treated with: An ORS (oral rehydration solution). This is a drink that helps you replace fluids and minerals your body lost. It is sold at pharmacies and stores. Medicines to help with your symptoms. Probiotic supplements to reduce symptoms of watery poop. Fluids given through an IV tube, if needed. Older adults and people with other diseases or a weak body defense system are at higher risk for not having enough water in the body. Follow these instructions at home: Eating and drinking  Take an ORS as told by your doctor. Drink clear fluids in small amounts as you are able. Clear fluids include: Water. Ice chips. Fruit juice that has water added to it (is diluted). Low-calorie sports drinks. Drink enough fluid to keep your pee (urine) pale yellow. Eat small amounts of healthy foods every 3-4 hours as you are able. This may include whole grains, fruits, vegetables, lean meats, and yogurt. Avoid fluids that have a lot of sugar or caffeine in them. This includes energy drinks, sports drinks, and soda. Avoid spicy or fatty foods. Avoid alcohol. General instructions  Wash your hands often. This is very important after you have watery poop or you vomit. If you cannot use soap and water, use hand sanitizer. Make sure that all people in your home wash their hands well and often. Take over-the-counter and  prescription medicines only as told by your doctor. Rest at home while you get better. Watch your condition for any changes. Take a warm bath to help with any burning or pain from having watery poop. Keep all follow-up visits. Contact a doctor if: You cannot keep fluids down. Your symptoms get worse. You have new symptoms. You feel light-headed or dizzy. You have muscle cramps. Get help right away if: You have chest pain. You have trouble breathing, or you are breathing very fast. You have a fast heartbeat. You feel very weak or you faint. You have a very bad headache, a stiff neck, or both. You have a rash. You have very bad pain, cramping, or bloating in your belly. Your skin feels cold and clammy. You feel mixed up (confused). You have pain when you pee. You have signs of not having enough water in the body, such as: Dark pee, hardly any pee, or no pee. Cracked lips. Dry mouth. Sunken eyes. Feeling very sleepy. Feeling weak. You have signs of bleeding, such as: You see blood in your vomit. Your vomit looks like coffee grounds. You have bloody or black poop or poop that looks like tar. These symptoms may be an emergency. Get help right away. Call 911. Do not wait to see if the symptoms will go away. Do not drive yourself to  the hospital. Summary Viral gastroenteritis is also known as the stomach flu. This condition can cause sudden watery poop (diarrhea), fever, and vomiting. These germs can be passed from person to person very easily. Take an ORS (oral rehydration solution) as told by your doctor. This is a drink that is sold at pharmacies and stores. Wash your hands often, especially after having watery poop or vomiting. If you cannot use soap and water, use hand sanitizer. This information is not intended to replace advice given to you by your health care provider. Make sure you discuss any questions you have with your health care provider. Document Revised: 07/12/2021  Document Reviewed: 07/12/2021 Elsevier Patient Education  2024 [employer].

## 2023-08-30 NOTE — Progress Notes (Signed)
Acute Office Visit  Subjective:     Patient ID: Anna [name], female    DOB: [birth year], 87 y.o.   MRN: [id number]  Chief Complaint  Patient presents with   Diarrhea    Pt c/o ongoing diarrhea and vomiting onset for 1 week with diarrhea episode upwards 3 times a day.    Diarrhea  Pertinent negatives include no abdominal pain or vomiting.   Patient is in today for persistent fatigue and weakness after recent diarrhea starting 1 wk ago that has since resolved 2 days ago. She experienced diarrhea 3x/day. She had loss of appetite. She lives in a nursing facility. Denies change in diet. She took Imodium once but her CNA told her to stop. She has had one solid stool yesterday.   Review of Systems  Constitutional:  Positive for malaise/fatigue.  Gastrointestinal:  Positive for diarrhea. Negative for abdominal pain, blood in stool, constipation, nausea and vomiting.  Genitourinary:  Negative for dysuria, frequency and urgency.  Neurological:  Positive for weakness.      Objective:    BP 127/82   Pulse 90   Ht 5\' 2"  (1.575 m)   Wt 178 lb 0.6 oz (80.8 kg)   SpO2 99%   BMI 32.56 kg/m  BP Readings from Last 3 Encounters:  08/30/23 127/82  07/10/23 (!) 146/89  07/05/23 135/67   Wt Readings from Last 3 Encounters:  08/30/23 178 lb 0.6 oz (80.8 kg)  07/05/23 182 lb (82.6 kg)  05/22/23 180 lb (81.6 kg)    .Marland Kitchen Results for orders placed or performed in visit on 08/30/23  POCT URINALYSIS DIP (CLINITEK)  Result Value Ref Range   Color, UA yellow yellow   Clarity, UA clear clear   Glucose, UA negative negative mg/dL   Bilirubin, UA negative negative   Ketones, POC UA negative negative mg/dL   Spec Grav, UA <=4.098 (A) 1.010 - 1.025   Blood, UA small (A) negative   pH, UA 5.5 5.0 - 8.0   POC PROTEIN,UA negative negative, trace   Urobilinogen, UA 0.2 0.2 or 1.0 E.U./dL   Nitrite, UA Negative Negative   Leukocytes, UA Negative Negative     Physical Exam Constitutional:       Appearance: Normal appearance. She is obese.  HENT:     Head: Normocephalic.     Mouth/Throat:     Mouth: Mucous membranes are dry.  Cardiovascular:     Rate and Rhythm: Normal rate and regular rhythm.     Heart sounds: Normal heart sounds.  Pulmonary:     Effort: Pulmonary effort is normal.     Breath sounds: Normal breath sounds.  Abdominal:     General: Bowel sounds are normal. There is no distension.     Palpations: Abdomen is soft. There is no mass.     Tenderness: There is no abdominal tenderness. There is no right CVA tenderness, left CVA tenderness, guarding or rebound.     Hernia: No hernia is present.  Musculoskeletal:     Right lower leg: No edema.     Left lower leg: No edema.  Neurological:     General: No focal deficit present.     Mental Status: She is alert and oriented to person, place, and time.  Psychiatric:        Mood and Affect: Mood normal.         Assessment & Plan:  Marland KitchenMarland KitchenVolanda was seen today for diarrhea.  Diagnoses and all orders for this visit:  Gastroenteritis -     CBC w/Diff/Platelet -     CMP14+EGFR -     Lipase -     POCT URINALYSIS DIP (CLINITEK) -     Urine Culture  Weak -     CBC w/Diff/Platelet -     CMP14+EGFR -     Lipase  Hematuria, unspecified type   Per patient diarrhea has resolved and she has had one solid stool Likely viral gastroenteritis Advised patient to hydrate with 60 oz of H2O/day and eat more. Recommended liquid IV or sugar-free Gatorade 1-2/day for the next 3 days to increase electrolyte intake. Ordered UA due to increased risk of UTI with loose stools. Mild hematuria in UA- sent for culture. HO on diet for diarrhea.  2 wks f/u with PCP- if no bacteria grows on urine culture, recheck UA at this visit.   Tandy Gaw, PA-C

## 2023-08-31 LAB — CMP14+EGFR
ALT: 43 [IU]/L — ABNORMAL HIGH (ref 0–32)
AST: 58 [IU]/L — ABNORMAL HIGH (ref 0–40)
Albumin: 3.4 g/dL — ABNORMAL LOW (ref 3.7–4.7)
Alkaline Phosphatase: 266 [IU]/L — ABNORMAL HIGH (ref 44–121)
BUN/Creatinine Ratio: 19 (ref 12–28)
BUN: 44 mg/dL — ABNORMAL HIGH (ref 8–27)
Bilirubin Total: 0.8 mg/dL (ref 0.0–1.2)
CO2: 21 mmol/L (ref 20–29)
Calcium: 8.3 mg/dL — ABNORMAL LOW (ref 8.7–10.3)
Chloride: 96 mmol/L (ref 96–106)
Creatinine, Ser: 2.33 mg/dL — ABNORMAL HIGH (ref 0.57–1.00)
Globulin, Total: 2.2 g/dL (ref 1.5–4.5)
Glucose: 85 mg/dL (ref 70–99)
Potassium: 4 mmol/L (ref 3.5–5.2)
Sodium: 135 mmol/L (ref 134–144)
Total Protein: 5.6 g/dL — ABNORMAL LOW (ref 6.0–8.5)
eGFR: 20 mL/min/{1.73_m2} — ABNORMAL LOW (ref >59)

## 2023-08-31 LAB — CBC WITH DIFFERENTIAL/PLATELET
Basophils Absolute: 0 10*3/uL (ref 0.0–0.2)
Basos: 0 %
EOS (ABSOLUTE): 0.4 10*3/uL (ref 0.0–0.4)
Eos: 3 %
Hematocrit: 40 % (ref 34.0–46.6)
Hemoglobin: 13.4 g/dL (ref 11.1–15.9)
Immature Grans (Abs): 0.1 10*3/uL (ref 0.0–0.1)
Immature Granulocytes: 1 %
Lymphocytes Absolute: 0.5 10*3/uL — ABNORMAL LOW (ref 0.7–3.1)
Lymphs: 3 %
MCH: 29 pg (ref 26.6–33.0)
MCHC: 33.5 g/dL (ref 31.5–35.7)
MCV: 87 fL (ref 79–97)
Monocytes Absolute: 0.6 10*3/uL (ref 0.1–0.9)
Monocytes: 4 %
Neutrophils Absolute: 14.3 10*3/uL — ABNORMAL HIGH (ref 1.4–7.0)
Neutrophils: 89 %
Platelets: 236 10*3/uL (ref 150–450)
RBC: 4.62 x10E6/uL (ref 3.77–5.28)
RDW: 12.7 % (ref 11.7–15.4)
WBC: 16 10*3/uL — ABNORMAL HIGH (ref 3.4–10.8)

## 2023-08-31 LAB — LIPASE: Lipase: 71 U/L (ref 14–85)

## 2023-09-01 ENCOUNTER — Other Ambulatory Visit: Payer: Self-pay | Admitting: Family Medicine

## 2023-09-01 ENCOUNTER — Other Ambulatory Visit: Payer: Self-pay | Admitting: Physician Assistant

## 2023-09-01 ENCOUNTER — Ambulatory Visit (INDEPENDENT_AMBULATORY_CARE_PROVIDER_SITE_OTHER): Payer: Medicare Other | Admitting: Family Medicine

## 2023-09-01 ENCOUNTER — Encounter: Payer: Self-pay | Admitting: Family Medicine

## 2023-09-01 VITALS — BP 136/87 | HR 75 | Temp 98.7°F | Ht 62.0 in | Wt 179.0 lb

## 2023-09-01 DIAGNOSIS — N179 Acute kidney failure, unspecified: Secondary | ICD-10-CM

## 2023-09-01 DIAGNOSIS — E860 Dehydration: Secondary | ICD-10-CM

## 2023-09-01 DIAGNOSIS — D72829 Elevated white blood cell count, unspecified: Secondary | ICD-10-CM

## 2023-09-01 DIAGNOSIS — R748 Abnormal levels of other serum enzymes: Secondary | ICD-10-CM | POA: Diagnosis not present

## 2023-09-01 LAB — CMP14+EGFR
ALT: 21 [IU]/L (ref 0–32)
AST: 28 [IU]/L (ref 0–40)
Albumin: 3 g/dL — ABNORMAL LOW (ref 3.7–4.7)
Alkaline Phosphatase: 180 [IU]/L — ABNORMAL HIGH (ref 44–121)
BUN/Creatinine Ratio: 18 (ref 12–28)
BUN: 17 mg/dL (ref 8–27)
Bilirubin Total: 0.8 mg/dL (ref 0.0–1.2)
CO2: 24 mmol/L (ref 20–29)
Calcium: 7.4 mg/dL — ABNORMAL LOW (ref 8.7–10.3)
Chloride: 99 mmol/L (ref 96–106)
Creatinine, Ser: 0.97 mg/dL (ref 0.57–1.00)
Globulin, Total: 2 g/dL (ref 1.5–4.5)
Glucose: 89 mg/dL (ref 70–99)
Potassium: 3.9 mmol/L (ref 3.5–5.2)
Sodium: 133 mmol/L — ABNORMAL LOW (ref 134–144)
Total Protein: 5 g/dL — ABNORMAL LOW (ref 6.0–8.5)
eGFR: 57 mL/min/{1.73_m2} — ABNORMAL LOW (ref >59)

## 2023-09-01 LAB — CBC WITH DIFFERENTIAL/PLATELET
Basophils Absolute: 0 10*3/uL (ref 0.0–0.2)
Basos: 0 %
EOS (ABSOLUTE): 0.2 10*3/uL (ref 0.0–0.4)
Eos: 2 %
Hematocrit: 35.9 % (ref 34.0–46.6)
Hemoglobin: 11.8 g/dL (ref 11.1–15.9)
Immature Granulocytes: 1 %
Lymphocytes Absolute: 1.1 10*3/uL (ref 0.7–3.1)
Lymphs: 10 %
MCH: 28.4 pg (ref 26.6–33.0)
MCHC: 32.9 g/dL (ref 31.5–35.7)
MCV: 86 fL (ref 79–97)
Monocytes Absolute: 1 10*3/uL — ABNORMAL HIGH (ref 0.1–0.9)
Monocytes: 9 %
Neutrophils Absolute: 8.6 10*3/uL — ABNORMAL HIGH (ref 1.4–7.0)
Neutrophils: 78 %
Platelets: 209 10*3/uL (ref 150–450)
RBC: 4.16 x10E6/uL (ref 3.77–5.28)
RDW: 13.6 % (ref 11.7–15.4)
WBC: 11.2 10*3/uL — ABNORMAL HIGH (ref 3.4–10.8)

## 2023-09-01 LAB — URINE CULTURE

## 2023-09-01 NOTE — Progress Notes (Signed)
We need to get patient back into clinic today for repeat blood work. Her kidney function has dropped quite a bit and could be just dehydration but we need to make sure bouncing back. How does she feel today?   No significant bacteria found in urine culture.   Repeat CBC and CMP STAT. If not improving she will need to go to ER for fluids.

## 2023-09-01 NOTE — Progress Notes (Unsigned)
Acute Office Visit  Subjective:     Patient ID: Anna [name], female    DOB: 07/22/1936, 87 y.o.   MRN: [id number]  Chief Complaint  Patient presents with   Medical Management of Chronic Issues    HPI Patient is in today for she feels like she is finally getting over 2 weeks of vomiting and diarrhea she says it stopped yesterday and she was able to get some fluids then.  She is here today because she had labs drawn on the fourth and her renal function went from 0.9-2.33 with a BUN of 44.  Her AST ALT and alkaline phosphatase were also elevated.  She says she just feels extremely weak.  Vitals are stable.  She really pushed fluids yesterday and she is here for recheck.    ROS      Objective:    BP 136/87   Pulse 75   Temp 98.7 F (37.1 C)   Ht 5\' 2"  (1.575 m)   Wt 179 lb (81.2 kg)   SpO2 94%   BMI 32.74 kg/m    Physical Exam Vitals reviewed.  Constitutional:      Appearance: Normal appearance.  HENT:     Head: Normocephalic.  Pulmonary:     Effort: Pulmonary effort is normal.  Skin:    Coloration: Skin is jaundiced.  Neurological:     Mental Status: She is alert and oriented to person, place, and time.  Psychiatric:        Mood and Affect: Mood normal.        Behavior: Behavior normal.     Results for orders placed or performed in visit on 09/01/23  CBC with Differential/Platelet  Result Value Ref Range   WBC 11.2 (H) 3.4 - 10.8 x10E3/uL   RBC 4.16 3.77 - 5.28 x10E6/uL   Hemoglobin 11.8 11.1 - 15.9 g/dL   Hematocrit 14.7 82.9 - 46.6 %   MCV 86 79 - 97 fL   MCH 28.4 26.6 - 33.0 pg   MCHC 32.9 31.5 - 35.7 g/dL   RDW 56.2 13.0 - 86.5 %   Platelets 209 150 - 450 x10E3/uL   Neutrophils 78 Not Estab. %   Lymphs 10 Not Estab. %   Monocytes 9 Not Estab. %   Eos 2 Not Estab. %   Basos 0 Not Estab. %   Neutrophils Absolute 8.6 (H) 1.4 - 7.0 x10E3/uL   Lymphocytes Absolute 1.1 0.7 - 3.1 x10E3/uL   Monocytes Absolute 1.0 (H) 0.1 - 0.9 x10E3/uL   EOS  (ABSOLUTE) 0.2 0.0 - 0.4 x10E3/uL   Basophils Absolute 0.0 0.0 - 0.2 x10E3/uL   Immature Granulocytes 1 Not Estab. %  CMP14+EGFR  Result Value Ref Range   Glucose 89 70 - 99 mg/dL   BUN 17 8 - 27 mg/dL   Creatinine, Ser 7.84 0.57 - 1.00 mg/dL   eGFR 57 (L) >69 GE/XBM/8.41   BUN/Creatinine Ratio 18 12 - 28   Sodium 133 (L) 134 - 144 mmol/L   Potassium 3.9 3.5 - 5.2 mmol/L   Chloride 99 96 - 106 mmol/L   CO2 24 20 - 29 mmol/L   Calcium 7.4 (L) 8.7 - 10.3 mg/dL   Total Protein 5.0 (L) 6.0 - 8.5 g/dL   Albumin 3.0 (L) 3.7 - 4.7 g/dL   Globulin, Total 2.0 1.5 - 4.5 g/dL   Bilirubin Total 0.8 0.0 - 1.2 mg/dL   Alkaline Phosphatase 180 (H) 44 - 121 IU/L   AST 28 0 -  40 IU/L   ALT 21 0 - 32 IU/L        Assessment & Plan:   Problem List Items Addressed This Visit   None Visit Diagnoses     AKI (acute kidney injury) (HCC)    -  Primary   Relevant Orders   CBC with Differential/Platelet (Completed)   CMP14+EGFR (Completed)   Hepatitis, Acute   Elevated liver enzymes       Relevant Orders   CBC with Differential/Platelet (Completed)   CMP14+EGFR (Completed)   Hepatitis, Acute   Dehydration       Relevant Orders   CBC with Differential/Platelet (Completed)   CMP14+EGFR (Completed)   Hepatitis, Acute       AKI with hydration. - Given IV fluids today.  She felt better after bolus of 1 bag of NS.  Recheck renal function and liver function.encouraged to continue to push fluids and eat as tolerated.    IV placed by NP Christen Butter.  Patient tolerated well. Vitals stable before and after infusion.     She looks like has some mild jaundice today so check liver.    No orders of the defined types were placed in this encounter.   No follow-ups on file.  Nani Gasser, MD

## 2023-09-02 LAB — HCV INTERPRETATION

## 2023-09-02 LAB — HAV, HBV, HCV
HCV Ab: NONREACTIVE
Hep B Core Total Ab: NEGATIVE
Hep B Surface Ab, Qual: NONREACTIVE
Hepatitis B Surface Ag: NEGATIVE
hep A Total Ab: NEGATIVE

## 2023-09-04 ENCOUNTER — Encounter: Payer: Self-pay | Admitting: Student

## 2023-09-04 ENCOUNTER — Ambulatory Visit: Payer: Medicare Other | Admitting: Sports Medicine

## 2023-09-04 NOTE — Progress Notes (Signed)
Route to PCP-labs looking much better.

## 2023-09-04 NOTE — Progress Notes (Signed)
Call patient: Anna [name] news!  Your white blood cells were 16 but they are back down to 11.2 which is great.  The liver enzymes are back to normal which is great.  And the kidney function was 2.3 is now back to normal at 0.9 which is absolutely fantastic.

## 2023-09-05 ENCOUNTER — Encounter: Payer: Self-pay | Admitting: Family Medicine

## 2023-09-05 ENCOUNTER — Ambulatory Visit (INDEPENDENT_AMBULATORY_CARE_PROVIDER_SITE_OTHER): Payer: Medicare Other | Admitting: Family Medicine

## 2023-09-05 VITALS — Ht 62.0 in | Wt 179.0 lb

## 2023-09-05 DIAGNOSIS — Z0000 Encounter for general adult medical examination without abnormal findings: Secondary | ICD-10-CM | POA: Diagnosis not present

## 2023-09-05 NOTE — Progress Notes (Signed)
Subjective:   Anna [name] is a 87 y.o. female who presents for Medicare Annual (Subsequent) preventive examination.  Visit Complete: Virtual I connected with  Anna [name] on 09/05/23 by a audio enabled telemedicine application and verified that I am speaking with the correct person using two identifiers.  Patient Location: Home  Provider Location: Office/Clinic  I discussed the limitations of evaluation and management by telemedicine. The patient expressed understanding and agreed to proceed.  Vital Signs: Because this visit was a virtual/telehealth visit, some criteria may be missing or patient reported. Any vitals not documented were not able to be obtained and vitals that have been documented are patient reported.  Patient Medicare AWV questionnaire was completed by the patient on 09/05/23; I have confirmed that all information answered by patient is correct and no changes since this date.  Cardiac Risk Factors include: advanced age (>64men, >29 women);obesity (BMI >30kg/m2);dyslipidemia;hypertension     Objective:    Today's Vitals   09/05/23 0933  Weight: 179 lb (81.2 kg)  Height: 5\' 2"  (1.575 m)   Body mass index is 32.74 kg/m.     09/05/2023    9:45 AM 05/10/2023    2:07 PM 08/30/2022    9:32 AM 05/06/2021    2:04 PM 09/30/2019   11:00 AM 07/30/2019   11:03 AM 07/25/2018   11:06 AM  Advanced Directives  Does Patient Have a Medical Advance Directive? Yes Yes Yes Yes No Yes Yes  Type of Estate agent of [place];Living will Healthcare Power of [place];Living will Living will Healthcare Power of [place];Living will  Healthcare Power of [place];Living will Living will;Healthcare Power of Attorney  Does patient want to make changes to medical advance directive? No - Patient declined  No - Patient declined   No - Patient declined Yes (MAU/Ambulatory/Procedural Areas - Information given)  Copy of Healthcare Power of Attorney in Chart?  No - copy  requested  No - copy requested  Yes - validated most recent copy scanned in chart (See row information) Yes  Would patient like information on creating a medical advance directive?     No - Patient declined      Current Medications (verified) Outpatient Encounter Medications as of 09/05/2023  Medication Sig   ammonium lactate (AMLACTIN) 12 % cream Apply topically as needed for dry skin.   Apoaequorin (PREVAGEN) 10 MG CAPS Take by mouth.   celecoxib (CELEBREX) 200 MG capsule Take 1 capsule (200 mg total) by mouth daily.   Cholecalciferol (VITAMIN D-1000 MAX ST) 25 MCG (1000 UT) tablet Take by mouth.   escitalopram (LEXAPRO) 5 MG tablet TAKE ONE TABLET BY MOUTH IN THE MORNING   Flaxseed Oil OIL 1,000 mg.   fluticasone-salmeterol (ADVAIR) 250-50 MCG/ACT AEPB Inhale 1 puff into the lungs 2 (two) times daily.   latanoprost (XALATAN) 0.005 % ophthalmic solution Place 1 drop into both eyes daily.   lovastatin (MEVACOR) 40 MG tablet TAKE ONE TABLET BY MOUTH NIGHTLY/ labs for further refills   meclizine (ANTIVERT) 25 MG tablet Take 1 tablet (25 mg total) by mouth 3 (three) times daily as needed for dizziness.   Multiple Vitamins-Minerals (PRESERVISION AREDS 2) CAPS Take by mouth.   olmesartan (BENICAR) 20 MG tablet Take 1 tablet (20 mg total) by mouth daily.   omeprazole (PRILOSEC) 40 MG capsule Take 1 capsule (40 mg total) by mouth daily.   timolol (BETIMOL) 0.5 % ophthalmic solution Place 1 drop into the left eye 2 (two) times daily.   triamcinolone  cream (KENALOG) 0.1 % Apply 1 Application topically 2 (two) times daily as needed. Do not use for more than 2 weeks at a time   TURMERIC PO Take 1,000 mg by mouth.   vitamin B-12 (CYANOCOBALAMIN) 1000 MCG tablet Take 1,000 mcg by mouth daily.    calcium citrate (CALCITRATE - DOSED IN MG ELEMENTAL CALCIUM) 950 (200 Ca) MG tablet Take by mouth. (Patient not taking: Reported on 09/05/2023)   calcium citrate-vitamin D (CITRACAL+D) 315-200 MG-UNIT tablet  Take 1 tablet by mouth 2 (two) times daily. (Patient not taking: Reported on 09/05/2023)   No facility-administered encounter medications on file as of 09/05/2023.    Allergies (verified) Aleve [naproxen sodium], Lisinopril, and Azithromycin   History: Past Medical History:  Diagnosis Date   Dermatitis    seborrehic scalp   Hiatal hernia    History of basal cell carcinoma 07/20/2017   On nose.    History of melanoma 07/20/2017   Right upper arm    Hyperlipidemia    Macular degeneration    Past Surgical History:  Procedure Laterality Date   BREAST CYST EXCISION Right 09/1962   CATARACT EXTRACTION  03/2015   melonoma Right 06/2016   arm   MOHS SURGERY Left 06/2016   nose   Family History  Problem Relation Age of Onset   Cancer Mother        pancreatic   Cancer Father        liver   Hyperlipidemia Sister    Macular degeneration Sister    Atrial fibrillation Maternal Aunt    Cancer - Prostate Brother    Breast cancer Neg Hx    Social History   Socioeconomic History   Marital status: Single    Spouse name: Not on file   Number of children: 0   Years of education: 16   Highest education level: Master's degree (e.g., MA, MS, MEng, MEd, MSW, MBA)  Occupational History   Occupation: retired    Comment: Programmer, 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  Tobacco Use   Smoking status: Never   Smokeless tobacco: Never  Vaping Use   Vaping status: Never Used  Substance and Sexual Activity   Alcohol use: Not Currently    Alcohol/week: 1.0 standard drink of alcohol    Types: 1 Glasses of wine per week    Comment: occasionally   Drug use: No   Sexual activity: Never  Other Topics Concern   Not on file  Social History Narrative   She has a masters degree in Contractor.  She lives alone in a retirement community.  Her sister is Youth worker.    Social Determinants of Health   Financial Resource Strain: Low Risk  (09/05/2023)   Overall  Financial Resource Strain (CARDIA)    Difficulty of Paying Living Expenses: Not hard at all  Food Insecurity: No Food Insecurity (09/05/2023)   Hunger Vital Sign    Worried About Running Out of Food in the Last Year: Never true    Ran Out of Food in the Last Year: Never true  Transportation Needs: No Transportation Needs (09/05/2023)   PRAPARE - Administrator, Civil Service (Medical): No    Lack of Transportation (Non-Medical): No  Physical Activity: Insufficiently Active (09/05/2023)   Exercise Vital Sign    Days of Exercise per Week: 7 days    Minutes of Exercise per Session: 10 min  Stress: No Stress Concern Present (09/05/2023)   [place]  Institute of Occupational Health - Occupational Stress Questionnaire    Feeling of Stress : Only a little  Social Connections: Moderately Isolated (09/05/2023)   Social Connection and Isolation Panel [NHANES]    Frequency of Communication with Friends and Family: More than three times a week    Frequency of Social Gatherings with Friends and Family: More than three times a week    Attends Religious Services: More than 4 times per year    Active Member of [employer] or Organizations: No    Attends Engineer, structural: Never    Marital Status: Never married    Tobacco Counseling Counseling given: Not Answered   Clinical Intake:  Pre-visit preparation completed: No  Pain : No/denies pain     BMI - recorded: 32.7 Nutritional Status: BMI > 30  Obese Nutritional Risks: None Diabetes: No  How often do you need to have someone help you when you read instructions, pamphlets, or other written materials from your doctor or pharmacy?: 1 - Never What is the last grade level you completed in school?: 16  Interpreter Needed?: No      Activities of Daily Living    09/05/2023    9:35 AM  In your present state of health, do you have any difficulty performing the following activities:  Hearing? 0  Vision? 0  Difficulty  concentrating or making decisions? 0  Walking or climbing stairs? 1  Comment climbing stairs is difficult, goes slow  Dressing or bathing? 0  Doing errands, shopping? 0  Preparing Food and eating ? N  Using the Toilet? N  In the past six months, have you accidently leaked urine? Y  Comment wears poise pad  Do you have problems with loss of bowel control? N  Managing your Medications? N  Managing your Finances? N  Housekeeping or managing your Housekeeping? N    Patient Care Team: Agapito Games, MD as PCP - General (Family Medicine) Felicity Pellegrini,  MD as Referring Physician (Ophthalmology) [place], [place] [facility].  Shelby Mattocks, MD Neurology     Indicate any recent Medical Services you may have received from other than [facility] providers in the past year (date may be approximate).     Assessment:   This is a routine wellness examination for Anna [name].  Hearing/Vision screen Hearing Screening - Comments:: Unable to test, grossly intact Vision Screening - Comments:: Unable to test, no issues, wears reading glasses   Goals Addressed             This Visit's Progress    Disease Progression Prevented or Minimized       Evidence-based guidance:   Prepare patient for laboratory and diagnostic exams based on risk and presentation.  Encourage lifestyle changes, such as increased intake of plant-based foods, stress reduction, consistent physical activity and smoking cessation to prevent long-term complications and chronic disease.   Individualize activity and exercise recommendations while considering potential limitations, such as neuropathy, retinopathy or the ability to prevent hyperglycemia or hypoglycemia.   Assess signs/symptoms and risk factors for hypertension, sleep-disordered breathing, neuropathy (including changes in gait and balance), retinopathy, nephropathy and sexual dysfunction.  Prepare patient for use of pharmacologic therapy that may include antihypertensive,  analgesic, statin after 85 years of age with periodic adjustments, based on presenting chronic condition and laboratory results.   Address pregnancy planning and contraceptive choice, especially when prescribing antihypertensive or statin.  Ensure completion of annual comprehensive foot exam and dilated eye exam beginning at puberty or 85 years of  age (whichever is earlier) once the child has been diabetic for 5 years.  Implement additional individualized goals and interventions based on identified risk factors.  Prepare patient for consultation or referral for specialist care, such as ophthalmology, neurology, cardiology, podiatry, nephrology or perinatology.    Notes: "stay healthy and independent"       Depression Screen    09/05/2023    9:43 AM 08/30/2023   10:32 AM 04/07/2023    3:02 PM 10/25/2022   12:06 PM 08/30/2022    9:33 AM 02/07/2022   10:53 AM 07/30/2019   11:04 AM  PHQ 2/9 Scores  PHQ - 2 Score 0 0 0 0 0 4 0  PHQ- 9 Score 0 0    7     Fall Risk    09/05/2023    9:47 AM 04/07/2023    3:01 PM 10/25/2022   12:06 PM 08/30/2022    9:32 AM 03/22/2022    1:14 PM  Fall Risk   Falls in the past year? 0 0 1 0 0  Number falls in past yr: 0 0 0 0 0  Injury with Fall? 0 0 1 0 0  Risk for fall due to : No Fall Risks No Fall Risks History of fall(s) No Fall Risks No Fall Risks  Follow up  Falls evaluation completed Falls evaluation completed Falls evaluation completed Falls prevention discussed;Falls evaluation completed    MEDICARE RISK AT HOME: Medicare Risk at Home Any stairs in or around the home?: No If so, are there any without handrails?: No Home free of loose throw rugs in walkways, pet beds, electrical cords, etc?: Yes Adequate lighting in your home to reduce risk of falls?: Yes Life alert?: Yes Use of a cane, walker or w/c?: No Grab bars in the bathroom?: Yes Shower chair or bench in shower?: Yes Elevated toilet seat or a handicapped toilet?: Yes  TIMED UP AND  GO:  Was the test performed?  No    Cognitive Function:      01/04/2023   10:28 AM  Montreal Cognitive Assessment   Visuospatial/ Executive (0/5) 4  Naming (0/3) 3  Attention: Read list of digits (0/2) 2  Attention: Read list of letters (0/1) 1  Attention: Serial 7 subtraction starting at 100 (0/3) 1  Language: Repeat phrase (0/2) 2  Language : Fluency (0/1) 1  Abstraction (0/2) 2  Delayed Recall (0/5) 3  Orientation (0/6) 6  Total 25  Adjusted Score (based on education) 25      09/05/2023    9:48 AM 08/30/2022    9:42 AM 07/30/2019   11:07 AM 07/25/2018   11:18 AM  6CIT Screen  What Year? 0 points 0 points 0 points 0 points  What month? 0 points 0 points 0 points 0 points  What time? 0 points 0 points 0 points 0 points  Count back from 20 0 points 0 points 0 points 0 points  Months in reverse 0 points 0 points 0 points 0 points  Repeat phrase 0 points 0 points 0 points 2 points  Total Score 0 points 0 points 0 points 2 points    Immunizations Immunization History  Administered Date(s) Administered   Fluad Quad(high Dose 65+) 06/22/2020, 07/25/2022   Fluad Trivalent(High Dose 65+) 05/22/2023   Influenza, High Dose Seasonal PF 08/03/2015, 06/30/2017, 07/10/2018, 05/30/2019   Janssen (J&J) SARS-COV-2 Vaccination 12/19/2019, 07/31/2020   Moderna SARS-COV2 Booster Vaccination 04/23/2021   Pfizer(Comirnaty)Fall Seasonal Vaccine 12 years and older 07/25/2022  Tdap 09/26/2010, 09/26/2013, 06/16/2022   Zoster Recombinant(Shingrix) 11/14/2022    TDAP status: Up to date  Flu Vaccine status: Up to date  Pneumococcal vaccine status: Due, Education has been provided regarding the importance of this vaccine. Advised may receive this vaccine at local pharmacy or Health Dept. Aware to provide a copy of the vaccination record if obtained from local pharmacy or Health Dept. Verbalized acceptance and understanding.  Covid-19 vaccine status: Completed vaccines  Qualifies for  Shingles Vaccine? Yes   Zostavax completed Yes   Shingrix Completed?: Yes  Screening Tests Health Maintenance  Topic Date Due   Pneumonia Vaccine 34+ Years old (1 of 2 - PCV) Never done   Zoster Vaccines- Shingrix (2 of 2) 01/09/2023   COVID-19 Vaccine (4 - 2023-24 season) 09/15/2023 (Originally 05/28/2023)   Medicare Annual Wellness (AWV)  09/04/2024   DTaP/Tdap/Td (4 - Td or Tdap) 06/16/2032   INFLUENZA VACCINE  Completed   DEXA SCAN  Completed   HPV VACCINES  Aged Out    Health Maintenance  Health Maintenance Due  Topic Date Due   Pneumonia Vaccine 11+ Years old (1 of 2 - PCV) Never done   Zoster Vaccines- Shingrix (2 of 2) 01/09/2023 Will notify PCP dose # 2 date     Colorectal cancer screening: No longer required.   Mammogram status: Completed 09/28/22. Repeat every year  Bone Density status: Completed 08/08/2018. Results reflect: Bone density results: OSTEOPENIA. Repeat every 2 years.Will discuss with PCP.   Lung Cancer Screening: (Low Dose CT Chest recommended if Age 86-80 years, 20 pack-year currently smoking OR have quit w/in 15years.) does not qualify.   Lung Cancer Screening Referral: n/a  Additional Screening:  Hepatitis C Screening: does not qualify; Completed n/a   Vision Screening: Recommended annual ophthalmology exams for early detection of glaucoma and other disorders of the eye. Is the patient up to date with their annual eye exam?  Yes  Who is the provider or what is the name of the office in which the patient attends annual eye exams? Felicity Pellegrini, [facility] in [place] If pt is not established with a provider, would they like to be referred to a provider to establish care? No .   Dental Screening: Recommended annual dental exams for proper oral hygiene  Diabetic Foot Exam: n/a   Community Resource Referral / Chronic Care Management: CRR required this visit?  No   CCM required this visit?  No     Plan:     I have personally reviewed  and noted the following in the patient's chart:   Medical and social history Use of alcohol, tobacco or illicit drugs  Current medications and supplements including opioid prescriptions. Patient is not currently taking opioid prescriptions. Functional ability and status Nutritional status Physical activity Advanced directives List of other physicians Hospitalizations, surgeries, and ER visits in previous 12 months: no  Vitals Screenings to include cognitive, depression, and falls Referrals and appointments  In addition, I have reviewed and discussed with patient certain preventive protocols, quality metrics, and best practice recommendations. A written personalized care plan for preventive services as well as general preventive health recommendations were provided to patient.     Novella Olive, FNP   09/05/2023   After Visit Summary: Not on My Chart.   Follow-up with PCP scheduled. Discuss next Bone density screening. # 2 Shingrix date to PCP. Ask [facility] for records to provide to PCP office.  Discuss with PCP concerning pneumonia vaccine.

## 2023-09-07 NOTE — Progress Notes (Signed)
No hepatitis

## 2023-09-18 ENCOUNTER — Telehealth: Payer: Self-pay

## 2023-09-18 DIAGNOSIS — Z1231 Encounter for screening mammogram for malignant neoplasm of breast: Secondary | ICD-10-CM

## 2023-09-18 NOTE — Telephone Encounter (Signed)
Copied from CRM [id number]. Topic: General - Other >> Sep 15, 2023  2:46 PM Anna [name] wrote: Reason for CRM: Patient requesting appointment for mammogram after January 3rd. Please call patient at [phone].

## 2023-09-18 NOTE — Telephone Encounter (Signed)
Pended order for your review before sending

## 2023-10-04 DIAGNOSIS — L814 Other melanin hyperpigmentation: Secondary | ICD-10-CM | POA: Diagnosis not present

## 2023-10-04 DIAGNOSIS — D225 Melanocytic nevi of trunk: Secondary | ICD-10-CM | POA: Diagnosis not present

## 2023-10-04 DIAGNOSIS — Z85820 Personal history of malignant melanoma of skin: Secondary | ICD-10-CM | POA: Diagnosis not present

## 2023-10-04 DIAGNOSIS — L820 Inflamed seborrheic keratosis: Secondary | ICD-10-CM | POA: Diagnosis not present

## 2023-10-04 DIAGNOSIS — L578 Other skin changes due to chronic exposure to nonionizing radiation: Secondary | ICD-10-CM | POA: Diagnosis not present

## 2023-10-18 ENCOUNTER — Telehealth: Payer: Self-pay | Admitting: Family Medicine

## 2023-10-18 ENCOUNTER — Other Ambulatory Visit: Payer: Self-pay

## 2023-10-18 DIAGNOSIS — Z1231 Encounter for screening mammogram for malignant neoplasm of breast: Secondary | ICD-10-CM

## 2023-10-18 NOTE — Progress Notes (Signed)
Mammogram ordered

## 2023-10-18 NOTE — Telephone Encounter (Signed)
Patient advised a new order for imaging in [place] has been placed.

## 2023-10-18 NOTE — Telephone Encounter (Signed)
Copied from CRM [id number]. Topic: Appointments - Scheduling Inquiry for Clinic >> Oct 18, 2023  2:20 PM Alcus Dad H wrote: Reason for CRM: Patient called regarding mammogram. Says she never heard from anyone and has been calling since last month. Spoke with CAL and confirmed mammogram was ordered and sent to [facility] location. Patient is wanting mammogram to be done at the [place] location, downstairs. It is more convenient for her. Patient also would like someone to please give her a call this time to get this scheduled.  Please call patient and let her know mammogram has been ordered as request

## 2023-10-30 ENCOUNTER — Encounter: Payer: Self-pay | Admitting: Neurology

## 2023-10-30 ENCOUNTER — Ambulatory Visit: Payer: Medicare Other | Admitting: Neurology

## 2023-10-30 VITALS — BP 120/78 | HR 80 | Ht 62.0 in | Wt 173.0 lb

## 2023-10-30 DIAGNOSIS — R569 Unspecified convulsions: Secondary | ICD-10-CM

## 2023-10-30 DIAGNOSIS — R419 Unspecified symptoms and signs involving cognitive functions and awareness: Secondary | ICD-10-CM

## 2023-10-30 NOTE — Patient Instructions (Addendum)
Routine EEG  If normal, then we will proceed with the 3 ambulatory EEG  If EEG shows epileptiform discharges, then we will start Anti seizure medication Continue with your other medications  Follow up in a year or sooner if worse

## 2023-10-30 NOTE — Progress Notes (Signed)
[facility]  PATIENT: Anna [name] DOB: January 16, 1936  REQUESTING CLINICIAN: Agapito Games, * HISTORY FROM: Patient REASON FOR VISIT: Cognitive impairment/ Event concerning for seizure   HISTORICAL  CHIEF COMPLAINT:  Chief Complaint  Patient presents with   New Patient (Initial Visit)    Pt in 13, here alone  Pt is referred for memory changes, tongue biting and concern for seizures.     HISTORY OF PRESENT ILLNESS:  This 88 year old woman past medical history of hypertension, hyperlipidemia, GERD, who is presenting with complaint of memory loss and events concerning for seizures.  In terms of the memory loss, patient tells me that his memory is not what it used to be.  Currently she resides at a independent living living facility.  She is independent in all activities of daily living.  She tells me that sometimes she is forgetful, she can walk into a room and forget the reason why she was going into that room.  Again other than that she is independent all ADLs. She also reports history of event that she described as word finding difficulty.  She tells me that she used to be a Runner, broadcasting/film/video and sometimes she has word finding difficulty and short-term memory.  Word finding difficulty sometimes when talking to her sister over the phone. She reports on a few occasions, she woke up with with night sweat and 1 specific occasion she felt like she was having a dream but she woke up outside of her door confused not knowing how to open the door. The door was locked.  Her neighbor has to call the CNA who comes to open the door.  When they got into the room, they found blood on the pillow, blood on her nightgown and she had bruises in her forearm.  Not sure how that happened.  This was only a one-time event. She denies any previous history of seizures.  Denies any family history of dementia.   TBI:  No past history of TBI Stroke:   no past history of stroke Seizures:   no past  history of seizures Sleep:   STOP BANG score   Mood: yes, takes medication Family history of Dementia: Denies  Functional status: independent in all ADLs and IADLs Patient lives at an indenpent living facility . Cooking: no Cleaning: no Shopping: no issues  Bathing: no issues  Toileting: no issues  Driving: no issues  Bills: no issues  Medications: no issues  Ever left the stove on by accident?: n/a Forget how to use items around the house?: n/a Getting lost going to familiar places?: denies  Forgetting loved ones names?: denies  Word finding difficulty? Yes  Sleep: Great   OTHER MEDICAL CONDITIONS: Hypertension, Hyperlipidemia, GERD,    REVIEW OF SYSTEMS: Full 14 system review of systems performed and negative with exception of: As noted in the HPI  ALLERGIES: Allergies  Allergen Reactions   Aleve [Naproxen Sodium]     Causes elevation in cholesterol    Lisinopril Other (See Comments)    Cough   Azithromycin Rash    HOME MEDICATIONS: Outpatient Medications Prior to Visit  Medication Sig Dispense Refill   ammonium lactate (AMLACTIN) 12 % cream Apply topically as needed for dry skin.     Apoaequorin (PREVAGEN) 10 MG CAPS Take by mouth.     bevacizumab (AVASTIN) 1.25 mg/0.1 mL SOLN 1.25 mg by Intravitreal route every 6 (six) weeks.     celecoxib (CELEBREX) 200 MG capsule Take 1 capsule (200 mg  total) by mouth daily. 90 capsule 3   Cholecalciferol (VITAMIN D-1000 MAX ST) 25 MCG (1000 UT) tablet Take by mouth.     escitalopram (LEXAPRO) 5 MG tablet TAKE ONE TABLET BY MOUTH IN THE MORNING 90 tablet 0   Flaxseed Oil OIL 1,000 mg.     fluticasone-salmeterol (ADVAIR) 250-50 MCG/ACT AEPB Inhale 1 puff into the lungs 2 (two) times daily. 60 each 11   latanoprost (XALATAN) 0.005 % ophthalmic solution Place 1 drop into both eyes daily.     lovastatin (MEVACOR) 40 MG tablet TAKE ONE TABLET BY MOUTH NIGHTLY/ labs for further refills 90 tablet 0   meclizine (ANTIVERT) 25 MG tablet  Take 1 tablet (25 mg total) by mouth 3 (three) times daily as needed for dizziness. 30 tablet 0   Multiple Vitamins-Minerals (PRESERVISION AREDS 2) CAPS Take by mouth.     olmesartan (BENICAR) 20 MG tablet Take 1 tablet (20 mg total) by mouth daily. 90 tablet 1   omeprazole (PRILOSEC) 40 MG capsule Take 1 capsule (40 mg total) by mouth daily. 90 capsule 3   timolol (BETIMOL) 0.5 % ophthalmic solution Place 1 drop into the left eye 2 (two) times daily.     triamcinolone cream (KENALOG) 0.1 % Apply 1 Application topically 2 (two) times daily as needed. Do not use for more than 2 weeks at a time 30 g 0   TURMERIC PO Take 1,000 mg by mouth.     vitamin B-12 (CYANOCOBALAMIN) 1000 MCG tablet Take 1,000 mcg by mouth daily.      calcium citrate (CALCITRATE - DOSED IN MG ELEMENTAL CALCIUM) 950 (200 Ca) MG tablet Take by mouth. (Patient not taking: Reported on 10/30/2023)     calcium citrate-vitamin D (CITRACAL+D) 315-200 MG-UNIT tablet Take 1 tablet by mouth 2 (two) times daily. (Patient not taking: Reported on 10/30/2023)     No facility-administered medications prior to visit.    PAST MEDICAL HISTORY: Past Medical History:  Diagnosis Date   Dermatitis    seborrehic scalp   Hiatal hernia    History of basal cell carcinoma 07/20/2017   On nose.    History of melanoma 07/20/2017   Right upper arm    Hyperlipidemia    Macular degeneration     PAST SURGICAL HISTORY: Past Surgical History:  Procedure Laterality Date   BREAST CYST EXCISION Right 09/1962   CATARACT EXTRACTION  03/2015   melonoma Right 06/2016   arm   MOHS SURGERY Left 06/2016   nose    FAMILY HISTORY: Family History  Problem Relation Age of Onset   Cancer Mother        pancreatic   Cancer Father        liver   Hyperlipidemia Sister    Macular degeneration Sister    Atrial fibrillation Maternal Aunt    Cancer - Prostate Brother    Breast cancer Neg Hx     SOCIAL HISTORY: Social History   Socioeconomic History    Marital status: Single    Spouse name: Not on file   Number of children: 0   Years of education: 16   Highest education level: Master's degree (e.g., MA, MS, MEng, MEd, MSW, MBA)  Occupational History   Occupation: retired    Comment: Programmer, 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  Tobacco Use   Smoking status: Never   Smokeless tobacco: Never  Vaping Use   Vaping status: Never Used  Substance and Sexual Activity  Alcohol use: Not Currently   Drug use: No   Sexual activity: Never  Other Topics Concern   Not on file  Social History Narrative   She has a masters degree in creative arts.  She lives alone in a retirement community.  Her sister is Youth worker.    Social Drivers of Corporate investment banker Strain: Low Risk  (09/05/2023)   Overall Financial Resource Strain (CARDIA)    Difficulty of Paying Living Expenses: Not hard at all  Food Insecurity: No Food Insecurity (09/05/2023)   Hunger Vital Sign    Worried About Running Out of Food in the Last Year: Never true    Ran Out of Food in the Last Year: Never true  Transportation Needs: No Transportation Needs (09/05/2023)   PRAPARE - Administrator, Civil Service (Medical): No    Lack of Transportation (Non-Medical): No  Physical Activity: Insufficiently Active (09/05/2023)   Exercise Vital Sign    Days of Exercise per Week: 7 days    Minutes of Exercise per Session: 10 min  Stress: No Stress Concern Present (09/05/2023)   [employer] of Occupational Health - Occupational Stress Questionnaire    Feeling of Stress : Only a little  Social Connections: Moderately Isolated (09/05/2023)   Social Connection and Isolation Panel [NHANES]    Frequency of Communication with Friends and Family: More than three times a week    Frequency of Social Gatherings with Friends and Family: More than three times a week    Attends Religious Services: More than 4 times per year    Active Member of  [employer] or Organizations: No    Attends Banker Meetings: Never    Marital Status: Never married  Intimate Partner Violence: Not At Risk (09/05/2023)   Humiliation, Afraid, Rape, and Kick questionnaire    Fear of Current or Ex-Partner: No    Emotionally Abused: No    Physically Abused: No    Sexually Abused: No    PHYSICAL EXAM  GENERAL EXAM/CONSTITUTIONAL: Vitals:  Vitals:   10/30/23 1455  BP: 120/78  Pulse: 80  Weight: 173 lb (78.5 kg)  Height: 5\' 2"  (1.575 m)   Body mass index is 31.64 kg/m. Wt Readings from Last 3 Encounters:  10/30/23 173 lb (78.5 kg)  09/05/23 179 lb (81.2 kg)  09/01/23 179 lb (81.2 kg)   Patient is in no distress; well developed, nourished and groomed; neck is supple  MUSCULOSKELETAL: Gait, strength, tone, movements noted in Neurologic exam below  NEUROLOGIC: MENTAL STATUS:      No data to display             10/30/2023    3:04 PM 01/04/2023   10:28 AM  Montreal Cognitive Assessment   Visuospatial/ Executive (0/5) 4 4  Naming (0/3) 2 3  Attention: Read list of digits (0/2) 2 2  Attention: Read list of letters (0/1) 1 1  Attention: Serial 7 subtraction starting at 100 (0/3) 2 1  Language: Repeat phrase (0/2) 2 2  Language : Fluency (0/1) 1 1  Abstraction (0/2) 2 2  Delayed Recall (0/5) 5 3  Orientation (0/6) 6 6  Total 27 25  Adjusted Score (based on education)  25    CRANIAL NERVE:  2nd, 3rd, 4th, 6th- visual fields full to confrontation, extraocular muscles intact, no nystagmus 5th - facial sensation symmetric 7th - facial strength symmetric 8th - hearing intact 9th - palate elevates symmetrically, uvula midline 11th - shoulder  shrug symmetric 12th - tongue protrusion midline  MOTOR:  normal bulk and tone, full strength in the BUE, BLE  SENSORY:  normal and symmetric to light touch  COORDINATION:  finger-nose-finger, fine finger movements normal  GAIT/STATION:  normal   DIAGNOSTIC DATA (LABS, IMAGING,  TESTING) - I reviewed patient records, labs, notes, testing and imaging myself where available.  Lab Results  Component Value Date   WBC 11.2 (H) 09/01/2023   HGB 11.8 09/01/2023   HCT 35.9 09/01/2023   MCV 86 09/01/2023   PLT 209 09/01/2023      Component Value Date/Time   NA 133 (L) 09/01/2023 1448   K 3.9 09/01/2023 1448   CL 99 09/01/2023 1448   CO2 24 09/01/2023 1448   GLUCOSE 89 09/01/2023 1448   GLUCOSE 98 01/04/2023 1058   BUN 17 09/01/2023 1448   CREATININE 0.97 09/01/2023 1448   CREATININE 0.76 01/04/2023 1058   CALCIUM 7.4 (L) 09/01/2023 1448   PROT 5.0 (L) 09/01/2023 1448   ALBUMIN 3.0 (L) 09/01/2023 1448   AST 28 09/01/2023 1448   ALT 21 09/01/2023 1448   ALKPHOS 180 (H) 09/01/2023 1448   BILITOT 0.8 09/01/2023 1448   GFRNONAA 72 05/25/2020 1631   GFRAA 83 05/25/2020 1631   Lab Results  Component Value Date   CHOL 144 02/07/2022   HDL 51 02/07/2022   LDLCALC 73 02/07/2022   TRIG 123 02/07/2022   CHOLHDL 2.8 02/07/2022   Lab Results  Component Value Date   HGBA1C 5.9 (A) 10/25/2022   Lab Results  Component Value Date   VITAMINB12 1,147 (H) 03/16/2020   Lab Results  Component Value Date   TSH 1.230 07/05/2023    MRI Brain 01/09/2023 1. Normal parenchymal volume without disproportionate lobar or hippocampal atrophy. 2. Small remote lacunar infarct in the right frontal white matter and moderate background chronic small-vessel ischemic change.   ASSESSMENT AND PLAN  88 y.o. year old female with history of hypertension, hyperlipidemia, GERD, who is presenting with memory complaint and events concerning for seizure.  In terms of the memory complaint, she does live in an independent living facility, she is independent all ADLs, and her MoCA today is normal at 27.  Patient does have normal cognition.  At present, no additional neurological workup indicated.  In terms of the event concerning for seizures, my suspicion is high.  She did have a previous  right frontal stroke and moderate chronic small vessel ischemic changes.  Plan will be to obtain a routine EEG and if normal will proceed with a 3-day ambulatory EEG.  If any abnormality, will start patient on antiseizure medication.  Continue to follow with PCP, continue your other medications and return in 1 year or sooner if worse.   1. Cognitive complaints with normal exam   2. Seizure-like activity [facility])      Patient Instructions  Routine EEG  If normal, then we will proceed with the 3 ambulatory EEG  If EEG shows epileptiform discharges, then we will start Anti seizure medication Continue with your other medications  Follow up in a year or sooner if worse      Orders Placed This Encounter  Procedures   EEG adult    No orders of the defined types were placed in this encounter.   Return in about 1 year (around 10/29/2024).    Windell Norfolk, MD 10/30/2023, 7:41 PM  [facility] [address], [address] [place], [place] [address] [phone]

## 2023-11-09 ENCOUNTER — Telehealth: Payer: Self-pay | Admitting: Family Medicine

## 2023-11-09 ENCOUNTER — Encounter: Payer: Self-pay | Admitting: Family Medicine

## 2023-11-09 ENCOUNTER — Ambulatory Visit (INDEPENDENT_AMBULATORY_CARE_PROVIDER_SITE_OTHER): Payer: Medicare Other | Admitting: Family Medicine

## 2023-11-09 VITALS — BP 124/78 | HR 68 | Ht 62.0 in | Wt 171.0 lb

## 2023-11-09 DIAGNOSIS — Z23 Encounter for immunization: Secondary | ICD-10-CM | POA: Diagnosis not present

## 2023-11-09 DIAGNOSIS — F418 Other specified anxiety disorders: Secondary | ICD-10-CM

## 2023-11-09 DIAGNOSIS — R7989 Other specified abnormal findings of blood chemistry: Secondary | ICD-10-CM | POA: Diagnosis not present

## 2023-11-09 DIAGNOSIS — G8929 Other chronic pain: Secondary | ICD-10-CM | POA: Diagnosis not present

## 2023-11-09 DIAGNOSIS — M150 Primary generalized (osteo)arthritis: Secondary | ICD-10-CM

## 2023-11-09 DIAGNOSIS — M25561 Pain in right knee: Secondary | ICD-10-CM

## 2023-11-09 DIAGNOSIS — M25562 Pain in left knee: Secondary | ICD-10-CM

## 2023-11-09 DIAGNOSIS — R7301 Impaired fasting glucose: Secondary | ICD-10-CM

## 2023-11-09 DIAGNOSIS — M1711 Unilateral primary osteoarthritis, right knee: Secondary | ICD-10-CM

## 2023-11-09 DIAGNOSIS — I10 Essential (primary) hypertension: Secondary | ICD-10-CM

## 2023-11-09 DIAGNOSIS — M19012 Primary osteoarthritis, left shoulder: Secondary | ICD-10-CM

## 2023-11-09 LAB — POCT GLYCOSYLATED HEMOGLOBIN (HGB A1C): Hemoglobin A1C: 5.5 % (ref 4.0–5.6)

## 2023-11-09 MED ORDER — CELECOXIB 200 MG PO CAPS: 200.0000 mg | ORAL_CAPSULE | Freq: Two times a day (BID) | ORAL | 0 refills | Status: DC

## 2023-11-09 MED ORDER — ESCITALOPRAM OXALATE 10 MG PO TABS: 10.0000 mg | ORAL_TABLET | Freq: Every morning | ORAL | 1 refills | Status: DC

## 2023-11-09 NOTE — Assessment & Plan Note (Addendum)
She says her knees have been bothering her more lately especially with the cold.  She currently takes her Celebrex once a day but wants to know if we could go up at least just for short-term.  She has been eating healthier and cut back on portions and has already lost 11 pounds she admits that she probably needs to work on her exercise a little bit more and plans to do so.  Warned about potential fo GAstritis and ulcer while on BID celebrex.

## 2023-11-09 NOTE — Telephone Encounter (Signed)
Please call pt and let her know I meant to recheck her kidney function today.  See if she can come back for labs tomorrow or Monday

## 2023-11-09 NOTE — Assessment & Plan Note (Signed)
Phenomenal job with dietary changes and her A1c is down to 5.5 today which is absolutely fantastic.

## 2023-11-09 NOTE — Assessment & Plan Note (Signed)
Her anxiety is really been high lately she is worried about her finances the rent where she is at the assisted living has gone up and she is not to be able to afford that long-term.  We discussed options including bumping up the Lexapro just for short period of time to see if that is helpful she would like to do that.

## 2023-11-09 NOTE — Progress Notes (Addendum)
Established Patient Office Visit  Subjective  Patient ID: Anna [name], female    DOB: September 04, 1936  Age: 88 y.o. MRN: [id number]  Chief Complaint  Patient presents with   Hypertension    HPI  Today for routine blood pressure follow-up.  She did want to give me an update she did get her second shingles vaccine May 12.  So we will get that entered into the system.  She also saw neurology and they are setting her up for an EEG on February 24 to see if she may have actually had a seizure.  ROS    Objective:     BP 124/78   Pulse 68   Ht 5\' 2"  (1.575 m)   Wt 171 lb (77.6 kg)   SpO2 97%   BMI 31.28 kg/m    Physical Exam Vitals and nursing note reviewed.  Constitutional:      Appearance: Normal appearance.  HENT:     Head: Normocephalic and atraumatic.  Eyes:     Conjunctiva/sclera: Conjunctivae normal.  Cardiovascular:     Rate and Rhythm: Normal rate and regular rhythm.  Pulmonary:     Effort: Pulmonary effort is normal.     Breath sounds: Normal breath sounds.  Skin:    General: Skin is warm and dry.  Neurological:     Mental Status: She is alert.  Psychiatric:        Mood and Affect: Mood normal.      Results for orders placed or performed in visit on 11/09/23  POCT HgB A1C  Result Value Ref Range   Hemoglobin A1C 5.5 4.0 - 5.6 %   HbA1c POC (<> result, manual entry)     HbA1c, POC (prediabetic range)     HbA1c, POC (controlled diabetic range)        The ASCVD Risk score (Arnett DK, et al., 2019) failed to calculate for the following reasons:   The 2019 ASCVD risk score is only valid for ages 30 to 52    Assessment & Plan:   Problem List Items Addressed This Visit       Cardiovascular and Mediastinum   Primary hypertension - Primary   Well controlled. Continue current regimen. Follow up in  19mo         Endocrine   IFG (impaired fasting glucose)   Phenomenal job with dietary changes and her A1c is down to 5.5 today which is absolutely  fantastic.      Relevant Orders   POCT HgB A1C (Completed)     Musculoskeletal and Integument   Primary osteoarthritis, left shoulder   Relevant Medications   celecoxib (CELEBREX) 200 MG capsule     Other   Bilateral knee pain   She says her knees have been bothering her more lately especially with the cold.  She currently takes her Celebrex once a day but wants to know if we could go up at least just for short-term.  She has been eating healthier and cut back on portions and has already lost 11 pounds she admits that she probably needs to work on her exercise a little bit more and plans to do so.  Warned about potential fo GAstritis and ulcer while on BID celebrex.       Anxious depression   Her anxiety is really been high lately she is worried about her finances the rent where she is at the assisted living has gone up and she is not to be able to afford that  long-term.  We discussed options including bumping up the Lexapro just for short period of time to see if that is helpful she would like to do that.      Relevant Medications   escitalopram (LEXAPRO) 10 MG tablet   Other Visit Diagnoses       Primary osteoarthritis of right knee       Relevant Medications   celecoxib (CELEBREX) 200 MG capsule     Primary osteoarthritis involving multiple joints       Relevant Medications   celecoxib (CELEBREX) 200 MG capsule     Encounter for immunization       Relevant Orders   Pneumococcal conjugate vaccine 20-valent (Completed)     Elevated serum creatinine       Relevant Orders   CMP14+EGFR       Return in about 3 months (around 02/06/2024) for Mood.    Nani Gasser, MD

## 2023-11-09 NOTE — Assessment & Plan Note (Signed)
Well controlled. Continue current regimen. Follow up in  6 mo

## 2023-11-13 NOTE — Telephone Encounter (Signed)
 Spoke w/pt she will come back to have these done.

## 2023-11-14 ENCOUNTER — Other Ambulatory Visit: Payer: Medicare Other | Admitting: *Deleted

## 2023-11-14 DIAGNOSIS — R7989 Other specified abnormal findings of blood chemistry: Secondary | ICD-10-CM | POA: Diagnosis not present

## 2023-11-15 LAB — CMP14+EGFR
ALT: 10 [IU]/L (ref 0–32)
AST: 29 [IU]/L (ref 0–40)
Albumin: 4.1 g/dL (ref 3.7–4.7)
Alkaline Phosphatase: 89 [IU]/L (ref 44–121)
BUN/Creatinine Ratio: 17 (ref 12–28)
BUN: 15 mg/dL (ref 8–27)
Bilirubin Total: 0.8 mg/dL (ref 0.0–1.2)
CO2: 22 mmol/L (ref 20–29)
Calcium: 9.3 mg/dL (ref 8.7–10.3)
Chloride: 104 mmol/L (ref 96–106)
Creatinine, Ser: 0.9 mg/dL (ref 0.57–1.00)
Globulin, Total: 2.2 g/dL (ref 1.5–4.5)
Glucose: 82 mg/dL (ref 70–99)
Potassium: 4.4 mmol/L (ref 3.5–5.2)
Sodium: 142 mmol/L (ref 134–144)
Total Protein: 6.3 g/dL (ref 6.0–8.5)
eGFR: 63 mL/min/{1.73_m2} (ref >59)

## 2023-11-15 NOTE — Progress Notes (Signed)
 Call patient: Labs look much better this time sodium is back to normal as well as her calcium levels which looks great.

## 2023-11-20 ENCOUNTER — Ambulatory Visit: Payer: Medicare Other | Admitting: Neurology

## 2023-11-20 DIAGNOSIS — R569 Unspecified convulsions: Secondary | ICD-10-CM

## 2023-11-20 DIAGNOSIS — R4182 Altered mental status, unspecified: Secondary | ICD-10-CM

## 2023-11-20 DIAGNOSIS — R419 Unspecified symptoms and signs involving cognitive functions and awareness: Secondary | ICD-10-CM

## 2023-11-20 NOTE — Procedures (Signed)
    History:  88 year old woman with events concerning for seizures  EEG classification: Awake and drowsy  Duration: 27 minutes   Technical aspects: This EEG study was done with scalp electrodes positioned according to the 10-20 International system of electrode placement. Electrical activity was reviewed with band pass filter of 1-70Hz , sensitivity of 7 uV/mm, display speed of 31mm/sec with a 60Hz  notched filter applied as appropriate. EEG data were recorded continuously and digitally stored.   Description of the recording: The background rhythms of this recording consists of a fairly well modulated medium amplitude alpha rhythm of 9 Hz that is reactive to eye opening and closure. Present in the anterior head region is a 15-20 Hz beta activity. Photic stimulation was performed, did not show any abnormalities. Hyperventilation was not performed. Drowsiness was not seen. No abnormal epileptiform discharges seen during this recording. There was intermittent left frontotemporal focal slowing. There were no electrographic seizure identified.   Abnormality: Intermittent left frontotemporal focal slowing  Impression: This is an abnormal awake EEG due to presence of intermittent left frontotemporal focal slowing. This is consistent with area of neuronal dysfunction in the left frontotemporal region.    Windell Norfolk, MD [facility]

## 2023-11-24 ENCOUNTER — Other Ambulatory Visit: Payer: Self-pay | Admitting: Neurology

## 2023-11-24 ENCOUNTER — Telehealth: Payer: Self-pay

## 2023-11-24 MED ORDER — LEVETIRACETAM 250 MG PO TABS: 250.0000 mg | ORAL_TABLET | Freq: Two times a day (BID) | ORAL | 6 refills | Status: DC

## 2023-11-24 NOTE — Progress Notes (Signed)
 Spoke with patient, discussed abnormal EEG results, will start her on Keppra 250 mg twice daily.

## 2023-11-24 NOTE — Telephone Encounter (Signed)
 Called pt and got her scheduled for her 6 month F/U with Dr. Teresa Coombs f or 05/23/2024 @ 9:15am.  Pt wanted to know if she could drive while being on Keppra. Per Dr. Teresa Coombs she can still drive.

## 2023-11-27 ENCOUNTER — Telehealth: Payer: Self-pay | Admitting: Family Medicine

## 2023-11-27 NOTE — Telephone Encounter (Signed)
 Copied from CRM [id number]. Topic: Clinical - Prescription Issue >> Nov 27, 2023  1:06 PM Tiffany B wrote: Reason for CRM: Caller states escitalopram (LEXAPRO) 10 MG tablet is to strong and would like PCP to prescribe a 5 MG. Caller states the 10 MG is making her to sleepy.

## 2023-11-29 NOTE — Telephone Encounter (Signed)
 Okay, we can certainly decrease the dose.  Also she can try moving to bedtime if it tends to make her little bit more tired instead of taking in the morning.  Does she want to just split her current tabs or go ahead and have [place] send in a new prescription for the 5 mg.

## 2023-11-30 DIAGNOSIS — H43823 Vitreomacular adhesion, bilateral: Secondary | ICD-10-CM | POA: Diagnosis not present

## 2023-11-30 DIAGNOSIS — H401132 Primary open-angle glaucoma, bilateral, moderate stage: Secondary | ICD-10-CM | POA: Diagnosis not present

## 2023-11-30 DIAGNOSIS — H353231 Exudative age-related macular degeneration, bilateral, with active choroidal neovascularization: Secondary | ICD-10-CM | POA: Diagnosis not present

## 2023-11-30 DIAGNOSIS — H35033 Hypertensive retinopathy, bilateral: Secondary | ICD-10-CM | POA: Diagnosis not present

## 2023-11-30 DIAGNOSIS — Z961 Presence of intraocular lens: Secondary | ICD-10-CM | POA: Diagnosis not present

## 2023-12-01 NOTE — Telephone Encounter (Signed)
 Called and left a detailed voice mail message on patients home # ( allowed on DPR )  Requesting a call back as to if needing a new prescription for the 5mg  or if she will just split the current tablets.

## 2023-12-04 ENCOUNTER — Telehealth: Payer: Self-pay

## 2023-12-04 NOTE — Telephone Encounter (Signed)
 Copied from CRM [id number]. Topic: General - Other >> Dec 01, 2023  5:32 PM Ja-Kwan M wrote: Reason for CRM: Patient called back and stated that she will split the pill in half.

## 2023-12-06 ENCOUNTER — Ambulatory Visit: Payer: Medicare Other

## 2023-12-06 DIAGNOSIS — Z1231 Encounter for screening mammogram for malignant neoplasm of breast: Secondary | ICD-10-CM

## 2023-12-10 ENCOUNTER — Other Ambulatory Visit: Payer: Self-pay | Admitting: Family Medicine

## 2023-12-11 NOTE — Progress Notes (Signed)
 Hi Anna [name], your mammogram is normal.

## 2023-12-12 DIAGNOSIS — H26493 Other secondary cataract, bilateral: Secondary | ICD-10-CM | POA: Diagnosis not present

## 2023-12-12 DIAGNOSIS — H401131 Primary open-angle glaucoma, bilateral, mild stage: Secondary | ICD-10-CM | POA: Diagnosis not present

## 2023-12-12 DIAGNOSIS — H353122 Nonexudative age-related macular degeneration, left eye, intermediate dry stage: Secondary | ICD-10-CM | POA: Diagnosis not present

## 2023-12-12 DIAGNOSIS — H353211 Exudative age-related macular degeneration, right eye, with active choroidal neovascularization: Secondary | ICD-10-CM | POA: Diagnosis not present

## 2023-12-12 DIAGNOSIS — H02403 Unspecified ptosis of bilateral eyelids: Secondary | ICD-10-CM | POA: Diagnosis not present

## 2023-12-12 DIAGNOSIS — H526 Other disorders of refraction: Secondary | ICD-10-CM | POA: Diagnosis not present

## 2023-12-12 DIAGNOSIS — D3132 Benign neoplasm of left choroid: Secondary | ICD-10-CM | POA: Diagnosis not present

## 2024-02-02 ENCOUNTER — Ambulatory Visit (INDEPENDENT_AMBULATORY_CARE_PROVIDER_SITE_OTHER): Admitting: Family Medicine

## 2024-02-02 ENCOUNTER — Encounter: Payer: Self-pay | Admitting: Family Medicine

## 2024-02-02 VITALS — BP 144/75 | HR 70 | Resp 18 | Ht 62.0 in | Wt 176.8 lb

## 2024-02-02 DIAGNOSIS — I10 Essential (primary) hypertension: Secondary | ICD-10-CM

## 2024-02-02 DIAGNOSIS — M1711 Unilateral primary osteoarthritis, right knee: Secondary | ICD-10-CM | POA: Diagnosis not present

## 2024-02-02 DIAGNOSIS — F418 Other specified anxiety disorders: Secondary | ICD-10-CM | POA: Diagnosis not present

## 2024-02-02 DIAGNOSIS — M19012 Primary osteoarthritis, left shoulder: Secondary | ICD-10-CM | POA: Diagnosis not present

## 2024-02-02 DIAGNOSIS — M150 Primary generalized (osteo)arthritis: Secondary | ICD-10-CM

## 2024-02-02 DIAGNOSIS — M170 Bilateral primary osteoarthritis of knee: Secondary | ICD-10-CM

## 2024-02-02 MED ORDER — CELECOXIB 200 MG PO CAPS: 200.0000 mg | ORAL_CAPSULE | Freq: Every day | ORAL | 1 refills | Status: DC

## 2024-02-02 MED ORDER — ESCITALOPRAM OXALATE 5 MG PO TABS: 5.0000 mg | ORAL_TABLET | Freq: Every morning | ORAL | 1 refills | Status: DC

## 2024-02-02 NOTE — Assessment & Plan Note (Signed)
 She went to PT once and they had actually recommended aquatic therapy which at the time she did not feel like she could do so she never returned we discussed that we should definitely reengage her for PT especially if it feels like her knees are starting to give out at times think we need to really strengthen her quads and hamstrings and lower legs to reduce fall risk.  She will just need to let them know that she is unable to do aquatic therapy because of the drive.  And if she is not improving then we can work it up further get her back in with Dr. Sandy Crumb.  In the meantime okay to take Celebrex  once daily and she has been using Tylenol at bedtime and some topical rubs.

## 2024-02-02 NOTE — Progress Notes (Signed)
 Established Patient Office Visit  Subjective  Patient ID: Anna [name], female    DOB: 05/08/1936  Age: 88 y.o. MRN: [id number]  Chief Complaint  Patient presents with   Medical Management of Chronic Issues    3 month f/u for mood     HPI  Is here today to follow-up for anxiety and depression when I last saw her in February her anxiety levels had been particularly high she was very worried about finances and her rent going up she currently lives in an assisted living and was worried that she may not be able to stay there long-term because of the cost.  We had discussed bumping up her Lexapro  to 10 mg which she was willing to try and I did encouraged her to come back this month to follow-up.  Sheshe talked with her sister and hr finances and feels like she is going to stay where she is at for now and now that she isn't stressing about that she actually feels much better.  She never ended up taking the full 10 1 mg of Lexapro  she has been splitting it to say at the 5 mg strength.   Also c/o of bilat knee pain- says her knees give out at times.  She saw Dr. Sandy Crumb last August for bilateral knee pain.  She had significant OA of both knees and a small left knee effusion.  She was referred for physical therapy but only went for 1 session.she uses some topical rubs.    She has been limiting her Celebrex  to just once a day instead of twice a day to try to help with her stomach and reduce the impact on her kidneys.  She is doing well with that.  She does have a follow-up this summer with neurology for her seizure disorder she is currently on Keppra .     ROS    Objective:     BP (!) 144/75 (BP Location: Left Arm, Cuff Size: Normal)   Pulse 70   Resp 18   Ht 5\' 2"  (1.575 m)   Wt 176 lb 12 oz (80.2 kg)   SpO2 95%   BMI 32.33 kg/m    Physical Exam Vitals and nursing note reviewed.  Constitutional:      Appearance: Normal appearance.  HENT:     Head: Normocephalic and  atraumatic.  Eyes:     Conjunctiva/sclera: Conjunctivae normal.  Cardiovascular:     Rate and Rhythm: Normal rate and regular rhythm.  Pulmonary:     Effort: Pulmonary effort is normal.     Breath sounds: Normal breath sounds.  Skin:    General: Skin is warm and dry.  Neurological:     Mental Status: She is alert.  Psychiatric:        Mood and Affect: Mood normal.      No results found for any visits on 02/02/24.    The ASCVD Risk score (Arnett DK, et al., 2019) failed to calculate for the following reasons:   The 2019 ASCVD risk score is only valid for ages 24 to 62    Assessment & Plan:   Problem List Items Addressed This Visit       Cardiovascular and Mediastinum   Primary hypertension   Pressure is just mildly elevated today though it is better than when she came in a couple months ago and she was feeling really stressed and anxious.  Will recheck it again before she goes home today I am glad that she  is cut back her Celebrex  to once a day instead of twice a day.        Musculoskeletal and Integument   Primary osteoarthritis, left shoulder   Relevant Medications   celecoxib  (CELEBREX ) 200 MG capsule   Primary osteoarthritis of both knees   She went to PT once and they had actually recommended aquatic therapy which at the time she did not feel like she could do so she never returned we discussed that we should definitely reengage her for PT especially if it feels like her knees are starting to give out at times think we need to really strengthen her quads and hamstrings and lower legs to reduce fall risk.  She will just need to let them know that she is unable to do aquatic therapy because of the drive.  And if she is not improving then we can work it up further get her back in with Dr. Sandy Crumb.  In the meantime okay to take Celebrex  once daily and she has been using Tylenol at bedtime and some topical rubs.      Relevant Medications   celecoxib  (CELEBREX ) 200 MG  capsule   Other Relevant Orders   Ambulatory referral to Physical Therapy     Other   Anxious depression - Primary   Switch back to 5 mg Lexapro  since she is splitting the tens and never really ended up taking it it seems like she has really settle things in her mind and is not going to plan to move at least for the next year maybe even 2 years but if the price is keep going up to afford the assisted living then she will probably have to move but right now she is just settled her mind about it that she is going to stay where she is at at least for another 1 to 2 years.      Relevant Medications   escitalopram  (LEXAPRO ) 5 MG tablet   Other Visit Diagnoses       Primary osteoarthritis of right knee       Relevant Medications   celecoxib  (CELEBREX ) 200 MG capsule     Primary osteoarthritis involving multiple joints       Relevant Medications   celecoxib  (CELEBREX ) 200 MG capsule       Return in about 4 months (around 06/04/2024) for Hypertension, labs .    Duaine German, MD

## 2024-02-02 NOTE — Assessment & Plan Note (Signed)
 Pressure is just mildly elevated today though it is better than when she came in a couple months ago and she was feeling really stressed and anxious.  Will recheck it again before she goes home today I am glad that she is cut back her Celebrex  to once a day instead of twice a day.

## 2024-02-02 NOTE — Assessment & Plan Note (Signed)
 Switch back to 5 mg Lexapro  since she is splitting the tens and never really ended up taking it it seems like she has really settle things in her mind and is not going to plan to move at least for the next year maybe even 2 years but if the price is keep going up to afford the assisted living then she will probably have to move but right now she is just settled her mind about it that she is going to stay where she is at at least for another 1 to 2 years.

## 2024-02-06 ENCOUNTER — Ambulatory Visit: Payer: Medicare Other | Admitting: Family Medicine

## 2024-02-12 ENCOUNTER — Ambulatory Visit: Attending: Family Medicine | Admitting: Physical Therapy

## 2024-02-12 ENCOUNTER — Encounter: Payer: Self-pay | Admitting: Physical Therapy

## 2024-02-12 ENCOUNTER — Other Ambulatory Visit: Payer: Self-pay

## 2024-02-12 DIAGNOSIS — M5459 Other low back pain: Secondary | ICD-10-CM | POA: Insufficient documentation

## 2024-02-12 DIAGNOSIS — R293 Abnormal posture: Secondary | ICD-10-CM | POA: Insufficient documentation

## 2024-02-12 DIAGNOSIS — R531 Weakness: Secondary | ICD-10-CM | POA: Insufficient documentation

## 2024-02-12 DIAGNOSIS — M170 Bilateral primary osteoarthritis of knee: Secondary | ICD-10-CM | POA: Insufficient documentation

## 2024-02-12 DIAGNOSIS — R262 Difficulty in walking, not elsewhere classified: Secondary | ICD-10-CM | POA: Diagnosis not present

## 2024-02-12 DIAGNOSIS — M5030 Other cervical disc degeneration, unspecified cervical region: Secondary | ICD-10-CM | POA: Insufficient documentation

## 2024-02-12 NOTE — Therapy (Signed)
 OUTPATIENT PHYSICAL THERAPY LOWER EXTREMITY EVALUATION   Patient Name: Anna [name] MRN: [id number] DOB:10-24-1935, 88 y.o., female Today's Date: 02/12/2024  END OF SESSION:  PT End of Session - 02/12/24 1215     Visit Number 1    Number of Visits 16    Date for PT Re-Evaluation 04/08/24    Authorization Type [employer] Medicare    Progress Note Due on Visit 10    PT Start Time 1145    PT Stop Time 1219    PT Time Calculation (min) 34 min    Activity Tolerance Patient tolerated treatment well    Behavior During Therapy WFL for tasks assessed/performed             Past Medical History:  Diagnosis Date   Dermatitis    seborrehic scalp   Hiatal hernia    History of basal cell carcinoma 07/20/2017   On nose.    History of melanoma 07/20/2017   Right upper arm    Hyperlipidemia    Macular degeneration    Past Surgical History:  Procedure Laterality Date   BREAST CYST EXCISION Right 09/1962   CATARACT EXTRACTION  03/2015   melonoma Right 06/2016   arm   MOHS SURGERY Left 06/2016   nose   Patient Active Problem List   Diagnosis Date Noted   Bilateral knee pain 11/09/2023   Memory change 01/04/2023   Primary hypertension 02/07/2022   Anxious depression 02/07/2022   DDD (degenerative disc disease), cervical 05/04/2021   Neck pain 05/03/2021   Anorexia 06/30/2020   Umbilical hernia without obstruction and without gangrene 06/30/2020   Meralgia paresthetica of right side 06/22/2020   Venous insufficiency of both lower extremities 06/22/2020   Primary osteoarthritis, left shoulder 06/03/2020   Bilateral lower extremity edema 05/25/2020   Aortic atherosclerosis (HCC) 05/13/2020   Bunion, right foot 11/22/2019   Lichen planus 11/22/2019   Asthma in adult 11/22/2019   Mixed restrictive and obstructive lung disease (HCC) 11/22/2019   Primary osteoarthritis of both knees 11/22/2019   Acute pain of left shoulder 09/05/2019   Chronic bilateral low back pain without  sciatica 09/05/2019   OAB (overactive bladder) 08/08/2019   Insomnia 08/08/2019   History of melanoma 07/20/2017   History of basal cell carcinoma 07/20/2017   IFG (impaired fasting glucose) 07/17/2017   Macular degeneration    Chronic fatigue 08/06/2015   Gastroesophageal reflux disease without esophagitis 08/06/2015   Glaucoma 08/06/2015   Hiatal hernia 08/06/2015   Melanoma of right upper arm (HCC) 08/06/2015   Mixed hyperlipidemia 08/06/2015   Rupture of right biceps tendon 08/06/2015   Biceps tendonitis of both shoulders 08/06/2015    PCP: Greer Leak   REFERRING PROVIDER: Greer Leak  REFERRING DIAG: OA of bilat knees  THERAPY DIAG:  Primary osteoarthritis of both knees  Weakness generalized  Difficulty in walking, not elsewhere classified  Rationale for Evaluation and Treatment: Rehabilitation  ONSET DATE: April 2025  SUBJECTIVE:   SUBJECTIVE STATEMENT: Pt states that for the last month she feels her knees will "give out" from under her. She states she has no warning and no pain, they just "buckle". She states she has not had this problem before. She has not fallen but worries that she might if her knees keep "giving out". Pt states that she can walk about an hour at [employer] but she has to hold on to the cart, she can walk about 10 minutes without holding on before needing to sit.  PERTINENT HISTORY: OA PAIN:  Are you having pain? No  PRECAUTIONS: None  RED FLAGS: None   WEIGHT BEARING RESTRICTIONS: No  FALLS:  Has patient fallen in last 6 months? No  LIVING ENVIRONMENT: Lives with: lives alone Lives in: Other independent living facility Stairs: No Has following equipment at home: None  OCCUPATION: retired  PLOF: Independent  PATIENT GOALS: prevent falls and not have to use a RW or cane  NEXT MD VISIT: PRN  OBJECTIVE:  Note: Objective measures were completed at Evaluation unless otherwise noted.   PATIENT SURVEYS:  KOOS -12 :  70.8%   PALPATION: No TTP bilat knees  LOWER EXTREMITY ROM:  Active ROM Right eval Left eval  Hip flexion    Hip extension    Hip abduction    Hip adduction    Hip internal rotation    Hip external rotation    Knee flexion 120 120  Knee extension 0 0  Ankle dorsiflexion    Ankle plantarflexion    Ankle inversion    Ankle eversion     (Blank rows = not tested)  LOWER EXTREMITY MMT:  MMT Right eval Left eval  Hip flexion 4- 4-  Hip extension 4- 4-  Hip abduction 4- 4-  Hip adduction    Hip internal rotation    Hip external rotation    Knee flexion 4 4  Knee extension 4 4  Ankle dorsiflexion    Ankle plantarflexion    Ankle inversion    Ankle eversion     (Blank rows = not tested)   FUNCTIONAL TESTS:  5 times sit to stand: 20.68 seconds 3 minute walk test: 414 ft total Gait speed: 2.3 ft/seconds Stair negotiation requires UE support for descending stairs due to knee instability Tandem stance - unable bilat                                                                                                                                 TREATMENT DATE: 02/12/24 See HEP Pt educated on PT POC and goals, rationale for treatment    PATIENT EDUCATION:  Education details: PT POC and goals, HEP Person educated: Patient Education method: Explanation, Demonstration, and Handouts Education comprehension: verbalized understanding and returned demonstration  HOME EXERCISE PROGRAM: Access Code: [facility] URL: [email or web address] Date: 02/12/2024 Prepared by: Lowery Rue  Exercises - Hip Abduction with Resistance Loop  - 1 x daily - 7 x weekly - 3 sets - 10 reps - Hip Extension with Resistance Loop  - 1 x daily - 7 x weekly - 3 sets - 10 reps - Standing Hamstring Curl with Resistance  - 1 x daily - 7 x weekly - 3 sets - 10 reps - Sit to Stand Without Arm Support  - 1 x daily - 7 x weekly - 1 sets - 10 reps - Tandem Stance with Support  - 1 x  daily - 7 x weekly - 1 sets - 3 reps -  20-30 seconds hold  ASSESSMENT:  CLINICAL IMPRESSION: Patient is a 88 y.o. female who was seen today for physical therapy evaluation and treatment for OA of bilat knees. Pt presents with decreased hip and knee strength, decreased functional activity tolerance, impaired gait and balance and will benefit from skilled PT to address deficits and improve functional mobility and reduce risk of falls.   OBJECTIVE IMPAIRMENTS: decreased activity tolerance, difficulty walking, and decreased strength.   ACTIVITY LIMITATIONS: standing, stairs, transfers, and locomotion level  PARTICIPATION LIMITATIONS: community activity  PERSONAL FACTORS: Age and 1 comorbidity: OA are also affecting patient's functional outcome.   REHAB POTENTIAL: Good  CLINICAL DECISION MAKING: Stable/uncomplicated  EVALUATION COMPLEXITY: Low   GOALS: Goals reviewed with patient? Yes  SHORT TERM GOALS: Target date: 03/11/2024   Pt will be independent with initial HEP Baseline: Goal status: INITIAL  2.  Pt will improve 5 x STS to <= 15 seconds Baseline:  Goal status: INITIAL    LONG TERM GOALS: Target date: 04/08/2024    Pt will be independent in advanced HEP Baseline:  Goal status: INITIAL  2.  Pt will improve KOOS -12 to >= 80% to demo improved functional mobility Baseline:  Goal status: INITIAL  3.  Pt will improve bilat LE strength to 4+/5 to improve standing and walking tolerance Baseline:  Goal status: INITIAL  4.  Pt will improve 3 minute walk to >= 500' to demo improved gait and activity tolerance Baseline:  Goal status: INITIAL  5.  Pt will demo improved balance by performing tandem stance x 15 seconds bilat without UE support Baseline:  Goal status: INITIAL     PLAN:  PT FREQUENCY: 2x/week  PT DURATION: 8 weeks  PLANNED INTERVENTIONS: 97164- PT Re-evaluation, 97110-Therapeutic exercises, 97530- Therapeutic activity, 97112- Neuromuscular  re-education, 97535- Self Care, 16109- Manual therapy, [id number]- Aquatic Therapy, [phone]- Ionotophoresis 4mg /ml Dexamethasone, Patient/Family education, Balance training, Stair training, Taping, Cryotherapy, and Moist heat  PLAN FOR NEXT SESSION: assess response to HEP, LE strength and endurance, balance   Verneda Hollopeter, PT 02/12/2024, 12:27 PM

## 2024-02-14 ENCOUNTER — Ambulatory Visit

## 2024-02-14 DIAGNOSIS — R531 Weakness: Secondary | ICD-10-CM

## 2024-02-14 DIAGNOSIS — M5030 Other cervical disc degeneration, unspecified cervical region: Secondary | ICD-10-CM

## 2024-02-14 DIAGNOSIS — M5459 Other low back pain: Secondary | ICD-10-CM | POA: Diagnosis not present

## 2024-02-14 DIAGNOSIS — R262 Difficulty in walking, not elsewhere classified: Secondary | ICD-10-CM | POA: Diagnosis not present

## 2024-02-14 DIAGNOSIS — M170 Bilateral primary osteoarthritis of knee: Secondary | ICD-10-CM

## 2024-02-14 DIAGNOSIS — R293 Abnormal posture: Secondary | ICD-10-CM

## 2024-02-14 NOTE — Therapy (Signed)
 OUTPATIENT PHYSICAL THERAPY LOWER EXTREMITY TREATMENT   Patient Name: Anna [name] MRN: [id number] DOB:1935/12/18, 88 y.o., female Today's Date: 02/14/2024  END OF SESSION:  PT End of Session - 02/14/24 1147     Visit Number 2    Number of Visits 16    Date for PT Re-Evaluation 04/08/24    Authorization Type [employer] Medicare    Progress Note Due on Visit 10    PT Start Time 1148    PT Stop Time 1230    PT Time Calculation (min) 42 min    Activity Tolerance Patient tolerated treatment well    Behavior During Therapy WFL for tasks assessed/performed             Past Medical History:  Diagnosis Date   Dermatitis    seborrehic scalp   Hiatal hernia    History of basal cell carcinoma 07/20/2017   On nose.    History of melanoma 07/20/2017   Right upper arm    Hyperlipidemia    Macular degeneration    Past Surgical History:  Procedure Laterality Date   BREAST CYST EXCISION Right 09/1962   CATARACT EXTRACTION  03/2015   melonoma Right 06/2016   arm   MOHS SURGERY Left 06/2016   nose   Patient Active Problem List   Diagnosis Date Noted   Bilateral knee pain 11/09/2023   Memory change 01/04/2023   Primary hypertension 02/07/2022   Anxious depression 02/07/2022   DDD (degenerative disc disease), cervical 05/04/2021   Neck pain 05/03/2021   Anorexia 06/30/2020   Umbilical hernia without obstruction and without gangrene 06/30/2020   Meralgia paresthetica of right side 06/22/2020   Venous insufficiency of both lower extremities 06/22/2020   Primary osteoarthritis, left shoulder 06/03/2020   Bilateral lower extremity edema 05/25/2020   Aortic atherosclerosis (HCC) 05/13/2020   Bunion, right foot 11/22/2019   Lichen planus 11/22/2019   Asthma in adult 11/22/2019   Mixed restrictive and obstructive lung disease (HCC) 11/22/2019   Primary osteoarthritis of both knees 11/22/2019   Acute pain of left shoulder 09/05/2019   Chronic bilateral low back pain without  sciatica 09/05/2019   OAB (overactive bladder) 08/08/2019   Insomnia 08/08/2019   History of melanoma 07/20/2017   History of basal cell carcinoma 07/20/2017   IFG (impaired fasting glucose) 07/17/2017   Macular degeneration    Chronic fatigue 08/06/2015   Gastroesophageal reflux disease without esophagitis 08/06/2015   Glaucoma 08/06/2015   Hiatal hernia 08/06/2015   Melanoma of right upper arm (HCC) 08/06/2015   Mixed hyperlipidemia 08/06/2015   Rupture of right biceps tendon 08/06/2015   Biceps tendonitis of both shoulders 08/06/2015    PCP: Greer Leak   REFERRING PROVIDER: Greer Leak  REFERRING DIAG: OA of bilat knees  THERAPY DIAG:  Primary osteoarthritis of both knees  Difficulty in walking, not elsewhere classified  Weakness generalized  DDD (degenerative disc disease), cervical  Other low back pain  Abnormal posture  Rationale for Evaluation and Treatment: Rehabilitation  ONSET DATE: April 2025  SUBJECTIVE:   SUBJECTIVE STATEMENT: Patient reports she walked up her stairs but does not go down because of instability in bilateral knees.   PERTINENT HISTORY: OA PAIN:  Are you having pain? No  PRECAUTIONS: None  RED FLAGS: None   WEIGHT BEARING RESTRICTIONS: No  FALLS:  Has patient fallen in last 6 months? No  LIVING ENVIRONMENT: Lives with: lives alone Lives in: Other independent living facility Stairs: No Has following equipment at home: None  OCCUPATION:  retired  PLOF: Independent  PATIENT GOALS: prevent falls and not have to use a RW or cane  NEXT MD VISIT: PRN  OBJECTIVE:  Note: Objective measures were completed at Evaluation unless otherwise noted.   PATIENT SURVEYS:  KOOS -12 : 70.8%   PALPATION: No TTP bilat knees  LOWER EXTREMITY ROM:  Active ROM Right eval Left eval  Hip flexion    Hip extension    Hip abduction    Hip adduction    Hip internal rotation    Hip external rotation    Knee flexion 120 120  Knee  extension 0 0  Ankle dorsiflexion    Ankle plantarflexion    Ankle inversion    Ankle eversion     (Blank rows = not tested)  LOWER EXTREMITY MMT:  MMT Right eval Left eval  Hip flexion 4- 4-  Hip extension 4- 4-  Hip abduction 4- 4-  Hip adduction    Hip internal rotation    Hip external rotation    Knee flexion 4 4  Knee extension 4 4  Ankle dorsiflexion    Ankle plantarflexion    Ankle inversion    Ankle eversion     (Blank rows = not tested)   FUNCTIONAL TESTS:  5 times sit to stand: 20.68 seconds 3 minute walk test: 414 ft total Gait speed: 2.3 ft/seconds Stair negotiation requires UE support for descending stairs due to knee instability Tandem stance - unable bilat   [facility] Adult PT Treatment:                                                DATE: 02/14/2024 Therapeutic Exercise: Collene Dawson --> cueing toes up & ball b/w knees alleviated HS cramping Standing: Side stepping + RTB crossed at ankles --> railing for UE support 5x5' Hip extension + RTB crossed at ankles 2x10 LAQ + 3#AW x10 (bil) Neuromuscular re-ed: Small range SLR 10x5" 4" step down x 10 each leg                                                                                                                               TREATMENT DATE: 02/12/24 See HEP Pt educated on PT POC and goals, rationale for treatment    PATIENT EDUCATION:  Education details: Updated HEP Person educated: Patient Education method: Explanation, Demonstration, and Handouts Education comprehension: verbalized understanding and returned demonstration  HOME EXERCISE PROGRAM: Access Code: [facility] URL: [email or web address] Date: 02/14/2024 Prepared by: Sims Duck  Exercises - Hip Abduction with Resistance Loop  - 1 x daily - 7 x weekly - 3 sets - 10 reps - Hip Extension with Resistance Loop  - 1 x daily - 7 x weekly - 3 sets - 10 reps - Standing Hamstring Curl with Resistance  - 1 x daily - 7  x weekly - 3  sets - 10 reps - Sit to Stand Without Arm Support  - 1 x daily - 7 x weekly - 1 sets - 10 reps - Tandem Stance with Support  - 1 x daily - 7 x weekly - 1 sets - 3 reps - 20-30 seconds hold - Small Range Straight Leg Raise  - 1 x daily - 7 x weekly - 3 sets - 10 reps - 5 sec hold - Side Stepping with Resistance at Ankles and Counter Support  - 1 x daily - 7 x weekly - 3 sets - 10 reps  ASSESSMENT:  CLINICAL IMPRESSION: Cueing toes up and hip add isometric stabilization with ball between knees decreased hamstring cramping during bridges. Side lying exercises deferred due to patient unable to tolerate lying on side as it exacerbated hip soreness. HEP reviewed and glute strengthening progressed as tolerated.   EVAL: Patient is a 88 y.o. female who was seen today for physical therapy evaluation and treatment for OA of bilat knees. Pt presents with decreased hip and knee strength, decreased functional activity tolerance, impaired gait and balance and will benefit from skilled PT to address deficits and improve functional mobility and reduce risk of falls.   OBJECTIVE IMPAIRMENTS: decreased activity tolerance, difficulty walking, and decreased strength.   ACTIVITY LIMITATIONS: standing, stairs, transfers, and locomotion level  PARTICIPATION LIMITATIONS: community activity  PERSONAL FACTORS: Age and 1 comorbidity: OA are also affecting patient's functional outcome.   REHAB POTENTIAL: Good  CLINICAL DECISION MAKING: Stable/uncomplicated  EVALUATION COMPLEXITY: Low   GOALS: Goals reviewed with patient? Yes  SHORT TERM GOALS: Target date: 03/11/2024  Pt will be independent with initial HEP Baseline: Goal status: INITIAL  2.  Pt will improve 5 x STS to <= 15 seconds Baseline:  Goal status: INITIAL    LONG TERM GOALS: Target date: 04/08/2024  Pt will be independent in advanced HEP Baseline:  Goal status: INITIAL  2.  Pt will improve KOOS -12 to >= 80% to demo improved functional  mobility Baseline:  Goal status: INITIAL  3.  Pt will improve bilat LE strength to 4+/5 to improve standing and walking tolerance Baseline:  Goal status: INITIAL  4.  Pt will improve 3 minute walk to >= 500' to demo improved gait and activity tolerance Baseline:  Goal status: INITIAL  5.  Pt will demo improved balance by performing tandem stance x 15 seconds bilat without UE support Baseline:  Goal status: INITIAL     PLAN:  PT FREQUENCY: 2x/week  PT DURATION: 8 weeks  PLANNED INTERVENTIONS: 97164- PT Re-evaluation, 97110-Therapeutic exercises, 97530- Therapeutic activity, 97112- Neuromuscular re-education, 97535- Self Care, 16109- Manual therapy, [id number]- Aquatic Therapy, [id number]- Ionotophoresis 4mg /ml Dexamethasone, Patient/Family education, Balance training, Stair training, Taping, Cryotherapy, and Moist heat  PLAN FOR NEXT SESSION: Progress LE strength and endurance, balance   Flint Hummer, PTA 02/14/2024, 12:34 PM

## 2024-02-20 ENCOUNTER — Encounter: Payer: Self-pay | Admitting: Physical Therapy

## 2024-02-20 ENCOUNTER — Ambulatory Visit: Admitting: Physical Therapy

## 2024-02-20 DIAGNOSIS — M170 Bilateral primary osteoarthritis of knee: Secondary | ICD-10-CM | POA: Diagnosis not present

## 2024-02-20 DIAGNOSIS — M5459 Other low back pain: Secondary | ICD-10-CM | POA: Diagnosis not present

## 2024-02-20 DIAGNOSIS — R262 Difficulty in walking, not elsewhere classified: Secondary | ICD-10-CM

## 2024-02-20 DIAGNOSIS — R531 Weakness: Secondary | ICD-10-CM

## 2024-02-20 DIAGNOSIS — R293 Abnormal posture: Secondary | ICD-10-CM | POA: Diagnosis not present

## 2024-02-20 DIAGNOSIS — M5030 Other cervical disc degeneration, unspecified cervical region: Secondary | ICD-10-CM | POA: Diagnosis not present

## 2024-02-20 NOTE — Therapy (Signed)
 OUTPATIENT PHYSICAL THERAPY LOWER EXTREMITY TREATMENT   Patient Name: Anna [name] MRN: [id number] DOB:[birth year], 88 y.o., female Today's Date: 02/20/2024  END OF SESSION:  PT End of Session - 02/20/24 1357     Visit Number 3    Number of Visits 16    Date for PT Re-Evaluation 04/08/24    Authorization Type [employer] Medicare    Authorization - Visit Number 2    Progress Note Due on Visit 10    PT Start Time 1315    PT Stop Time 1357    PT Time Calculation (min) 42 min    Activity Tolerance Patient tolerated treatment well    Behavior During Therapy WFL for tasks assessed/performed              Past Medical History:  Diagnosis Date   Dermatitis    seborrehic scalp   Hiatal hernia    History of basal cell carcinoma 07/20/2017   On nose.    History of melanoma 07/20/2017   Right upper arm    Hyperlipidemia    Macular degeneration    Past Surgical History:  Procedure Laterality Date   BREAST CYST EXCISION Right 09/1962   CATARACT EXTRACTION  03/2015   melonoma Right 06/2016   arm   MOHS SURGERY Left 06/2016   nose   Patient Active Problem List   Diagnosis Date Noted   Bilateral knee pain 11/09/2023   Memory change 01/04/2023   Primary hypertension 02/07/2022   Anxious depression 02/07/2022   DDD (degenerative disc disease), cervical 05/04/2021   Neck pain 05/03/2021   Anorexia 06/30/2020   Umbilical hernia without obstruction and without gangrene 06/30/2020   Meralgia paresthetica of right side 06/22/2020   Venous insufficiency of both lower extremities 06/22/2020   Primary osteoarthritis, left shoulder 06/03/2020   Bilateral lower extremity edema 05/25/2020   Aortic atherosclerosis (HCC) 05/13/2020   Bunion, right foot 11/22/2019   Lichen planus 11/22/2019   Asthma in adult 11/22/2019   Mixed restrictive and obstructive lung disease (HCC) 11/22/2019   Primary osteoarthritis of both knees 11/22/2019   Acute pain of left shoulder 09/05/2019   Chronic  bilateral low back pain without sciatica 09/05/2019   OAB (overactive bladder) 08/08/2019   Insomnia 08/08/2019   History of melanoma 07/20/2017   History of basal cell carcinoma 07/20/2017   IFG (impaired fasting glucose) 07/17/2017   Macular degeneration    Chronic fatigue 08/06/2015   Gastroesophageal reflux disease without esophagitis 08/06/2015   Glaucoma 08/06/2015   Hiatal hernia 08/06/2015   Melanoma of right upper arm (HCC) 08/06/2015   Mixed hyperlipidemia 08/06/2015   Rupture of right biceps tendon 08/06/2015   Biceps tendonitis of both shoulders 08/06/2015    PCP: Greer Leak   REFERRING PROVIDER: Greer Leak  REFERRING DIAG: OA of bilat knees  THERAPY DIAG:  Primary osteoarthritis of both knees  Difficulty in walking, not elsewhere classified  Weakness generalized  Rationale for Evaluation and Treatment: Rehabilitation  ONSET DATE: April 2025  SUBJECTIVE:   SUBJECTIVE STATEMENT: Patient reports she feels more sore in her LT knee today due to the rain  PERTINENT HISTORY: OA PAIN:  Are you having pain? No  PRECAUTIONS: None  RED FLAGS: None   WEIGHT BEARING RESTRICTIONS: No  FALLS:  Has patient fallen in last 6 months? No  LIVING ENVIRONMENT: Lives with: lives alone Lives in: Other independent living facility Stairs: No Has following equipment at home: None  OCCUPATION: retired  PLOF: Independent  PATIENT GOALS: prevent falls  and not have to use a RW or cane  NEXT MD VISIT: PRN  OBJECTIVE:  Note: Objective measures were completed at Evaluation unless otherwise noted.   PATIENT SURVEYS:  KOOS -12 : 70.8%   PALPATION: No TTP bilat knees  LOWER EXTREMITY ROM:  Active ROM Right eval Left eval  Hip flexion    Hip extension    Hip abduction    Hip adduction    Hip internal rotation    Hip external rotation    Knee flexion 120 120  Knee extension 0 0  Ankle dorsiflexion    Ankle plantarflexion    Ankle inversion    Ankle  eversion     (Blank rows = not tested)  LOWER EXTREMITY MMT:  MMT Right eval Left eval  Hip flexion 4- 4-  Hip extension 4- 4-  Hip abduction 4- 4-  Hip adduction    Hip internal rotation    Hip external rotation    Knee flexion 4 4  Knee extension 4 4  Ankle dorsiflexion    Ankle plantarflexion    Ankle inversion    Ankle eversion     (Blank rows = not tested)   FUNCTIONAL TESTS:  5 times sit to stand: 20.68 seconds 3 minute walk test: 414 ft total Gait speed: 2.3 ft/seconds Stair negotiation requires UE support for descending stairs due to knee instability Tandem stance - unable bilat   [facility] Adult PT Treatment:                                                DATE: 02/20/24 Therapeutic Exercise/Activity/NMR: Nustep L6 x 5 min for warm up supine Small range SLR  x 10 Seated Sit <> stand without UE support x 10 LAQ 3# with hip add ball squeeze x 10 Hip abd red TB x 15 Standing Tandem stance 2 x 30 sec with intermittent UE support Hip abd red TB 2 x 10 Hip ext red TB 2 x 10 HS curl red TB 2 x 10 3 minute walk for endurance training   [facility] Adult PT Treatment:                                                DATE: 02/14/2024 Therapeutic Exercise: Collene Dawson --> cueing toes up & ball b/w knees alleviated HS cramping Standing: Side stepping + RTB crossed at ankles --> railing for UE support 5x5' Hip extension + RTB crossed at ankles 2x10 LAQ + 3#AW x10 (bil) Neuromuscular re-ed: Small range SLR 10x5" 4" step down x 10 each leg  TREATMENT DATE: 02/12/24 See HEP Pt educated on PT POC and goals, rationale for treatment    PATIENT EDUCATION:  Education details: Updated HEP Person educated: Patient Education method: Explanation, Demonstration, and Handouts Education comprehension: verbalized understanding and returned demonstration  HOME  EXERCISE PROGRAM: Access Code: [facility] URL: [email or web address] Date: 02/14/2024 Prepared by: Sims Duck  Exercises - Hip Abduction with Resistance Loop  - 1 x daily - 7 x weekly - 3 sets - 10 reps - Hip Extension with Resistance Loop  - 1 x daily - 7 x weekly - 3 sets - 10 reps - Standing Hamstring Curl with Resistance  - 1 x daily - 7 x weekly - 3 sets - 10 reps - Sit to Stand Without Arm Support  - 1 x daily - 7 x weekly - 1 sets - 10 reps - Tandem Stance with Support  - 1 x daily - 7 x weekly - 1 sets - 3 reps - 20-30 seconds hold - Small Range Straight Leg Raise  - 1 x daily - 7 x weekly - 3 sets - 10 reps - 5 sec hold - Side Stepping with Resistance at Ankles and Counter Support  - 1 x daily - 7 x weekly - 3 sets - 10 reps  ASSESSMENT:  CLINICAL IMPRESSION: Pt requires seated rest during standing exercises due to fatigue. She does improve performance throughout session with repetition and cuing for technique. She reports she has less stiffness at end of session  EVAL: Patient is a 88 y.o. female who was seen today for physical therapy evaluation and treatment for OA of bilat knees. Pt presents with decreased hip and knee strength, decreased functional activity tolerance, impaired gait and balance and will benefit from skilled PT to address deficits and improve functional mobility and reduce risk of falls.   OBJECTIVE IMPAIRMENTS: decreased activity tolerance, difficulty walking, and decreased strength.      GOALS: Goals reviewed with patient? Yes  SHORT TERM GOALS: Target date: 03/11/2024  Pt will be independent with initial HEP Baseline: Goal status: INITIAL  2.  Pt will improve 5 x STS to <= 15 seconds Baseline:  Goal status: INITIAL    LONG TERM GOALS: Target date: 04/08/2024  Pt will be independent in advanced HEP Baseline:  Goal status: INITIAL  2.  Pt will improve KOOS -12 to >= 80% to demo improved functional mobility Baseline:  Goal  status: INITIAL  3.  Pt will improve bilat LE strength to 4+/5 to improve standing and walking tolerance Baseline:  Goal status: INITIAL  4.  Pt will improve 3 minute walk to >= 500' to demo improved gait and activity tolerance Baseline:  Goal status: INITIAL  5.  Pt will demo improved balance by performing tandem stance x 15 seconds bilat without UE support Baseline:  Goal status: INITIAL     PLAN:  PT FREQUENCY: 2x/week  PT DURATION: 8 weeks  PLANNED INTERVENTIONS: 97164- PT Re-evaluation, 97110-Therapeutic exercises, 97530- Therapeutic activity, 97112- Neuromuscular re-education, 97535- Self Care, 16109- Manual therapy, [id number]- Aquatic Therapy, [phone]- Ionotophoresis 4mg /ml Dexamethasone, Patient/Family education, Balance training, Stair training, Taping, Cryotherapy, and Moist heat  PLAN FOR NEXT SESSION: Progress LE strength and endurance, balance   Aileene Lanum, PT 02/20/2024, 1:58 PM

## 2024-02-22 DIAGNOSIS — H353231 Exudative age-related macular degeneration, bilateral, with active choroidal neovascularization: Secondary | ICD-10-CM | POA: Diagnosis not present

## 2024-02-27 ENCOUNTER — Ambulatory Visit: Payer: Self-pay | Attending: Family Medicine | Admitting: Physical Therapy

## 2024-02-27 ENCOUNTER — Encounter: Payer: Self-pay | Admitting: Physical Therapy

## 2024-02-27 DIAGNOSIS — M170 Bilateral primary osteoarthritis of knee: Secondary | ICD-10-CM | POA: Diagnosis not present

## 2024-02-27 DIAGNOSIS — R531 Weakness: Secondary | ICD-10-CM | POA: Diagnosis not present

## 2024-02-27 DIAGNOSIS — R262 Difficulty in walking, not elsewhere classified: Secondary | ICD-10-CM | POA: Diagnosis not present

## 2024-02-27 NOTE — Therapy (Signed)
 OUTPATIENT PHYSICAL THERAPY LOWER EXTREMITY TREATMENT   Patient Name: Anna [name] MRN: [id number] DOB:[birth year], 88 y.o., female Today's Date: 02/27/2024  END OF SESSION:  PT End of Session - 02/27/24 1354     Visit Number 4    Number of Visits 16    Date for PT Re-Evaluation 04/08/24    Authorization Type [employer] Medicare    Authorization - Visit Number 4    Progress Note Due on Visit 10    PT Start Time 1315    PT Stop Time 1356    PT Time Calculation (min) 41 min    Activity Tolerance Patient tolerated treatment well    Behavior During Therapy WFL for tasks assessed/performed               Past Medical History:  Diagnosis Date   Dermatitis    seborrehic scalp   Hiatal hernia    History of basal cell carcinoma 07/20/2017   On nose.    History of melanoma 07/20/2017   Right upper arm    Hyperlipidemia    Macular degeneration    Past Surgical History:  Procedure Laterality Date   BREAST CYST EXCISION Right 09/1962   CATARACT EXTRACTION  03/2015   melonoma Right 06/2016   arm   MOHS SURGERY Left 06/2016   nose   Patient Active Problem List   Diagnosis Date Noted   Bilateral knee pain 11/09/2023   Memory change 01/04/2023   Primary hypertension 02/07/2022   Anxious depression 02/07/2022   DDD (degenerative disc disease), cervical 05/04/2021   Neck pain 05/03/2021   Anorexia 06/30/2020   Umbilical hernia without obstruction and without gangrene 06/30/2020   Meralgia paresthetica of right side 06/22/2020   Venous insufficiency of both lower extremities 06/22/2020   Primary osteoarthritis, left shoulder 06/03/2020   Bilateral lower extremity edema 05/25/2020   Aortic atherosclerosis (HCC) 05/13/2020   Bunion, right foot 11/22/2019   Lichen planus 11/22/2019   Asthma in adult 11/22/2019   Mixed restrictive and obstructive lung disease (HCC) 11/22/2019   Primary osteoarthritis of both knees 11/22/2019   Acute pain of left shoulder 09/05/2019   Chronic  bilateral low back pain without sciatica 09/05/2019   OAB (overactive bladder) 08/08/2019   Insomnia 08/08/2019   History of melanoma 07/20/2017   History of basal cell carcinoma 07/20/2017   IFG (impaired fasting glucose) 07/17/2017   Macular degeneration    Chronic fatigue 08/06/2015   Gastroesophageal reflux disease without esophagitis 08/06/2015   Glaucoma 08/06/2015   Hiatal hernia 08/06/2015   Melanoma of right upper arm (HCC) 08/06/2015   Mixed hyperlipidemia 08/06/2015   Rupture of right biceps tendon 08/06/2015   Biceps tendonitis of both shoulders 08/06/2015    PCP: Greer Leak   REFERRING PROVIDER: Greer Leak  REFERRING DIAG: OA of bilat knees  THERAPY DIAG:  Primary osteoarthritis of both knees  Difficulty in walking, not elsewhere classified  Weakness generalized  Rationale for Evaluation and Treatment: Rehabilitation  ONSET DATE: April 2025  SUBJECTIVE:   SUBJECTIVE STATEMENT: Patient reports she has been doing stairs after lunch and dinner every day. She still feels that her Rt knee is buckling sometimes  PERTINENT HISTORY: OA PAIN:  Are you having pain? No  PRECAUTIONS: None  RED FLAGS: None   WEIGHT BEARING RESTRICTIONS: No  FALLS:  Has patient fallen in last 6 months? No  LIVING ENVIRONMENT: Lives with: lives alone Lives in: Other independent living facility Stairs: No Has following equipment at home: None  OCCUPATION:  retired  PLOF: Independent  PATIENT GOALS: prevent falls and not have to use a RW or cane  NEXT MD VISIT: PRN  OBJECTIVE:  Note: Objective measures were completed at Evaluation unless otherwise noted.   PATIENT SURVEYS:  KOOS -12 : 70.8%   PALPATION: No TTP bilat knees  LOWER EXTREMITY ROM:  Active ROM Right eval Left eval  Hip flexion    Hip extension    Hip abduction    Hip adduction    Hip internal rotation    Hip external rotation    Knee flexion 120 120  Knee extension 0 0  Ankle dorsiflexion     Ankle plantarflexion    Ankle inversion    Ankle eversion     (Blank rows = not tested)  LOWER EXTREMITY MMT:  MMT Right eval Left eval  Hip flexion 4- 4-  Hip extension 4- 4-  Hip abduction 4- 4-  Hip adduction    Hip internal rotation    Hip external rotation    Knee flexion 4 4  Knee extension 4 4  Ankle dorsiflexion    Ankle plantarflexion    Ankle inversion    Ankle eversion     (Blank rows = not tested)   FUNCTIONAL TESTS:  5 times sit to stand: 20.68 seconds 3 minute walk test: 414 ft total Gait speed: 2.3 ft/seconds Stair negotiation requires UE support for descending stairs due to knee instability Tandem stance - unable bilat   [facility] Adult PT Treatment:                                                DATE: 02/27/24 Therapeutic Exercise/Activity/NMR: Nustep L6 x 5 min for warm up Standing Side step at counter red TB Backward/fwd walking at counter red TB Tandem stance 2 x 30 sec bilat (1 UE support) Standing on foam: head turns, head nods, EC Heel/toe raises 2 x 10 Sitting SLR over yoga block Sit <> stand without UE support x 10 LAQ 3# with hip add ball squeeze x 10 Sit <> stand without UE support x 10   [facility] Adult PT Treatment:                                                DATE: 02/20/24 Therapeutic Exercise/Activity/NMR: Nustep L6 x 5 min for warm up supine Small range SLR  x 10 Seated Sit <> stand without UE support x 10 LAQ 3# with hip add ball squeeze x 10 Hip abd red TB x 15 Standing Tandem stance 2 x 30 sec with intermittent UE support Hip abd red TB 2 x 10 Hip ext red TB 2 x 10 HS curl red TB 2 x 10 3 minute walk for endurance training   [facility] Adult PT Treatment:                                                DATE: 02/14/2024 Therapeutic Exercise: Collene Dawson --> cueing toes up & ball b/w knees alleviated HS cramping Standing: Side stepping + RTB crossed at ankles --> railing for UE support 5x5'  Hip extension + RTB crossed at ankles  2x10 LAQ + 3#AW x10 (bil) Neuromuscular re-ed: Small range SLR 10x5" 4" step down x 10 each leg    PATIENT EDUCATION:  Education details: Updated HEP Person educated: Patient Education method: Explanation, Demonstration, and Handouts Education comprehension: verbalized understanding and returned demonstration  HOME EXERCISE PROGRAM: Access Code: [facility] URL: [email or web address] Date: 02/14/2024 Prepared by: Sims Duck  Exercises - Hip Abduction with Resistance Loop  - 1 x daily - 7 x weekly - 3 sets - 10 reps - Hip Extension with Resistance Loop  - 1 x daily - 7 x weekly - 3 sets - 10 reps - Standing Hamstring Curl with Resistance  - 1 x daily - 7 x weekly - 3 sets - 10 reps - Sit to Stand Without Arm Support  - 1 x daily - 7 x weekly - 1 sets - 10 reps - Tandem Stance with Support  - 1 x daily - 7 x weekly - 1 sets - 3 reps - 20-30 seconds hold - Small Range Straight Leg Raise  - 1 x daily - 7 x weekly - 3 sets - 10 reps - 5 sec hold - Side Stepping with Resistance at Ankles and Counter Support  - 1 x daily - 7 x weekly - 3 sets - 10 reps  ASSESSMENT:  CLINICAL IMPRESSION: Pt continues to require seated rest breaks throughout session. She is improving tolerance to balance exercises. Added ankle strengthening to continue to progress strength and balance  EVAL: Patient is a 88 y.o. female who was seen today for physical therapy evaluation and treatment for OA of bilat knees. Pt presents with decreased hip and knee strength, decreased functional activity tolerance, impaired gait and balance and will benefit from skilled PT to address deficits and improve functional mobility and reduce risk of falls.   OBJECTIVE IMPAIRMENTS: decreased activity tolerance, difficulty walking, and decreased strength.      GOALS: Goals reviewed with patient? Yes  SHORT TERM GOALS: Target date: 03/11/2024  Pt will be independent with initial HEP Baseline: Goal status:  INITIAL  2.  Pt will improve 5 x STS to <= 15 seconds Baseline:  Goal status: INITIAL    LONG TERM GOALS: Target date: 04/08/2024  Pt will be independent in advanced HEP Baseline:  Goal status: INITIAL  2.  Pt will improve KOOS -12 to >= 80% to demo improved functional mobility Baseline:  Goal status: INITIAL  3.  Pt will improve bilat LE strength to 4+/5 to improve standing and walking tolerance Baseline:  Goal status: INITIAL  4.  Pt will improve 3 minute walk to >= 500' to demo improved gait and activity tolerance Baseline:  Goal status: INITIAL  5.  Pt will demo improved balance by performing tandem stance x 15 seconds bilat without UE support Baseline:  Goal status: INITIAL     PLAN:  PT FREQUENCY: 2x/week  PT DURATION: 8 weeks  PLANNED INTERVENTIONS: 97164- PT Re-evaluation, 97110-Therapeutic exercises, 97530- Therapeutic activity, 97112- Neuromuscular re-education, 97535- Self Care, 96045- Manual therapy, [id number]- Aquatic Therapy, [phone]- Ionotophoresis 4mg /ml Dexamethasone, Patient/Family education, Balance training, Stair training, Taping, Cryotherapy, and Moist heat  PLAN FOR NEXT SESSION: Progress LE strength and endurance, balance   Nancyann Cotterman, PT 02/27/2024, 1:56 PM

## 2024-02-29 ENCOUNTER — Ambulatory Visit

## 2024-03-05 ENCOUNTER — Ambulatory Visit: Admitting: Physical Therapy

## 2024-03-05 ENCOUNTER — Encounter: Payer: Self-pay | Admitting: Physical Therapy

## 2024-03-05 DIAGNOSIS — R531 Weakness: Secondary | ICD-10-CM | POA: Diagnosis not present

## 2024-03-05 DIAGNOSIS — R262 Difficulty in walking, not elsewhere classified: Secondary | ICD-10-CM

## 2024-03-05 DIAGNOSIS — M170 Bilateral primary osteoarthritis of knee: Secondary | ICD-10-CM

## 2024-03-05 NOTE — Therapy (Signed)
 OUTPATIENT PHYSICAL THERAPY LOWER EXTREMITY TREATMENT   Patient Name: Anna [name] MRN: [id number] DOB:1936-09-14, 88 y.o., female Today's Date: 03/05/2024  END OF SESSION:  PT End of Session - 03/05/24 1653     Visit Number 5    Number of Visits 16    Date for PT Re-Evaluation 04/08/24    Authorization Type [employer]    Authorization - Visit Number 5    Progress Note Due on Visit 10    PT Start Time 1615    PT Stop Time 1653    PT Time Calculation (min) 38 min    Activity Tolerance Patient tolerated treatment well    Behavior During Therapy WFL for tasks assessed/performed                Past Medical History:  Diagnosis Date   Dermatitis    seborrehic scalp   Hiatal hernia    History of basal cell carcinoma 07/20/2017   On nose.    History of melanoma 07/20/2017   Right upper arm    Hyperlipidemia    Macular degeneration    Past Surgical History:  Procedure Laterality Date   BREAST CYST EXCISION Right 09/1962   CATARACT EXTRACTION  03/2015   melonoma Right 06/2016   arm   MOHS SURGERY Left 06/2016   nose   Patient Active Problem List   Diagnosis Date Noted   Bilateral knee pain 11/09/2023   Memory change 01/04/2023   Primary hypertension 02/07/2022   Anxious depression 02/07/2022   DDD (degenerative disc disease), cervical 05/04/2021   Neck pain 05/03/2021   Anorexia 06/30/2020   Umbilical hernia without obstruction and without gangrene 06/30/2020   Meralgia paresthetica of right side 06/22/2020   Venous insufficiency of both lower extremities 06/22/2020   Primary osteoarthritis, left shoulder 06/03/2020   Bilateral lower extremity edema 05/25/2020   Aortic atherosclerosis (HCC) 05/13/2020   Bunion, right foot 11/22/2019   Lichen planus 11/22/2019   Asthma in adult 11/22/2019   Mixed restrictive and obstructive lung disease (HCC) 11/22/2019   Primary osteoarthritis of both knees 11/22/2019   Acute pain of left shoulder 09/05/2019    Chronic bilateral low back pain without sciatica 09/05/2019   OAB (overactive bladder) 08/08/2019   Insomnia 08/08/2019   History of melanoma 07/20/2017   History of basal cell carcinoma 07/20/2017   IFG (impaired fasting glucose) 07/17/2017   Macular degeneration    Chronic fatigue 08/06/2015   Gastroesophageal reflux disease without esophagitis 08/06/2015   Glaucoma 08/06/2015   Hiatal hernia 08/06/2015   Melanoma of right upper arm (HCC) 08/06/2015   Mixed hyperlipidemia 08/06/2015   Rupture of right biceps tendon 08/06/2015   Biceps tendonitis of both shoulders 08/06/2015    PCP: Greer Leak   REFERRING PROVIDER: Greer Leak  REFERRING DIAG: OA of bilat knees  THERAPY DIAG:  Primary osteoarthritis of both knees  Difficulty in walking, not elsewhere classified  Weakness generalized  Rationale for Evaluation and Treatment: Rehabilitation  ONSET DATE: April 2025  SUBJECTIVE:   SUBJECTIVE STATEMENT: Patient reports no new complaints. She wants to prevent falls  PERTINENT HISTORY: OA PAIN:  Are you having pain? No  PRECAUTIONS: None  RED FLAGS: None   WEIGHT BEARING RESTRICTIONS: No  FALLS:  Has patient fallen in last 6 months? No  LIVING ENVIRONMENT: Lives with: lives alone Lives in: Other independent living facility Stairs: No Has following equipment at home: None  OCCUPATION: retired  PLOF: Independent  PATIENT GOALS: prevent falls and not have  to use a RW or cane  NEXT MD VISIT: PRN  OBJECTIVE:  Note: Objective measures were completed at Evaluation unless otherwise noted.   PATIENT SURVEYS:  KOOS -12 : 70.8%   PALPATION: No TTP bilat knees  LOWER EXTREMITY ROM:  Active ROM Right eval Left eval  Hip flexion    Hip extension    Hip abduction    Hip adduction    Hip internal rotation    Hip external rotation    Knee flexion 120 120  Knee extension 0 0  Ankle dorsiflexion    Ankle plantarflexion    Ankle inversion    Ankle  eversion     (Blank rows = not tested)  LOWER EXTREMITY MMT:  MMT Right eval Left eval  Hip flexion 4- 4-  Hip extension 4- 4-  Hip abduction 4- 4-  Hip adduction    Hip internal rotation    Hip external rotation    Knee flexion 4 4  Knee extension 4 4  Ankle dorsiflexion    Ankle plantarflexion    Ankle inversion    Ankle eversion     (Blank rows = not tested)   FUNCTIONAL TESTS:  5 times sit to stand: 20.68 seconds 3 minute walk test: 414 ft total Gait speed: 2.3 ft/seconds Stair negotiation requires UE support for descending stairs due to knee instability Tandem stance - unable bilat   [facility] Adult PT Treatment:                                                DATE: 03/05/24 Therapeutic Exercise/Activity Willie Harry: Standing Side step at counter red TB Fwd/bkwd walking at counter red TB Tandem stance on foam 2 x 30 sec - light 1 UE support Standing on foam: head turns, head nods, EC Suitcase carry 5# KB x 2 laps 3 minute walk for endurance training Sitting Sit <> stand 5# KB x 10 SLR over yoga block x 10 bilat LAQ 3# with hip add ball squeeze 2 x 10   [facility] Adult PT Treatment:                                                DATE: 02/27/24 Therapeutic Exercise/Activity/NMR: Nustep L6 x 5 min for warm up Standing Side step at counter red TB Backward/fwd walking at counter red TB Tandem stance 2 x 30 sec bilat (1 UE support) Standing on foam: head turns, head nods, EC Heel/toe raises 2 x 10 Sitting SLR over yoga block Sit <> stand without UE support x 10 LAQ 3# with hip add ball squeeze x 10 Sit <> stand without UE support x 10   [facility] Adult PT Treatment:                                                DATE: 02/20/24 Therapeutic Exercise/Activity/NMR: Nustep L6 x 5 min for warm up supine Small range SLR  x 10 Seated Sit <> stand without UE support x 10 LAQ 3# with hip add ball squeeze x 10 Hip abd red TB x 15 Standing Tandem stance  2 x 30 sec with intermittent UE  support Hip abd red TB 2 x 10 Hip ext red TB 2 x 10 HS curl red TB 2 x 10 3 minute walk for endurance training   PATIENT EDUCATION:  Education details: Updated HEP Person educated: Patient Education method: Explanation, Demonstration, and Handouts Education comprehension: verbalized understanding and returned demonstration  HOME EXERCISE PROGRAM: Access Code: [facility] URL: [email or web address] Date: 02/14/2024 Prepared by: Sims Duck  Exercises - Hip Abduction with Resistance Loop  - 1 x daily - 7 x weekly - 3 sets - 10 reps - Hip Extension with Resistance Loop  - 1 x daily - 7 x weekly - 3 sets - 10 reps - Standing Hamstring Curl with Resistance  - 1 x daily - 7 x weekly - 3 sets - 10 reps - Sit to Stand Without Arm Support  - 1 x daily - 7 x weekly - 1 sets - 10 reps - Tandem Stance with Support  - 1 x daily - 7 x weekly - 1 sets - 3 reps - 20-30 seconds hold - Small Range Straight Leg Raise  - 1 x daily - 7 x weekly - 3 sets - 10 reps - 5 sec hold - Side Stepping with Resistance at Ankles and Counter Support  - 1 x daily - 7 x weekly - 3 sets - 10 reps  ASSESSMENT:  CLINICAL IMPRESSION: Continued to progress balance with pt improving performance on airex foam today. Plan to add more dynamic balance next visit  EVAL: Patient is a 88 y.o. female who was seen today for physical therapy evaluation and treatment for OA of bilat knees. Pt presents with decreased hip and knee strength, decreased functional activity tolerance, impaired gait and balance and will benefit from skilled PT to address deficits and improve functional mobility and reduce risk of falls.   OBJECTIVE IMPAIRMENTS: decreased activity tolerance, difficulty walking, and decreased strength.      GOALS: Goals reviewed with patient? Yes  SHORT TERM GOALS: Target date: 03/11/2024  Pt will be independent with initial HEP Baseline: Goal status: INITIAL  2.  Pt will improve 5 x STS to <= 15  seconds Baseline:  Goal status: INITIAL    LONG TERM GOALS: Target date: 04/08/2024  Pt will be independent in advanced HEP Baseline:  Goal status: INITIAL  2.  Pt will improve KOOS -12 to >= 80% to demo improved functional mobility Baseline:  Goal status: INITIAL  3.  Pt will improve bilat LE strength to 4+/5 to improve standing and walking tolerance Baseline:  Goal status: INITIAL  4.  Pt will improve 3 minute walk to >= 500' to demo improved gait and activity tolerance Baseline:  Goal status: INITIAL  5.  Pt will demo improved balance by performing tandem stance x 15 seconds bilat without UE support Baseline:  Goal status: INITIAL     PLAN:  PT FREQUENCY: 2x/week  PT DURATION: 8 weeks  PLANNED INTERVENTIONS: 97164- PT Re-evaluation, 97110-Therapeutic exercises, 97530- Therapeutic activity, 97112- Neuromuscular re-education, 97535- Self Care, 29562- Manual therapy, [id number]- Aquatic Therapy, [phone]- Ionotophoresis 4mg /ml Dexamethasone, Patient/Family education, Balance training, Stair training, Taping, Cryotherapy, and Moist heat  PLAN FOR NEXT SESSION: CHECK STG's, Add dynamic balance Progress LE strength and endurance, balance   Yochanan Eddleman, PT 03/05/2024, 4:53 PM

## 2024-03-12 ENCOUNTER — Ambulatory Visit: Admitting: Physical Therapy

## 2024-03-12 ENCOUNTER — Encounter: Payer: Self-pay | Admitting: Physical Therapy

## 2024-03-12 DIAGNOSIS — M170 Bilateral primary osteoarthritis of knee: Secondary | ICD-10-CM

## 2024-03-12 DIAGNOSIS — R262 Difficulty in walking, not elsewhere classified: Secondary | ICD-10-CM | POA: Diagnosis not present

## 2024-03-12 DIAGNOSIS — R531 Weakness: Secondary | ICD-10-CM | POA: Diagnosis not present

## 2024-03-12 NOTE — Therapy (Signed)
 OUTPATIENT PHYSICAL THERAPY LOWER EXTREMITY TREATMENT   Patient Name: Anna [name] MRN: [id number] DOB:Oct 04, 1935, 88 y.o., female Today's Date: 03/12/2024  END OF SESSION:  PT End of Session - 03/12/24 1608     Visit Number 6    Number of Visits 16    Date for PT Re-Evaluation 04/08/24    Authorization Type [employer] Medicare    Authorization - Visit Number 6    Progress Note Due on Visit 10    PT Start Time 1530    PT Stop Time 1609    PT Time Calculation (min) 39 min    Activity Tolerance Patient tolerated treatment well    Behavior During Therapy WFL for tasks assessed/performed              Past Medical History:  Diagnosis Date   Dermatitis    seborrehic scalp   Hiatal hernia    History of basal cell carcinoma 07/20/2017   On nose.    History of melanoma 07/20/2017   Right upper arm    Hyperlipidemia    Macular degeneration    Past Surgical History:  Procedure Laterality Date   BREAST CYST EXCISION Right 09/1962   CATARACT EXTRACTION  03/2015   melonoma Right 06/2016   arm   MOHS SURGERY Left 06/2016   nose   Patient Active Problem List   Diagnosis Date Noted   Bilateral knee pain 11/09/2023   Memory change 01/04/2023   Primary hypertension 02/07/2022   Anxious depression 02/07/2022   DDD (degenerative disc disease), cervical 05/04/2021   Neck pain 05/03/2021   Anorexia 06/30/2020   Umbilical hernia without obstruction and without gangrene 06/30/2020   Meralgia paresthetica of right side 06/22/2020   Venous insufficiency of both lower extremities 06/22/2020   Primary osteoarthritis, left shoulder 06/03/2020   Bilateral lower extremity edema 05/25/2020   Aortic atherosclerosis (HCC) 05/13/2020   Bunion, right foot 11/22/2019   Lichen planus 11/22/2019   Asthma in adult 11/22/2019   Mixed restrictive and obstructive lung disease (HCC) 11/22/2019   Primary osteoarthritis of both knees 11/22/2019   Acute pain of left shoulder 09/05/2019   Chronic  bilateral low back pain without sciatica 09/05/2019   OAB (overactive bladder) 08/08/2019   Insomnia 08/08/2019   History of melanoma 07/20/2017   History of basal cell carcinoma 07/20/2017   IFG (impaired fasting glucose) 07/17/2017   Macular degeneration    Chronic fatigue 08/06/2015   Gastroesophageal reflux disease without esophagitis 08/06/2015   Glaucoma 08/06/2015   Hiatal hernia 08/06/2015   Melanoma of right upper arm (HCC) 08/06/2015   Mixed hyperlipidemia 08/06/2015   Rupture of right biceps tendon 08/06/2015   Biceps tendonitis of both shoulders 08/06/2015    PCP: Greer Leak   REFERRING PROVIDER: Greer Leak  REFERRING DIAG: OA of bilat knees  THERAPY DIAG:  Primary osteoarthritis of both knees  Difficulty in walking, not elsewhere classified  Weakness generalized  Rationale for Evaluation and Treatment: Rehabilitation  ONSET DATE: April 2025  SUBJECTIVE:   SUBJECTIVE STATEMENT: Patient reports she has been walking more and has not felt like her legs will buckle. She states she has soreness on Rt inner thigh and calf when sitting in figure 4 position  PERTINENT HISTORY: OA PAIN:  Are you having pain? No  PRECAUTIONS: None  RED FLAGS: None   WEIGHT BEARING RESTRICTIONS: No  FALLS:  Has patient fallen in last 6 months? No  LIVING ENVIRONMENT: Lives with: lives alone Lives in: Other independent living facility Stairs:  No Has following equipment at home: None  OCCUPATION: retired  PLOF: Independent  PATIENT GOALS: prevent falls and not have to use a RW or cane  NEXT MD VISIT: PRN  OBJECTIVE:  Note: Objective measures were completed at Evaluation unless otherwise noted.   PATIENT SURVEYS:  KOOS -12 : 70.8%   PALPATION: No TTP bilat knees  LOWER EXTREMITY ROM:  Active ROM Right eval Left eval  Hip flexion    Hip extension    Hip abduction    Hip adduction    Hip internal rotation    Hip external rotation    Knee flexion 120  120  Knee extension 0 0  Ankle dorsiflexion    Ankle plantarflexion    Ankle inversion    Ankle eversion     (Blank rows = not tested)  LOWER EXTREMITY MMT:  MMT Right eval Left eval  Hip flexion 4- 4-  Hip extension 4- 4-  Hip abduction 4- 4-  Hip adduction    Hip internal rotation    Hip external rotation    Knee flexion 4 4  Knee extension 4 4  Ankle dorsiflexion    Ankle plantarflexion    Ankle inversion    Ankle eversion     (Blank rows = not tested)   FUNCTIONAL TESTS:  Eval:5 times sit to stand: 20.68 seconds 3 minute walk test: 414 ft total 5 x STS (03/12/24): 17.97 seconds 3 minute walk test: 446 ft total Gait speed: 2.3 ft/seconds Stair negotiation requires UE support for descending stairs due to knee instability Tandem stance - unable bilat   [facility] Adult PT Treatment:                                                DATE: 03/12/24 Therapeutic Exercise/Activity/NMR: 5 x STS 17.97 seconds 3 min walk test: 446 feet Standing tapping different color dots with occasional CGA 180 degree turns with intermittent HHA Gait with ball toss to self - CGA/Min A Tandem walking at counter fwd/bkwd Walking with head turns/nods - min A Adductor lunge stretch at counter   [facility] Adult PT Treatment:                                                DATE: 03/05/24 Therapeutic Exercise/Activity Willie Harry: Standing Side step at counter red TB Fwd/bkwd walking at counter red TB Tandem stance on foam 2 x 30 sec - light 1 UE support Standing on foam: head turns, head nods, EC Suitcase carry 5# KB x 2 laps 3 minute walk for endurance training Sitting Sit <> stand 5# KB x 10 SLR over yoga block x 10 bilat LAQ 3# with hip add ball squeeze 2 x 10   [facility] Adult PT Treatment:                                                DATE: 02/27/24 Therapeutic Exercise/Activity/NMR: Nustep L6 x 5 min for warm up Standing Side step at counter red TB Backward/fwd walking at counter red TB Tandem  stance 2 x 30 sec bilat (1 UE  support) Standing on foam: head turns, head nods, EC Heel/toe raises 2 x 10 Sitting SLR over yoga block Sit <> stand without UE support x 10 LAQ 3# with hip add ball squeeze x 10 Sit <> stand without UE support x 10      PATIENT EDUCATION:  Education details: Updated HEP Person educated: Patient Education method: Explanation, Demonstration, and Handouts Education comprehension: verbalized understanding and returned demonstration  HOME EXERCISE PROGRAM: Access Code: [facility] URL: [email or web address] Date: 03/12/2024 Prepared by: Lowery Rue  Exercises - Hip Abduction with Resistance Loop  - 1 x daily - 7 x weekly - 3 sets - 10 reps - Hip Extension with Resistance Loop  - 1 x daily - 7 x weekly - 3 sets - 10 reps - Standing Hamstring Curl with Resistance  - 1 x daily - 7 x weekly - 3 sets - 10 reps - Sit to Stand Without Arm Support  - 1 x daily - 7 x weekly - 1 sets - 10 reps - Tandem Stance with Support  - 1 x daily - 7 x weekly - 1 sets - 3 reps - 20-30 seconds hold - Small Range Straight Leg Raise  - 1 x daily - 7 x weekly - 3 sets - 10 reps - 5 sec hold - Side Stepping with Resistance at Ankles and Counter Support  - 1 x daily - 7 x weekly - 3 sets - 10 reps - 360 Degree Turn in Both Directions  - 1 x daily - 7 x weekly - 1 sets - 10 reps - Star Taps  - 1 x daily - 7 x weekly - 2 sets - 10 reps  ASSESSMENT:  CLINICAL IMPRESSION: Pt has improved 5 x STS and 3 min walk test but has not met goals. She is improving gait and balance and will continue to benefit from skilled PT to improve strength and balance and reduce risk of falls. She has difficulty with gait with head turns and ball toss, requiring min A to prevent falls. Updated HEP to add balance exercises   EVAL: Patient is a 88 y.o. female who was seen today for physical therapy evaluation and treatment for OA of bilat knees. Pt presents with decreased hip and knee  strength, decreased functional activity tolerance, impaired gait and balance and will benefit from skilled PT to address deficits and improve functional mobility and reduce risk of falls.   OBJECTIVE IMPAIRMENTS: decreased activity tolerance, difficulty walking, and decreased strength.      GOALS: Goals reviewed with patient? Yes  SHORT TERM GOALS: Target date: 03/11/2024  Pt will be independent with initial HEP Baseline: Goal status: MET  2.  Pt will improve 5 x STS to <= 15 seconds Baseline:  Goal status:  IN PROGRESS    LONG TERM GOALS: Target date: 04/08/2024  Pt will be independent in advanced HEP Baseline:  Goal status: INITIAL  2.  Pt will improve KOOS -12 to >= 80% to demo improved functional mobility Baseline:  Goal status: INITIAL  3.  Pt will improve bilat LE strength to 4+/5 to improve standing and walking tolerance Baseline:  Goal status: INITIAL  4.  Pt will improve 3 minute walk to >= 500' to demo improved gait and activity tolerance Baseline:  Goal status: IN PROGRESS  5.  Pt will demo improved balance by performing tandem stance x 15 seconds bilat without UE support Baseline:  Goal status: INITIAL     PLAN:  PT FREQUENCY: 2x/week  PT DURATION: 8 weeks  PLANNED INTERVENTIONS: 97164- PT Re-evaluation, 97110-Therapeutic exercises, 97530- Therapeutic activity, [id number]- Neuromuscular re-education, 97535- Self Care, 91478- Manual therapy, [phone]- Aquatic Therapy, [phone]- Ionotophoresis 4mg /ml Dexamethasone, Patient/Family education, Balance training, Stair training, Taping, Cryotherapy, and Moist heat  PLAN FOR NEXT SESSION: dynamic balance, Progress LE strength and endurance, balance   Adnan Vanvoorhis, PT 03/12/2024, 4:09 PM

## 2024-03-15 ENCOUNTER — Ambulatory Visit: Admitting: Physical Therapy

## 2024-03-15 ENCOUNTER — Encounter: Payer: Self-pay | Admitting: Physical Therapy

## 2024-03-15 DIAGNOSIS — R531 Weakness: Secondary | ICD-10-CM | POA: Diagnosis not present

## 2024-03-15 DIAGNOSIS — M170 Bilateral primary osteoarthritis of knee: Secondary | ICD-10-CM

## 2024-03-15 DIAGNOSIS — R262 Difficulty in walking, not elsewhere classified: Secondary | ICD-10-CM | POA: Diagnosis not present

## 2024-03-15 NOTE — Therapy (Signed)
 OUTPATIENT PHYSICAL THERAPY LOWER EXTREMITY TREATMENT   Patient Name: Anna [name] MRN: [id number] DOB:January 14, 1936, 88 y.o., female Today's Date: 03/15/2024  END OF SESSION:  PT End of Session - 03/15/24 1055     Visit Number 7    Number of Visits 16    Date for PT Re-Evaluation 04/08/24    Authorization Type [employer] Medicare    Authorization - Visit Number 7    Progress Note Due on Visit 10    PT Start Time 1015    PT Stop Time 1055    PT Time Calculation (min) 40 min    Activity Tolerance Patient tolerated treatment well    Behavior During Therapy WFL for tasks assessed/performed               Past Medical History:  Diagnosis Date   Dermatitis    seborrehic scalp   Hiatal hernia    History of basal cell carcinoma 07/20/2017   On nose.    History of melanoma 07/20/2017   Right upper arm    Hyperlipidemia    Macular degeneration    Past Surgical History:  Procedure Laterality Date   BREAST CYST EXCISION Right 09/1962   CATARACT EXTRACTION  03/2015   melonoma Right 06/2016   arm   MOHS SURGERY Left 06/2016   nose   Patient Active Problem List   Diagnosis Date Noted   Bilateral knee pain 11/09/2023   Memory change 01/04/2023   Primary hypertension 02/07/2022   Anxious depression 02/07/2022   DDD (degenerative disc disease), cervical 05/04/2021   Neck pain 05/03/2021   Anorexia 06/30/2020   Umbilical hernia without obstruction and without gangrene 06/30/2020   Meralgia paresthetica of right side 06/22/2020   Venous insufficiency of both lower extremities 06/22/2020   Primary osteoarthritis, left shoulder 06/03/2020   Bilateral lower extremity edema 05/25/2020   Aortic atherosclerosis (HCC) 05/13/2020   Bunion, right foot 11/22/2019   Lichen planus 11/22/2019   Asthma in adult 11/22/2019   Mixed restrictive and obstructive lung disease (HCC) 11/22/2019   Primary osteoarthritis of both knees 11/22/2019   Acute pain of left shoulder 09/05/2019   Chronic  bilateral low back pain without sciatica 09/05/2019   OAB (overactive bladder) 08/08/2019   Insomnia 08/08/2019   History of melanoma 07/20/2017   History of basal cell carcinoma 07/20/2017   IFG (impaired fasting glucose) 07/17/2017   Macular degeneration    Chronic fatigue 08/06/2015   Gastroesophageal reflux disease without esophagitis 08/06/2015   Glaucoma 08/06/2015   Hiatal hernia 08/06/2015   Melanoma of right upper arm (HCC) 08/06/2015   Mixed hyperlipidemia 08/06/2015   Rupture of right biceps tendon 08/06/2015   Biceps tendonitis of both shoulders 08/06/2015    PCP: Greer Leak   REFERRING PROVIDER: Greer Leak  REFERRING DIAG: OA of bilat knees  THERAPY DIAG:  Primary osteoarthritis of both knees  Difficulty in walking, not elsewhere classified  Weakness generalized  Rationale for Evaluation and Treatment: Rehabilitation  ONSET DATE: April 2025  SUBJECTIVE:   SUBJECTIVE STATEMENT: Patient reports no new complaints. She states she feels she is making progress  PERTINENT HISTORY: OA PAIN:  Are you having pain? No  PRECAUTIONS: None  RED FLAGS: None   WEIGHT BEARING RESTRICTIONS: No  FALLS:  Has patient fallen in last 6 months? No  LIVING ENVIRONMENT: Lives with: lives alone Lives in: Other independent living facility Stairs: No Has following equipment at home: None  OCCUPATION: retired  PLOF: Independent  PATIENT GOALS: prevent falls and  not have to use a RW or cane  NEXT MD VISIT: PRN  OBJECTIVE:  Note: Objective measures were completed at Evaluation unless otherwise noted.   PATIENT SURVEYS:  KOOS -12 : 70.8%   PALPATION: No TTP bilat knees  LOWER EXTREMITY ROM:  Active ROM Right eval Left eval  Hip flexion    Hip extension    Hip abduction    Hip adduction    Hip internal rotation    Hip external rotation    Knee flexion 120 120  Knee extension 0 0  Ankle dorsiflexion    Ankle plantarflexion    Ankle inversion     Ankle eversion     (Blank rows = not tested)  LOWER EXTREMITY MMT:  MMT Right eval Left eval  Hip flexion 4- 4-  Hip extension 4- 4-  Hip abduction 4- 4-  Hip adduction    Hip internal rotation    Hip external rotation    Knee flexion 4 4  Knee extension 4 4  Ankle dorsiflexion    Ankle plantarflexion    Ankle inversion    Ankle eversion     (Blank rows = not tested)   FUNCTIONAL TESTS:  Eval:5 times sit to stand: 20.68 seconds 3 minute walk test: 414 ft total 5 x STS (03/12/24): 17.97 seconds 3 minute walk test: 446 ft total Gait speed: 2.3 ft/seconds Stair negotiation requires UE support for descending stairs due to knee instability Tandem stance - unable bilat   [facility] Adult PT Treatment:                                                DATE: 03/15/24 Therapeutic Exercise/Activity/NMR: Frances Ingles with hip add ball squeeze 3# AW x 10 bilat Sit <> stand 5# KB x 10 Corner balance Feet together: EO, EC, head turns, head nods Semi tandem: EO, head turns, head nods On airex: EO, EC, head turns, head nods, 5# KB circles Gait with head turns, head nods - CGA Gait with speed changes - min A Gait fwd/bkwd with ball toss - CGA Tandem walking at counter   [facility] Adult PT Treatment:                                                DATE: 03/12/24 Therapeutic Exercise/Activity/NMR: 5 x STS 17.97 seconds 3 min walk test: 446 feet Standing tapping different color dots with occasional CGA 180 degree turns with intermittent HHA Gait with ball toss to self - CGA/Min A Tandem walking at counter fwd/bkwd Walking with head turns/nods - min A Adductor lunge stretch at counter   [facility] Adult PT Treatment:                                                DATE: 03/05/24 Therapeutic Exercise/Activity Willie Harry: Standing Side step at counter red TB Fwd/bkwd walking at counter red TB Tandem stance on foam 2 x 30 sec - light 1 UE support Standing on foam: head turns, head nods, EC Suitcase carry 5# KB x  2 laps 3 minute walk for endurance training Sitting Sit <>  stand 5# KB x 10 SLR over yoga block x 10 bilat LAQ 3# with hip add ball squeeze 2 x 10      PATIENT EDUCATION:  Education details: Updated HEP Person educated: Patient Education method: Explanation, Demonstration, and Handouts Education comprehension: verbalized understanding and returned demonstration  HOME EXERCISE PROGRAM: Access Code: [facility] URL: [email or web address] Date: 03/15/2024 Prepared by: Lowery Rue  Exercises - Hip Abduction with Resistance Loop  - 1 x daily - 7 x weekly - 3 sets - 10 reps - Hip Extension with Resistance Loop  - 1 x daily - 7 x weekly - 3 sets - 10 reps - Standing Hamstring Curl with Resistance  - 1 x daily - 7 x weekly - 3 sets - 10 reps - Sit to Stand Without Arm Support  - 1 x daily - 7 x weekly - 1 sets - 10 reps - Small Range Straight Leg Raise  - 1 x daily - 7 x weekly - 3 sets - 10 reps - 5 sec hold - Side Stepping with Resistance at Ankles and Counter Support  - 1 x daily - 7 x weekly - 3 sets - 10 reps - 360 Degree Turn in Both Directions  - 1 x daily - 7 x weekly - 1 sets - 10 reps - Star Taps  - 1 x daily - 7 x weekly - 2 sets - 10 reps - Semi-Tandem Corner Balance: Eyes Open With Head Turns  - 1 x daily - 7 x weekly - 3 sets - 10 reps  ASSESSMENT:  CLINICAL IMPRESSION: Pt with good balance with feet in neutral stance despite EC or head movements. She had the most difficulty with airex balance and semi tandem head turns. Pt with improved performance with gait with ball toss to self and is able to perform backward gait with ball toss with CGA. Added semi tandem stance with head turns to HEP with instructions to perform with UE support on kitchen counter to reduce fall risk   EVAL: Patient is a 88 y.o. female who was seen today for physical therapy evaluation and treatment for OA of bilat knees. Pt presents with decreased hip and knee strength, decreased  functional activity tolerance, impaired gait and balance and will benefit from skilled PT to address deficits and improve functional mobility and reduce risk of falls.   OBJECTIVE IMPAIRMENTS: decreased activity tolerance, difficulty walking, and decreased strength.      GOALS: Goals reviewed with patient? Yes  SHORT TERM GOALS: Target date: 03/11/2024  Pt will be independent with initial HEP Baseline: Goal status: MET  2.  Pt will improve 5 x STS to <= 15 seconds Baseline:  Goal status:  IN PROGRESS    LONG TERM GOALS: Target date: 04/08/2024  Pt will be independent in advanced HEP Baseline:  Goal status: INITIAL  2.  Pt will improve KOOS -12 to >= 80% to demo improved functional mobility Baseline:  Goal status: INITIAL  3.  Pt will improve bilat LE strength to 4+/5 to improve standing and walking tolerance Baseline:  Goal status: INITIAL  4.  Pt will improve 3 minute walk to >= 500' to demo improved gait and activity tolerance Baseline:  Goal status: IN PROGRESS  5.  Pt will demo improved balance by performing tandem stance x 15 seconds bilat without UE support Baseline:  Goal status: INITIAL     PLAN:  PT FREQUENCY: 2x/week  PT DURATION: 8 weeks  PLANNED INTERVENTIONS: 16109- PT Re-evaluation, 97110-Therapeutic  exercises, 97530- Therapeutic activity, [id number]- Neuromuscular re-education, [phone]- Self Care, 29528- Manual therapy, [phone]- Aquatic Therapy, [phone]- Ionotophoresis 4mg /ml Dexamethasone, Patient/Family education, Balance training, Stair training, Taping, Cryotherapy, and Moist heat  PLAN FOR NEXT SESSION: dynamic balance, Progress LE strength and endurance, balance   Eh Sesay, PT 03/15/2024, 10:55 AM

## 2024-03-19 ENCOUNTER — Ambulatory Visit: Admitting: Rehabilitative and Restorative Service Providers"

## 2024-03-21 ENCOUNTER — Ambulatory Visit

## 2024-03-21 DIAGNOSIS — R262 Difficulty in walking, not elsewhere classified: Secondary | ICD-10-CM

## 2024-03-21 DIAGNOSIS — M170 Bilateral primary osteoarthritis of knee: Secondary | ICD-10-CM

## 2024-03-21 DIAGNOSIS — R531 Weakness: Secondary | ICD-10-CM | POA: Diagnosis not present

## 2024-03-21 NOTE — Therapy (Signed)
 OUTPATIENT PHYSICAL THERAPY LOWER EXTREMITY TREATMENT   Patient Name: Anna [name] MRN: [id number] DOB:October 14, 1935, 88 y.o., female Today's Date: 03/21/2024  END OF SESSION:  PT End of Session - 03/21/24 1349     Visit Number 8    Number of Visits 16    Date for PT Re-Evaluation 04/08/24    Authorization Type [employer] Medicare    Authorization Time Period 16 visits approved for PT 02/12/24 - 04/08/24    Authorization - Visit Number 8    Authorization - Number of Visits 16    Progress Note Due on Visit 10    PT Start Time 1355    PT Stop Time 1442    PT Time Calculation (min) 47 min    Activity Tolerance Patient tolerated treatment well    Behavior During Therapy [facility] for tasks assessed/performed         Past Medical History:  Diagnosis Date   Dermatitis    seborrehic scalp   Hiatal hernia    History of basal cell carcinoma 07/20/2017   On nose.    History of melanoma 07/20/2017   Right upper arm    Hyperlipidemia    Macular degeneration    Past Surgical History:  Procedure Laterality Date   BREAST CYST EXCISION Right 09/1962   CATARACT EXTRACTION  03/2015   melonoma Right 06/2016   arm   MOHS SURGERY Left 06/2016   nose   Patient Active Problem List   Diagnosis Date Noted   Bilateral knee pain 11/09/2023   Memory change 01/04/2023   Primary hypertension 02/07/2022   Anxious depression 02/07/2022   DDD (degenerative disc disease), cervical 05/04/2021   Neck pain 05/03/2021   Anorexia 06/30/2020   Umbilical hernia without obstruction and without gangrene 06/30/2020   Meralgia paresthetica of right side 06/22/2020   Venous insufficiency of both lower extremities 06/22/2020   Primary osteoarthritis, left shoulder 06/03/2020   Bilateral lower extremity edema 05/25/2020   Aortic atherosclerosis (HCC) 05/13/2020   Bunion, right foot 11/22/2019   Lichen planus 11/22/2019   Asthma in adult 11/22/2019   Mixed restrictive and obstructive lung disease (HCC) 11/22/2019    Primary osteoarthritis of both knees 11/22/2019   Acute pain of left shoulder 09/05/2019   Chronic bilateral low back pain without sciatica 09/05/2019   OAB (overactive bladder) 08/08/2019   Insomnia 08/08/2019   History of melanoma 07/20/2017   History of basal cell carcinoma 07/20/2017   IFG (impaired fasting glucose) 07/17/2017   Macular degeneration    Chronic fatigue 08/06/2015   Gastroesophageal reflux disease without esophagitis 08/06/2015   Glaucoma 08/06/2015   Hiatal hernia 08/06/2015   Melanoma of right upper arm (HCC) 08/06/2015   Mixed hyperlipidemia 08/06/2015   Rupture of right biceps tendon 08/06/2015   Biceps tendonitis of both shoulders 08/06/2015    PCP: Alvan   REFERRING PROVIDER: Alvan  REFERRING DIAG: OA of bilat knees  THERAPY DIAG:  Primary osteoarthritis of both knees  Difficulty in walking, not elsewhere classified  Weakness generalized  Rationale for Evaluation and Treatment: Rehabilitation  ONSET DATE: April 2025  SUBJECTIVE:   SUBJECTIVE STATEMENT: Patient reports her knees are feeling stronger but her balance continues to feel unsteady.   PERTINENT HISTORY: OA PAIN:  Are you having pain? No  PRECAUTIONS: None  RED FLAGS: None   WEIGHT BEARING RESTRICTIONS: No  FALLS:  Has patient fallen in last 6 months? No  LIVING ENVIRONMENT: Lives with: lives alone Lives in: Other independent living facility Stairs:  No Has following equipment at home: None  OCCUPATION: retired  PLOF: Independent  PATIENT GOALS: prevent falls and not have to use a RW or cane  NEXT MD VISIT: PRN  OBJECTIVE:  Note: Objective measures were completed at Evaluation unless otherwise noted.   PATIENT SURVEYS:  KOOS -12 : 70.8%   PALPATION: No TTP bilat knees  LOWER EXTREMITY ROM:  Active ROM Right eval Left eval  Hip flexion    Hip extension    Hip abduction    Hip adduction    Hip internal rotation    Hip external rotation     Knee flexion 120 120  Knee extension 0 0  Ankle dorsiflexion    Ankle plantarflexion    Ankle inversion    Ankle eversion     (Blank rows = not tested)  LOWER EXTREMITY MMT:  MMT Right eval Left eval  Hip flexion 4- 4-  Hip extension 4- 4-  Hip abduction 4- 4-  Hip adduction    Hip internal rotation    Hip external rotation    Knee flexion 4 4  Knee extension 4 4  Ankle dorsiflexion    Ankle plantarflexion    Ankle inversion    Ankle eversion     (Blank rows = not tested)   FUNCTIONAL TESTS:  Eval:5 times sit to stand: 20.68 seconds 3 minute walk test: 414 ft total 5 x STS (03/12/24): 17.97 seconds 3 minute walk test: 446 ft total Gait speed: 2.3 ft/seconds Stair negotiation requires UE support for descending stairs due to knee instability Tandem stance - unable bilat   [facility] Adult PT Treatment:                                                DATE: 03/21/2024 Therapeutic Exercise: Seated: LAQ with 3#AW + ball squeeze x10 (bil) --> no ankle weights x10 (alleviated Lt groin pain) Marching + 3#AW 2x20 (SBA)  Neuromuscular re-ed: Semi tandem walking 4x5' --> table for UE support as needed Walking + head turns every 3 steps --> CGA/close SBA 2x80' Therapeutic Activity: STS + 5# suitcase carry x10 Sit to stand + 5# farmer carry <--> walking 20' x 4 Side stepping along table --> no UE support Stair navigation 4 steps + bilateral railing Anterior weight shifting on 4 step up --> painful on Rt Black stepper: step up/down leading each leg   [facility] Adult PT Treatment:                                                DATE: 03/15/24 Therapeutic Exercise/Activity/NMR: ELBERT with hip add ball squeeze 3# AW x 10 bilat Sit <> stand 5# KB x 10 Corner balance Feet together: EO, EC, head turns, head nods Semi tandem: EO, head turns, head nods On airex: EO, EC, head turns, head nods, 5# KB circles Gait with head turns, head nods - CGA Gait with speed changes - min A Gait fwd/bkwd  with ball toss - CGA Tandem walking at counter   [facility] Adult PT Treatment:  DATE: 03/12/24 Therapeutic Exercise/Activity/NMR: 5 x STS 17.97 seconds 3 min walk test: 446 feet Standing tapping different color dots with occasional CGA 180 degree turns with intermittent HHA Gait with ball toss to self - CGA/Min A Tandem walking at counter fwd/bkwd Walking with head turns/nods - min A Adductor lunge stretch at counter   PATIENT EDUCATION:  Education details: Updated HEP Person educated: Patient Education method: Programmer, multimedia, Facilities manager, and Handouts Education comprehension: verbalized understanding and returned demonstration  HOME EXERCISE PROGRAM: Access Code: [facility] URL: [email or web address] Date: 03/15/2024 Prepared by: Darice Conine  Exercises - Hip Abduction with Resistance Loop  - 1 x daily - 7 x weekly - 3 sets - 10 reps - Hip Extension with Resistance Loop  - 1 x daily - 7 x weekly - 3 sets - 10 reps - Standing Hamstring Curl with Resistance  - 1 x daily - 7 x weekly - 3 sets - 10 reps - Sit to Stand Without Arm Support  - 1 x daily - 7 x weekly - 1 sets - 10 reps - Small Range Straight Leg Raise  - 1 x daily - 7 x weekly - 3 sets - 10 reps - 5 sec hold - Side Stepping with Resistance at Ankles and Counter Support  - 1 x daily - 7 x weekly - 3 sets - 10 reps - 360 Degree Turn in Both Directions  - 1 x daily - 7 x weekly - 1 sets - 10 reps - Star Taps  - 1 x daily - 7 x weekly - 2 sets - 10 reps - Semi-Tandem Corner Balance: Eyes Open With Head Turns  - 1 x daily - 7 x weekly - 3 sets - 10 reps  ASSESSMENT:  CLINICAL IMPRESSION: Moderate postural sway exhibited during walking with head turns; CGA provided for safety with minor LOB episodes. Decreased anterior weight shifting demonstrated when ascending low steps. Seated rest breaks taken as needed due to fatigue with walking and standing exercises.   EVAL:  Patient is a 88 y.o. female who was seen today for physical therapy evaluation and treatment for OA of bilat knees. Pt presents with decreased hip and knee strength, decreased functional activity tolerance, impaired gait and balance and will benefit from skilled PT to address deficits and improve functional mobility and reduce risk of falls.   OBJECTIVE IMPAIRMENTS: decreased activity tolerance, difficulty walking, and decreased strength.    GOALS: Goals reviewed with patient? Yes  SHORT TERM GOALS: Target date: 03/11/2024  Pt will be independent with initial HEP Baseline: Goal status: MET  2.  Pt will improve 5 x STS to <= 15 seconds Baseline:  Goal status:  IN PROGRESS    LONG TERM GOALS: Target date: 04/08/2024  Pt will be independent in advanced HEP Baseline:  Goal status: INITIAL  2.  Pt will improve KOOS -12 to >= 80% to demo improved functional mobility Baseline:  Goal status: INITIAL  3.  Pt will improve bilat LE strength to 4+/5 to improve standing and walking tolerance Baseline:  Goal status: INITIAL  4.  Pt will improve 3 minute walk to >= 500' to demo improved gait and activity tolerance Baseline:  Goal status: IN PROGRESS  5.  Pt will demo improved balance by performing tandem stance x 15 seconds bilat without UE support Baseline:  Goal status: INITIAL    PLAN:  PT FREQUENCY: 2x/week  PT DURATION: 8 weeks  PLANNED INTERVENTIONS: 97164- PT Re-evaluation, 97110-Therapeutic exercises, 97530- Therapeutic activity, [id number]- Neuromuscular  re-education, [phone]- Self Care, 02859- Manual therapy, [phone]- Aquatic Therapy, [phone]- Ionotophoresis 4mg /ml Dexamethasone, Patient/Family education, Balance training, Stair training, Taping, Cryotherapy, and Moist heat  PLAN FOR NEXT SESSION: dynamic balance, Progress LE strength and endurance, balance   Lamarr GORMAN Price, PTA 03/21/2024, 2:43 PM

## 2024-03-26 ENCOUNTER — Ambulatory Visit: Attending: Family Medicine | Admitting: Physical Therapy

## 2024-03-26 ENCOUNTER — Encounter: Payer: Self-pay | Admitting: Physical Therapy

## 2024-03-26 DIAGNOSIS — M170 Bilateral primary osteoarthritis of knee: Secondary | ICD-10-CM | POA: Insufficient documentation

## 2024-03-26 DIAGNOSIS — R531 Weakness: Secondary | ICD-10-CM | POA: Insufficient documentation

## 2024-03-26 DIAGNOSIS — R262 Difficulty in walking, not elsewhere classified: Secondary | ICD-10-CM | POA: Insufficient documentation

## 2024-03-26 NOTE — Therapy (Signed)
 OUTPATIENT PHYSICAL THERAPY LOWER EXTREMITY TREATMENT   Patient Name: Anna [name] MRN: [id number] DOB:June 15, 1936, 88 y.o., female Today's Date: 03/26/2024  END OF SESSION:  PT End of Session - 03/26/24 1443     Visit Number 9    Number of Visits 16    Date for PT Re-Evaluation 04/08/24    Authorization Type [employer] Medicare    Authorization Time Period 16 visits approved for PT 02/12/24 - 04/08/24    Authorization - Visit Number 9    Progress Note Due on Visit 10    PT Start Time 1405    PT Stop Time 1443    PT Time Calculation (min) 38 min    Activity Tolerance Patient tolerated treatment well    Behavior During Therapy [facility] for tasks assessed/performed          Past Medical History:  Diagnosis Date   Dermatitis    seborrehic scalp   Hiatal hernia    History of basal cell carcinoma 07/20/2017   On nose.    History of melanoma 07/20/2017   Right upper arm    Hyperlipidemia    Macular degeneration    Past Surgical History:  Procedure Laterality Date   BREAST CYST EXCISION Right 09/1962   CATARACT EXTRACTION  03/2015   melonoma Right 06/2016   arm   MOHS SURGERY Left 06/2016   nose   Patient Active Problem List   Diagnosis Date Noted   Bilateral knee pain 11/09/2023   Memory change 01/04/2023   Primary hypertension 02/07/2022   Anxious depression 02/07/2022   DDD (degenerative disc disease), cervical 05/04/2021   Neck pain 05/03/2021   Anorexia 06/30/2020   Umbilical hernia without obstruction and without gangrene 06/30/2020   Meralgia paresthetica of right side 06/22/2020   Venous insufficiency of both lower extremities 06/22/2020   Primary osteoarthritis, left shoulder 06/03/2020   Bilateral lower extremity edema 05/25/2020   Aortic atherosclerosis (HCC) 05/13/2020   Bunion, right foot 11/22/2019   Lichen planus 11/22/2019   Asthma in adult 11/22/2019   Mixed restrictive and obstructive lung disease (HCC) 11/22/2019   Primary osteoarthritis of both knees  11/22/2019   Acute pain of left shoulder 09/05/2019   Chronic bilateral low back pain without sciatica 09/05/2019   OAB (overactive bladder) 08/08/2019   Insomnia 08/08/2019   History of melanoma 07/20/2017   History of basal cell carcinoma 07/20/2017   IFG (impaired fasting glucose) 07/17/2017   Macular degeneration    Chronic fatigue 08/06/2015   Gastroesophageal reflux disease without esophagitis 08/06/2015   Glaucoma 08/06/2015   Hiatal hernia 08/06/2015   Melanoma of right upper arm (HCC) 08/06/2015   Mixed hyperlipidemia 08/06/2015   Rupture of right biceps tendon 08/06/2015   Biceps tendonitis of both shoulders 08/06/2015    PCP: Alvan   REFERRING PROVIDER: Alvan  REFERRING DIAG: OA of bilat knees  THERAPY DIAG:  Primary osteoarthritis of both knees  Difficulty in walking, not elsewhere classified  Weakness generalized  Rationale for Evaluation and Treatment: Rehabilitation  ONSET DATE: April 2025  SUBJECTIVE:   SUBJECTIVE STATEMENT: Patient reports she continues to feel more confident with gait and balance  PERTINENT HISTORY: OA PAIN:  Are you having pain? No  PRECAUTIONS: None  RED FLAGS: None   WEIGHT BEARING RESTRICTIONS: No  FALLS:  Has patient fallen in last 6 months? No  LIVING ENVIRONMENT: Lives with: lives alone Lives in: Other independent living facility Stairs: No Has following equipment at home: None  OCCUPATION: retired  PLOF: Independent  PATIENT GOALS: prevent falls and not have to use a RW or cane  NEXT MD VISIT: PRN  OBJECTIVE:  Note: Objective measures were completed at Evaluation unless otherwise noted.   PATIENT SURVEYS:  KOOS -12 : 70.8%   PALPATION: No TTP bilat knees  LOWER EXTREMITY ROM:  Active ROM Right eval Left eval  Hip flexion    Hip extension    Hip abduction    Hip adduction    Hip internal rotation    Hip external rotation    Knee flexion 120 120  Knee extension 0 0  Ankle  dorsiflexion    Ankle plantarflexion    Ankle inversion    Ankle eversion     (Blank rows = not tested)  LOWER EXTREMITY MMT:  MMT Right eval Left eval  Hip flexion 4- 4-  Hip extension 4- 4-  Hip abduction 4- 4-  Hip adduction    Hip internal rotation    Hip external rotation    Knee flexion 4 4  Knee extension 4 4  Ankle dorsiflexion    Ankle plantarflexion    Ankle inversion    Ankle eversion     (Blank rows = not tested)   FUNCTIONAL TESTS:  Eval:5 times sit to stand: 20.68 seconds 3 minute walk test: 414 ft total 5 x STS (03/12/24): 17.97 seconds 3 minute walk test: 446 ft total Gait speed: 2.3 ft/seconds Stair negotiation requires UE support for descending stairs due to knee instability Tandem stance - unable bilat   [facility] Adult PT Treatment:                                                DATE: 03/26/24 Therapeutic Exercise/Activity/NMR: Farmers carry 5# bilat Sit <> stand holding 5# bilat x 10 Gait at counter Tandem gait Gait with head turns, nods Backward walking Tap ups 6'' step Runners step up 6'' step light UE support 180 degree turns with CGA/SBA Standing march with 3# AW 1 UE support Gait with start/stop - CGA KOOS -12 = 75% Tandem stance - 8 sec bilat   [facility] Adult PT Treatment:                                                DATE: 03/21/2024 Therapeutic Exercise: Seated: LAQ with 3#AW + ball squeeze x10 (bil) --> no ankle weights x10 (alleviated Lt groin pain) Marching + 3#AW 2x20 (SBA)  Neuromuscular re-ed: Semi tandem walking 4x5' --> table for UE support as needed Walking + head turns every 3 steps --> CGA/close SBA 2x80' Therapeutic Activity: STS + 5# suitcase carry x10 Sit to stand + 5# farmer carry <--> walking 20' x 4 Side stepping along table --> no UE support Stair navigation 4 steps + bilateral railing Anterior weight shifting on 4 step up --> painful on Rt Black stepper: step up/down leading each leg   [facility] Adult PT Treatment:                                                 DATE: 03/15/24 Therapeutic Exercise/Activity/NMR:  LAQ with hip add ball squeeze 3# AW x 10 bilat Sit <> stand 5# KB x 10 Corner balance Feet together: EO, EC, head turns, head nods Semi tandem: EO, head turns, head nods On airex: EO, EC, head turns, head nods, 5# KB circles Gait with head turns, head nods - CGA Gait with speed changes - min A Gait fwd/bkwd with ball toss - CGA Tandem walking at counter    PATIENT EDUCATION:  Education details: Updated HEP Person educated: Patient Education method: Explanation, Demonstration, and Handouts Education comprehension: verbalized understanding and returned demonstration  HOME EXERCISE PROGRAM: Access Code: [facility] URL: [email or web address] Date: 03/15/2024 Prepared by: Darice Conine  Exercises - Hip Abduction with Resistance Loop  - 1 x daily - 7 x weekly - 3 sets - 10 reps - Hip Extension with Resistance Loop  - 1 x daily - 7 x weekly - 3 sets - 10 reps - Standing Hamstring Curl with Resistance  - 1 x daily - 7 x weekly - 3 sets - 10 reps - Sit to Stand Without Arm Support  - 1 x daily - 7 x weekly - 1 sets - 10 reps - Small Range Straight Leg Raise  - 1 x daily - 7 x weekly - 3 sets - 10 reps - 5 sec hold - Side Stepping with Resistance at Ankles and Counter Support  - 1 x daily - 7 x weekly - 3 sets - 10 reps - 360 Degree Turn in Both Directions  - 1 x daily - 7 x weekly - 1 sets - 10 reps - Star Taps  - 1 x daily - 7 x weekly - 2 sets - 10 reps - Semi-Tandem Corner Balance: Eyes Open With Head Turns  - 1 x daily - 7 x weekly - 3 sets - 10 reps  ASSESSMENT:  CLINICAL IMPRESSION: Pt is improving tolerance to exercise and balance activities. She continues to require CGA or UE support for gait with head turns and start/stops. She feels comfortable with HEP and plans to d/c after next session  EVAL: Patient is a 88 y.o. female who was seen today for physical therapy  evaluation and treatment for OA of bilat knees. Pt presents with decreased hip and knee strength, decreased functional activity tolerance, impaired gait and balance and will benefit from skilled PT to address deficits and improve functional mobility and reduce risk of falls.   OBJECTIVE IMPAIRMENTS: decreased activity tolerance, difficulty walking, and decreased strength.    GOALS: Goals reviewed with patient? Yes  SHORT TERM GOALS: Target date: 03/11/2024  Pt will be independent with initial HEP Baseline: Goal status: MET  2.  Pt will improve 5 x STS to <= 15 seconds Baseline:  Goal status:  IN PROGRESS    LONG TERM GOALS: Target date: 04/08/2024  Pt will be independent in advanced HEP Baseline:  Goal status: INITIAL  2.  Pt will improve KOOS -12 to >= 80% to demo improved functional mobility Baseline:  Goal status: NOT MET - 75% 03/26/24  3.  Pt will improve bilat LE strength to 4+/5 to improve standing and walking tolerance Baseline:  Goal status: INITIAL  4.  Pt will improve 3 minute walk to >= 500' to demo improved gait and activity tolerance Baseline:  Goal status: IN PROGRESS  5.  Pt will demo improved balance by performing tandem stance x 15 seconds bilat without UE support Baseline:  Goal status: NOT MET - 8 seconds 03/26/24    PLAN:  PT FREQUENCY: 2x/week  PT DURATION: 8 weeks  PLANNED INTERVENTIONS: 97164- PT Re-evaluation, 97110-Therapeutic exercises, 97530- Therapeutic activity, [id number]- Neuromuscular re-education, 97535- Self Care, 02859- Manual therapy, [id number]- Aquatic Therapy, [phone]- Ionotophoresis 4mg /ml Dexamethasone, Patient/Family education, Balance training, Stair training, Taping, Cryotherapy, and Moist heat  PLAN FOR NEXT SESSION: DISCHARGE AFTER 03/28/24 session!   Kynzi Levay, PT 03/26/2024, 2:44 PM

## 2024-03-26 NOTE — Progress Notes (Signed)
   03/26/2024  Patient ID: Anna [name], female   DOB: May 03, 1936, 88 y.o.   MRN: [id number]  This patient is appearing on a report for being at risk of failing the adherence measure for cholesterol (statin) and hypertension (ACEi/ARB) medications this calendar year.   Medication: olmesartan  20mg , lovastatin 40mg  Last fill date: 5/23 for 28 day supply  Insurance report was not up to date. No action needed at this time.   Report reflecting last fill date of 4/1, and patient filled 4/29 as well as 5/23.  Anna [name], PharmD, DPLA

## 2024-03-28 ENCOUNTER — Ambulatory Visit

## 2024-03-28 DIAGNOSIS — M170 Bilateral primary osteoarthritis of knee: Secondary | ICD-10-CM

## 2024-03-28 DIAGNOSIS — R531 Weakness: Secondary | ICD-10-CM | POA: Diagnosis not present

## 2024-03-28 DIAGNOSIS — R262 Difficulty in walking, not elsewhere classified: Secondary | ICD-10-CM | POA: Diagnosis not present

## 2024-03-28 NOTE — Therapy (Cosign Needed Addendum)
 OUTPATIENT PHYSICAL THERAPY LOWER EXTREMITY TREATMENT Progress Note and DISCHARGE Reporting Period 02/12/2024 to 03/28/2024  See note below for Objective Data and Assessment of Progress/Goals.      Patient Name: Anna [name] MRN: [id number] DOB:01-Jun-1936, 88 y.o., female Today's Date: 03/28/2024  END OF SESSION:  PT End of Session - 03/28/24 1353     Visit Number 10    Number of Visits 16    Date for PT Re-Evaluation 04/08/24    Authorization Type [employer] Medicare    Authorization Time Period 16 visits approved for PT 02/12/24 - 04/08/24    Authorization - Visit Number 10    Authorization - Number of Visits 16    Progress Note Due on Visit 10    PT Start Time 1358    PT Stop Time 1442    PT Time Calculation (min) 44 min    Activity Tolerance Patient tolerated treatment well    Behavior During Therapy [facility] for tasks assessed/performed          Past Medical History:  Diagnosis Date   Dermatitis    seborrehic scalp   Hiatal hernia    History of basal cell carcinoma 07/20/2017   On nose.    History of melanoma 07/20/2017   Right upper arm    Hyperlipidemia    Macular degeneration    Past Surgical History:  Procedure Laterality Date   BREAST CYST EXCISION Right 09/1962   CATARACT EXTRACTION  03/2015   melonoma Right 06/2016   arm   MOHS SURGERY Left 06/2016   nose   Patient Active Problem List   Diagnosis Date Noted   Bilateral knee pain 11/09/2023   Memory change 01/04/2023   Primary hypertension 02/07/2022   Anxious depression 02/07/2022   DDD (degenerative disc disease), cervical 05/04/2021   Neck pain 05/03/2021   Anorexia 06/30/2020   Umbilical hernia without obstruction and without gangrene 06/30/2020   Meralgia paresthetica of right side 06/22/2020   Venous insufficiency of both lower extremities 06/22/2020   Primary osteoarthritis, left shoulder 06/03/2020   Bilateral lower extremity edema 05/25/2020   Aortic atherosclerosis (HCC) 05/13/2020   Bunion,  right foot 11/22/2019   Lichen planus 11/22/2019   Asthma in adult 11/22/2019   Mixed restrictive and obstructive lung disease (HCC) 11/22/2019   Primary osteoarthritis of both knees 11/22/2019   Acute pain of left shoulder 09/05/2019   Chronic bilateral low back pain without sciatica 09/05/2019   OAB (overactive bladder) 08/08/2019   Insomnia 08/08/2019   History of melanoma 07/20/2017   History of basal cell carcinoma 07/20/2017   IFG (impaired fasting glucose) 07/17/2017   Macular degeneration    Chronic fatigue 08/06/2015   Gastroesophageal reflux disease without esophagitis 08/06/2015   Glaucoma 08/06/2015   Hiatal hernia 08/06/2015   Melanoma of right upper arm (HCC) 08/06/2015   Mixed hyperlipidemia 08/06/2015   Rupture of right biceps tendon 08/06/2015   Biceps tendonitis of both shoulders 08/06/2015    PCP: Alvan   REFERRING PROVIDER: Alvan  REFERRING DIAG: OA of bilat knees  THERAPY DIAG:  Primary osteoarthritis of both knees  Difficulty in walking, not elsewhere classified  Weakness generalized  Rationale for Evaluation and Treatment: Rehabilitation  ONSET DATE: April 2025  SUBJECTIVE:   SUBJECTIVE STATEMENT: Patient reports she has been consistent exercises; states she did a [employer] trip and felt confident walking around and carrying bags.   PERTINENT HISTORY: OA PAIN:  Are you having pain? No  PRECAUTIONS: None  RED FLAGS: None  WEIGHT BEARING RESTRICTIONS: No  FALLS:  Has patient fallen in last 6 months? No  LIVING ENVIRONMENT: Lives with: lives alone Lives in: Other independent living facility Stairs: No Has following equipment at home: None  OCCUPATION: retired  PLOF: Independent  PATIENT GOALS: prevent falls and not have to use a RW or cane  NEXT MD VISIT: PRN  OBJECTIVE:  Note: Objective measures were completed at Evaluation unless otherwise noted.   PATIENT SURVEYS:  KOOS -12 : 70.8%   PALPATION: No TTP bilat  knees  LOWER EXTREMITY ROM:  Active ROM Right eval Left eval  Hip flexion    Hip extension    Hip abduction    Hip adduction    Hip internal rotation    Hip external rotation    Knee flexion 120 120  Knee extension 0 0  Ankle dorsiflexion    Ankle plantarflexion    Ankle inversion    Ankle eversion     (Blank rows = not tested)  LOWER EXTREMITY MMT:  MMT Right eval Left eval Right 03/28/24 Left 03/28/24  Hip flexion 4- 4- 4+ 4+  Hip extension 4- 4- 4 4  Hip abduction 4- 4- 4 4+  Hip adduction      Hip internal rotation      Hip external rotation      Knee flexion 4 4 4+ 4+  Knee extension 4 4 4+ 4+  Ankle dorsiflexion      Ankle plantarflexion      Ankle inversion      Ankle eversion       (Blank rows = not tested)   FUNCTIONAL TESTS:  Eval:5 times sit to stand: 20.68 seconds 3 minute walk test: 414 ft total 5 x STS (03/12/24): 17.97 seconds 3 minute walk test: 446 ft total Gait speed: 2.3 ft/seconds Stair negotiation requires UE support for descending stairs due to knee instability Tandem stance - unable bilat   [facility] Adult PT Treatment:                                                DATE: 03/28/2024 Neuromuscular re-ed: Walking x 320' + head turns (direction call out from therapist) Semi-tandem walking by counter Therapeutic Activity: 4/6 steps ascend/descend + bilateral railing --> descending backwards & lateral Updating LTGs 3 min walk test   [facility] Adult PT Treatment:                                                DATE: 03/26/24 Therapeutic Exercise/Activity/NMR: Farmers carry 5# bilat Sit <> stand holding 5# bilat x 10 Gait at counter Tandem gait Gait with head turns, nods Backward walking Tap ups 6'' step Runners step up 6'' step light UE support 180 degree turns with CGA/SBA Standing march with 3# AW 1 UE support Gait with start/stop - CGA KOOS -12 = 75% Tandem stance - 8 sec bilat   [facility] Adult PT Treatment:                                                 DATE: 03/21/2024 Therapeutic  Exercise: Seated: LAQ with 3#AW + ball squeeze x10 (bil) --> no ankle weights x10 (alleviated Lt groin pain) Marching + 3#AW 2x20 (SBA)  Neuromuscular re-ed: Semi tandem walking 4x5' --> table for UE support as needed Walking + head turns every 3 steps --> CGA/close SBA 2x80' Therapeutic Activity: STS + 5# suitcase carry x10 Sit to stand + 5# farmer carry <--> walking 20' x 4 Side stepping along table --> no UE support Stair navigation 4 steps + bilateral railing Anterior weight shifting on 4 step up --> painful on Rt Black stepper: step up/down leading each leg   PATIENT EDUCATION:  Education details: Updated HEP Person educated: Patient Education method: Explanation, Demonstration, and Handouts Education comprehension: verbalized understanding and returned demonstration  HOME EXERCISE PROGRAM: Access Code: [facility] URL: [email or web address] Date: 03/15/2024 Prepared by: Darice Conine  Exercises - Hip Abduction with Resistance Loop  - 1 x daily - 7 x weekly - 3 sets - 10 reps - Hip Extension with Resistance Loop  - 1 x daily - 7 x weekly - 3 sets - 10 reps - Standing Hamstring Curl with Resistance  - 1 x daily - 7 x weekly - 3 sets - 10 reps - Sit to Stand Without Arm Support  - 1 x daily - 7 x weekly - 1 sets - 10 reps - Small Range Straight Leg Raise  - 1 x daily - 7 x weekly - 3 sets - 10 reps - 5 sec hold - Side Stepping with Resistance at Ankles and Counter Support  - 1 x daily - 7 x weekly - 3 sets - 10 reps - 360 Degree Turn in Both Directions  - 1 x daily - 7 x weekly - 1 sets - 10 reps - Star Taps  - 1 x daily - 7 x weekly - 2 sets - 10 reps - Semi-Tandem Corner Balance: Eyes Open With Head Turns  - 1 x daily - 7 x weekly - 3 sets - 10 reps  ASSESSMENT:  CLINICAL IMPRESSION: Patient demonstrates good progress with LE strength; Rt hip measured slightly weaker than Lt in abduction. Endurance improved with  distance walked during 3 min walk test, requiring no rest breaks. Patient communicated increased confidence with balance and completing ADLs.  EVAL: Patient is a 88 y.o. female who was seen today for physical therapy evaluation and treatment for OA of bilat knees. Pt presents with decreased hip and knee strength, decreased functional activity tolerance, impaired gait and balance and will benefit from skilled PT to address deficits and improve functional mobility and reduce risk of falls.   OBJECTIVE IMPAIRMENTS: decreased activity tolerance, difficulty walking, and decreased strength.    GOALS: Goals reviewed with patient? Yes  SHORT TERM GOALS: Target date: 03/11/2024  Pt will be independent with initial HEP Baseline: Goal status: MET  2.  Pt will improve 5 x STS to <= 15 seconds Baseline:  Goal status:  IN PROGRESS    LONG TERM GOALS: Target date: 04/08/2024  Pt will be independent in advanced HEP Baseline:  Goal status: MET  2.  Pt will improve KOOS -12 to >= 80% to demo improved functional mobility Baseline:  Goal status: NOT MET - 75% 03/26/24  3.  Pt will improve bilat LE strength to 4+/5 to improve standing and walking tolerance Baseline:  03/28/24: see above Goal status: PARTIALLY MET  4.  Pt will improve 3 minute walk to >= 500' to demo improved gait and activity tolerance Baseline:  03/28/24:  467' Goal status: NOT MET  5.  Pt will demo improved balance by performing tandem stance x 15 seconds bilat without UE support Baseline:  Goal status: NOT MET - 8 seconds 03/26/24    PLAN:  PT FREQUENCY: 2x/week  PT DURATION: 8 weeks  PLANNED INTERVENTIONS: 97164- PT Re-evaluation, 97110-Therapeutic exercises, 97530- Therapeutic activity, 97112- Neuromuscular re-education, 97535- Self Care, 02859- Manual therapy, [phone]- Aquatic Therapy, [phone]- Ionotophoresis 4mg /ml Dexamethasone, Patient/Family education, Balance training, Stair training, Taping, Cryotherapy, and Moist  heat  PLAN FOR NEXT SESSION: Discharge   Lamarr GORMAN Price, PTA 03/28/2024, 2:48 PM Darice Conine, PT,DPT07/08/259:43 AM  PHYSICAL THERAPY DISCHARGE SUMMARY  Visits from Start of Care: 10  Current functional level related to goals / functional outcomes: Improved strength and balance   Remaining deficits: See above   Education / Equipment: HEP   Patient agrees to discharge. Patient goals were partially met. Patient is being discharged due to being pleased with the current functional level. Darice Conine, PT,DPT08/29/2511:43 AM

## 2024-05-22 DIAGNOSIS — H353231 Exudative age-related macular degeneration, bilateral, with active choroidal neovascularization: Secondary | ICD-10-CM | POA: Diagnosis not present

## 2024-05-22 DIAGNOSIS — H43823 Vitreomacular adhesion, bilateral: Secondary | ICD-10-CM | POA: Diagnosis not present

## 2024-05-22 DIAGNOSIS — H353113 Nonexudative age-related macular degeneration, right eye, advanced atrophic without subfoveal involvement: Secondary | ICD-10-CM | POA: Diagnosis not present

## 2024-05-22 DIAGNOSIS — H35033 Hypertensive retinopathy, bilateral: Secondary | ICD-10-CM | POA: Diagnosis not present

## 2024-05-22 DIAGNOSIS — H401132 Primary open-angle glaucoma, bilateral, moderate stage: Secondary | ICD-10-CM | POA: Diagnosis not present

## 2024-05-22 DIAGNOSIS — Z961 Presence of intraocular lens: Secondary | ICD-10-CM | POA: Diagnosis not present

## 2024-05-23 ENCOUNTER — Ambulatory Visit: Payer: Medicare Other | Admitting: Neurology

## 2024-05-23 ENCOUNTER — Encounter: Payer: Self-pay | Admitting: Neurology

## 2024-05-23 VITALS — BP 137/76 | HR 78 | Ht 63.0 in | Wt 183.0 lb

## 2024-05-23 DIAGNOSIS — R569 Unspecified convulsions: Secondary | ICD-10-CM

## 2024-05-23 DIAGNOSIS — R419 Unspecified symptoms and signs involving cognitive functions and awareness: Secondary | ICD-10-CM

## 2024-05-23 MED ORDER — LEVETIRACETAM 250 MG PO TABS: 250.0000 mg | ORAL_TABLET | Freq: Two times a day (BID) | ORAL | 4 refills | Status: DC

## 2024-05-23 NOTE — Progress Notes (Signed)
 [facility]  PATIENT: Anna [name] DOB: January 18, 1936  REQUESTING CLINICIAN: Alvan Dorothyann BIRCH, * HISTORY FROM: Patient REASON FOR VISIT: Cognitive impairment/ Event concerning for seizure   HISTORICAL  CHIEF COMPLAINT:  Chief Complaint  Patient presents with   Follow-up    RM 13   INTERVAL HISTORY 05/23/2024 Patient presents today for follow-up, last visit was in February.  At that time we obtained an EEG which show left frontotemporal slowing, with her episode concerning for seizure, we started her on Keppra  250 mg twice daily.  Since starting the Keppra , patient tells me that she has been doing very well, she feels good physically, has not had any additional episodes of confusion or seizure like activity.  Overall she is doing well, no additional questions or concerns.  She still lives in independent living facility, independent of all ADLs, tells me that she started using Prevagen.   HISTORY OF PRESENT ILLNESS:  This 88 year old woman past medical history of hypertension, hyperlipidemia, GERD, who is presenting with complaint of memory loss and events concerning for seizures.  In terms of the memory loss, patient tells me that his memory is not what it used to be.  Currently she resides at a independent living living facility.  She is independent in all activities of daily living.  She tells me that sometimes she is forgetful, she can walk into a room and forget the reason why she was going into that room.  Again other than that she is independent all ADLs. She also reports history of event that she described as word finding difficulty.  She tells me that she used to be a Runner, broadcasting/film/video and sometimes she has word finding difficulty and short-term memory.  Word finding difficulty sometimes when talking to her sister over the phone. She reports on a few occasions, she woke up with with night sweat and 1 specific occasion she felt like she was having a dream but she woke up  outside of her door confused not knowing how to open the door. The door was locked.  Her neighbor has to call the CNA who comes to open the door.  When they got into the room, they found blood on the pillow, blood on her nightgown and she had bruises in her forearm.  Not sure how that happened.  This was only a one-time event. She denies any previous history of seizures.  Denies any family history of dementia.   TBI:  No past history of TBI Stroke:   no past history of stroke Seizures:   no past history of seizures Sleep:   STOP BANG score   Mood: yes, takes medication Family history of Dementia: Denies  Functional status: independent in all ADLs and IADLs Patient lives at an indenpent living facility . Cooking: no Cleaning: no Shopping: no issues  Bathing: no issues  Toileting: no issues  Driving: no issues  Bills: no issues  Medications: no issues  Ever left the stove on by accident?: n/a Forget how to use items around the house?: n/a Getting lost going to familiar places?: denies  Forgetting loved ones names?: denies  Word finding difficulty? Yes  Sleep: Great   OTHER MEDICAL CONDITIONS: Hypertension, Hyperlipidemia, GERD,    REVIEW OF SYSTEMS: Full 14 system review of systems performed and negative with exception of: As noted in the HPI  ALLERGIES: Allergies  Allergen Reactions   Aleve [Naproxen Sodium]     Causes elevation in cholesterol    Lisinopril  Other (See Comments)  Cough   Azithromycin Rash    HOME MEDICATIONS: Outpatient Medications Prior to Visit  Medication Sig Dispense Refill   Apoaequorin (PREVAGEN) 10 MG CAPS Take by mouth.     bevacizumab  (AVASTIN ) 1.25 mg/0.1 mL SOLN 1.25 mg by Intravitreal route every 6 (six) weeks.     celecoxib  (CELEBREX ) 200 MG capsule Take 1 capsule (200 mg total) by mouth daily. 90 capsule 1   Cholecalciferol (VITAMIN D-1000 MAX ST) 25 MCG (1000 UT) tablet Take by mouth.     escitalopram  (LEXAPRO ) 5 MG tablet Take 1  tablet (5 mg total) by mouth every morning. 90 tablet 1   fluticasone -salmeterol (ADVAIR) 250-50 MCG/ACT AEPB Inhale 1 puff into the lungs 2 (two) times daily. 60 each 11   latanoprost (XALATAN) 0.005 % ophthalmic solution Place 1 drop into both eyes daily.     lovastatin (MEVACOR) 40 MG tablet TAKE ONE TABLET BY MOUTH NIGHTLY/ labs for further refills 90 tablet 0   meclizine  (ANTIVERT ) 25 MG tablet Take 1 tablet (25 mg total) by mouth 3 (three) times daily as needed for dizziness. 30 tablet 0   Multiple Vitamins-Minerals (PRESERVISION AREDS 2) CAPS Take by mouth.     olmesartan  (BENICAR ) 20 MG tablet Take 1 tablet (20 mg total) by mouth daily. 90 tablet 1   omeprazole  (PRILOSEC) 40 MG capsule Take 1 capsule (40 mg total) by mouth daily. 90 capsule 3   timolol (BETIMOL) 0.5 % ophthalmic solution Place 1 drop into the left eye 2 (two) times daily.     triamcinolone  cream (KENALOG ) 0.1 % Apply 1 Application topically 2 (two) times daily as needed. Do not use for more than 2 weeks at a time 30 g 0   TURMERIC PO Take 1,000 mg by mouth.     vitamin B-12 (CYANOCOBALAMIN) 1000 MCG tablet Take 1,000 mcg by mouth daily.      ammonium lactate (AMLACTIN) 12 % cream Apply topically as needed for dry skin.     levETIRAcetam  (KEPPRA ) 250 MG tablet Take 1 tablet (250 mg total) by mouth 2 (two) times daily. 60 tablet 6   Flaxseed Oil OIL 1,000 mg. (Patient not taking: Reported on 05/23/2024)     No facility-administered medications prior to visit.    PAST MEDICAL HISTORY: Past Medical History:  Diagnosis Date   Dermatitis    seborrehic scalp   Hiatal hernia    History of basal cell carcinoma 07/20/2017   On nose.    History of melanoma 07/20/2017   Right upper arm    Hyperlipidemia    Macular degeneration     PAST SURGICAL HISTORY: Past Surgical History:  Procedure Laterality Date   BREAST CYST EXCISION Right 09/1962   CATARACT EXTRACTION  03/2015   melonoma Right 06/2016   arm   MOHS SURGERY  Left 06/2016   nose    FAMILY HISTORY: Family History  Problem Relation Age of Onset   Cancer Mother        pancreatic   Cancer Father        liver   Hyperlipidemia Sister    Macular degeneration Sister    Atrial fibrillation Maternal Aunt    Cancer - Prostate Brother    Breast cancer Neg Hx     SOCIAL HISTORY: Social History   Socioeconomic History   Marital status: Single    Spouse name: Not on file   Number of children: 0   Years of education: 16   Highest education level: Master's degree (e.g.,  MA, MS, MEng, MEd, MSW, MBA)  Occupational History   Occupation: retired    Comment: Programmer, 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  Tobacco Use   Smoking status: Never   Smokeless tobacco: Never  Vaping Use   Vaping status: Never Used  Substance and Sexual Activity   Alcohol use: Not Currently   Drug use: No   Sexual activity: Never  Other Topics Concern   Not on file  Social History Narrative   She has a masters degree in creative arts.  She lives alone in a retirement community.  Her sister is Joann Lallier. Right handed   Social Drivers of Health   Financial Resource Strain: Low Risk  (09/05/2023)   Overall Financial Resource Strain (CARDIA)    Difficulty of Paying Living Expenses: Not hard at all  Food Insecurity: No Food Insecurity (09/05/2023)   Hunger Vital Sign    Worried About Running Out of Food in the Last Year: Never true    Ran Out of Food in the Last Year: Never true  Transportation Needs: No Transportation Needs (09/05/2023)   PRAPARE - Administrator, Civil Service (Medical): No    Lack of Transportation (Non-Medical): No  Physical Activity: Insufficiently Active (09/05/2023)   Exercise Vital Sign    Days of Exercise per Week: 7 days    Minutes of Exercise per Session: 10 min  Stress: No Stress Concern Present (09/05/2023)   [employer] of Occupational Health - Occupational Stress Questionnaire     Feeling of Stress : Only a little  Social Connections: Moderately Isolated (09/05/2023)   Social Connection and Isolation Panel    Frequency of Communication with Friends and Family: More than three times a week    Frequency of Social Gatherings with Friends and Family: More than three times a week    Attends Religious Services: More than 4 times per year    Active Member of [employer] or Organizations: No    Attends Banker Meetings: Never    Marital Status: Never married  Intimate Partner Violence: Not At Risk (09/05/2023)   Humiliation, Afraid, Rape, and Kick questionnaire    Fear of Current or Ex-Partner: No    Emotionally Abused: No    Physically Abused: No    Sexually Abused: No    PHYSICAL EXAM  GENERAL EXAM/CONSTITUTIONAL: Vitals:  Vitals:   05/23/24 0908  BP: 137/76  Pulse: 78  SpO2: 96%  Weight: 183 lb (83 kg)  Height: 5' 3 (1.6 m)   Body mass index is 32.42 kg/m. Wt Readings from Last 3 Encounters:  05/23/24 183 lb (83 kg)  02/02/24 176 lb 12 oz (80.2 kg)  11/09/23 171 lb (77.6 kg)   Patient is in no distress; well developed, nourished and groomed; neck is supple  MUSCULOSKELETAL: Gait, strength, tone, movements noted in Neurologic exam below  NEUROLOGIC: MENTAL STATUS:      No data to display             10/30/2023    3:04 PM 01/04/2023   10:28 AM  Montreal Cognitive Assessment   Visuospatial/ Executive (0/5) 4 4  Naming (0/3) 2 3  Attention: Read list of digits (0/2) 2 2  Attention: Read list of letters (0/1) 1 1  Attention: Serial 7 subtraction starting at 100 (0/3) 2 1  Language: Repeat phrase (0/2) 2 2  Language : Fluency (0/1) 1 1  Abstraction (0/2) 2 2  Delayed Recall (0/5) 5  3  Orientation (0/6) 6 6  Total 27 25  Adjusted Score (based on education)  25    CRANIAL NERVE:  2nd, 3rd, 4th, 6th- visual fields full to confrontation, extraocular muscles intact, no nystagmus 5th - facial sensation symmetric 7th - facial strength  symmetric 8th - hearing intact 9th - palate elevates symmetrically, uvula midline 11th - shoulder shrug symmetric 12th - tongue protrusion midline  MOTOR:  normal bulk and tone, full strength in the BUE, BLE  SENSORY:  normal and symmetric to light touch  COORDINATION:  finger-nose-finger, fine finger movements normal  GAIT/STATION:  normal   DIAGNOSTIC DATA (LABS, IMAGING, TESTING) - I reviewed patient records, labs, notes, testing and imaging myself where available.  Lab Results  Component Value Date   WBC 11.2 (H) 09/01/2023   HGB 11.8 09/01/2023   HCT 35.9 09/01/2023   MCV 86 09/01/2023   PLT 209 09/01/2023      Component Value Date/Time   NA 142 11/14/2023 1042   K 4.4 11/14/2023 1042   CL 104 11/14/2023 1042   CO2 22 11/14/2023 1042   GLUCOSE 82 11/14/2023 1042   GLUCOSE 98 01/04/2023 1058   BUN 15 11/14/2023 1042   CREATININE 0.90 11/14/2023 1042   CREATININE 0.76 01/04/2023 1058   CALCIUM 9.3 11/14/2023 1042   PROT 6.3 11/14/2023 1042   ALBUMIN 4.1 11/14/2023 1042   AST 29 11/14/2023 1042   ALT 10 11/14/2023 1042   ALKPHOS 89 11/14/2023 1042   BILITOT 0.8 11/14/2023 1042   GFRNONAA 72 05/25/2020 1631   GFRAA 83 05/25/2020 1631   Lab Results  Component Value Date   CHOL 144 02/07/2022   HDL 51 02/07/2022   LDLCALC 73 02/07/2022   TRIG 123 02/07/2022   CHOLHDL 2.8 02/07/2022   Lab Results  Component Value Date   HGBA1C 5.5 11/09/2023   Lab Results  Component Value Date   VITAMINB12 1,147 (H) 03/16/2020   Lab Results  Component Value Date   TSH 1.230 07/05/2023    MRI Brain 01/09/2023 1. Normal parenchymal volume without disproportionate lobar or hippocampal atrophy. 2. Small remote lacunar infarct in the right frontal white matter and moderate background chronic small-vessel ischemic change.  EEG 11/20/2023 Intermittent left frontotemporal focal slowing    ASSESSMENT AND PLAN  88 y.o. year old female with history of hypertension,  hyperlipidemia, GERD, who is presenting for follow up for seizure.  He is doing well on Keppra  250 mg twice daily, denies any side effect from the medication and has not had any additional events.  Plan will be for patient to continue with Keppra , she understand to contact us  if she does have any additional concerns.  I will see her in 1 year for follow-up or sooner if worse.  In terms of the memory complaint, she does live in an independent living facility, she is independent all ADLs, and her MoCA today is normal at 27.  Patient does have normal cognition.  At present, no additional neurological workup indicated. She can continue with Prevagen.    1. Seizures (HCC)   2. Cognitive complaints with normal exam      Patient Instructions  Continue with Keppra  250 mg twice daily, refill given Continue tour other medicationa Please contact us  if you do have another seizure Follow-up in 1 year or sooner if worse    No orders of the defined types were placed in this encounter.   Meds ordered this encounter  Medications   levETIRAcetam  (  KEPPRA ) 250 MG tablet    Sig: Take 1 tablet (250 mg total) by mouth 2 (two) times daily.    Dispense:  180 tablet    Refill:  4    Return in about 1 year (around 05/23/2025).    Pastor Falling, MD 05/23/2024, 9:37 AM  [facility] [address], [address] [place], [place] [address] [phone]

## 2024-05-23 NOTE — Patient Instructions (Addendum)
 Continue with Keppra  250 mg twice daily, refill given Continue tour other medicationa Please contact us  if you do have another seizure Follow-up in 1 year or sooner if worse

## 2024-05-23 NOTE — Progress Notes (Signed)
 Pharmacy Quality Measure Review  This patient is appearing on a report for being at risk of failing the adherence measure for hypertension (ACEi/ARB) medications this calendar year.   Medication: olmesartan  20 mg daily Last fill date: 04/16/2024 for 28 day supply  Insurance report was not up to date. No action needed at this time.   Woodie Jock, PharmD PGY1 Pharmacy Resident

## 2024-05-30 ENCOUNTER — Encounter: Payer: Self-pay | Admitting: Sports Medicine

## 2024-06-04 ENCOUNTER — Ambulatory Visit (INDEPENDENT_AMBULATORY_CARE_PROVIDER_SITE_OTHER): Admitting: Family Medicine

## 2024-06-04 ENCOUNTER — Encounter: Payer: Self-pay | Admitting: Family Medicine

## 2024-06-04 VITALS — BP 135/76 | HR 65 | Ht 62.0 in | Wt 181.0 lb

## 2024-06-04 DIAGNOSIS — I10 Essential (primary) hypertension: Secondary | ICD-10-CM

## 2024-06-04 DIAGNOSIS — J449 Chronic obstructive pulmonary disease, unspecified: Secondary | ICD-10-CM

## 2024-06-04 DIAGNOSIS — R7301 Impaired fasting glucose: Secondary | ICD-10-CM

## 2024-06-04 DIAGNOSIS — L603 Nail dystrophy: Secondary | ICD-10-CM

## 2024-06-04 DIAGNOSIS — Z23 Encounter for immunization: Secondary | ICD-10-CM

## 2024-06-04 DIAGNOSIS — J984 Other disorders of lung: Secondary | ICD-10-CM | POA: Diagnosis not present

## 2024-06-04 DIAGNOSIS — F418 Other specified anxiety disorders: Secondary | ICD-10-CM

## 2024-06-04 NOTE — Assessment & Plan Note (Signed)
 Depression Mood well-managed with Escitalopram  5 mg daily, patient satisfied with current dosage. - Continue Escitalopram  5 mg daily. - Reassess medication need after winter.

## 2024-06-04 NOTE — Progress Notes (Signed)
 Established Patient Office Visit  Subjective  Patient ID: Anna [name], female    DOB: [birth year]  Age: 88 y.o. MRN: [id number]  Chief Complaint  Patient presents with   Hypertension    HPI  Discussed the use of AI scribe software for clinical note transcription with the patient, who gave verbal consent to proceed.  History of Present Illness Anna [name] is an 88 year old female who presents with concerns about a discolored and ingrown toenail on her left foot.  Discolored and ingrown toenail - Discoloration present on the second toenail of the left foot, described as blue in color - Nail is curving inward, consistent with an ingrown appearance - No associated pain - No recollection of trauma to the area - Discoloration identified as being in the skin beneath the nail, not the nail itself, following a recent pedicure  Medication adherence and tolerance - Currently taking Keppra  twice daily for seizures - Taking Lexapro  5 mg in the morning for mood - Uses eye drops and a breathing medication twice daily - Taking all medications as prescribed - Medications are effective and well tolerated  Concerns regarding senior living facility - Resides at [facility], a senior living facility - Current monthly cost is $3,250, including meals and transportation services - Anticipates a rent increase to $3,500 in January - Concerned about financial ability to continue living at current facility - Exploring alternative senior housing options that provide meals and transportation - Does not cook and values the safety and community of her current residence      ROS    Objective:     BP 135/76   Pulse 65   Ht 5' 2 (1.575 m)   Wt 181 lb (82.1 kg)   SpO2 95%   BMI 33.11 kg/m    Physical Exam Vitals and nursing note reviewed.  Constitutional:      Appearance: Normal appearance.  HENT:     Head: Normocephalic and atraumatic.  Eyes:     Conjunctiva/sclera:  Conjunctivae normal.  Cardiovascular:     Rate and Rhythm: Normal rate and regular rhythm.  Pulmonary:     Effort: Pulmonary effort is normal.     Breath sounds: Normal breath sounds.  Skin:    General: Skin is warm and dry.  Neurological:     Mental Status: She is alert.  Psychiatric:        Mood and Affect: Mood normal.      No results found for any visits on 06/04/24.    The ASCVD Risk score (Arnett DK, et al., 2019) failed to calculate for the following reasons:   The 2019 ASCVD risk score is only valid for ages 3 to 43    Assessment & Plan:   Problem List Items Addressed This Visit       Cardiovascular and Mediastinum   Primary hypertension - Primary   Well controlled. Continue current regimen. Follow up in  6 mo       Relevant Orders   CMP14+EGFR   Lipid panel   HgB A1c     Respiratory   Mixed restrictive and obstructive lung disease (HCC)   Doing well. No receetn flares.  She is paying almost $50 for her inhaler.       Relevant Orders   CMP14+EGFR   Lipid panel   HgB A1c     Endocrine   IFG (impaired fasting glucose)   Due for A1C. Maintain healthy weight      Relevant Orders  CMP14+EGFR   Lipid panel   HgB A1c     Other   Depression with anxiety   Depression Mood well-managed with Escitalopram  5 mg daily, patient satisfied with current dosage. - Continue Escitalopram  5 mg daily. - Reassess medication need after winter.      Other Visit Diagnoses       Encounter for immunization       Relevant Orders   Flu vaccine HIGH DOSE PF(Fluzone Trivalent) (Completed)     Dystrophic nail       Relevant Orders   Ambulatory referral to Podiatry      Assessment and Plan Assessment & Plan Dystrophic nail/Possible onychomycosis or traumatic injury of left second toenail Left second toenail discolored and curving inward, likely fungal infection due to absence of trauma. - Refer to podiatrist Dr. Sikora for evaluation and possible culture. -  Consider toenail removal if necessary. - Discuss oral antifungal treatment if culture confirms fungal infection.   Chronic obstructive pulmonary disease (COPD) Managing COPD with current medications, concerns about inhaler costs. - Clinical pharmacist to review inhaler medications for financial assistance programs.   Return in about 6 months (around 12/02/2024) for bp/ifg.    Dorothyann Byars, MD

## 2024-06-04 NOTE — Assessment & Plan Note (Signed)
 Well controlled. Continue current regimen. Follow up in  6 mo

## 2024-06-04 NOTE — Assessment & Plan Note (Signed)
 Doing well. No receetn flares.  She is paying almost $50 for her inhaler.

## 2024-06-04 NOTE — Assessment & Plan Note (Signed)
 Due for A1C. Maintain healthy weight

## 2024-06-05 ENCOUNTER — Ambulatory Visit: Payer: Self-pay | Admitting: Family Medicine

## 2024-06-05 LAB — LIPID PANEL
Chol/HDL Ratio: 3.7 ratio (ref 0.0–4.4)
Cholesterol, Total: 153 mg/dL (ref 100–199)
HDL: 41 mg/dL (ref >39)
LDL Chol Calc (NIH): 78 mg/dL (ref 0–99)
Triglycerides: 200 mg/dL — ABNORMAL HIGH (ref 0–149)
VLDL Cholesterol Cal: 34 mg/dL (ref 5–40)

## 2024-06-05 LAB — CMP14+EGFR
ALT: 9 IU/L (ref 0–32)
AST: 33 IU/L (ref 0–40)
Albumin: 4.2 g/dL (ref 3.7–4.7)
Alkaline Phosphatase: 89 IU/L (ref 44–121)
BUN/Creatinine Ratio: 14 (ref 12–28)
BUN: 14 mg/dL (ref 8–27)
Bilirubin Total: 0.6 mg/dL (ref 0.0–1.2)
CO2: 23 mmol/L (ref 20–29)
Calcium: 9.4 mg/dL (ref 8.7–10.3)
Chloride: 101 mmol/L (ref 96–106)
Creatinine, Ser: 1 mg/dL (ref 0.57–1.00)
Globulin, Total: 1.7 g/dL (ref 1.5–4.5)
Glucose: 79 mg/dL (ref 70–99)
Potassium: 4.6 mmol/L (ref 3.5–5.2)
Sodium: 138 mmol/L (ref 134–144)
Total Protein: 5.9 g/dL — ABNORMAL LOW (ref 6.0–8.5)
eGFR: 55 mL/min/1.73 — ABNORMAL LOW (ref >59)

## 2024-06-05 LAB — HEMOGLOBIN A1C
Est. average glucose Bld gHb Est-mCnc: 123 mg/dL
Hgb A1c MFr Bld: 5.9 % — ABNORMAL HIGH (ref 4.8–5.6)

## 2024-06-05 NOTE — Progress Notes (Signed)
 Patient: Triglycerides are up.  LDL looks good.  Just continue to work on healthy diet and regular exercise.  A1c went up just slightly to 5.9 still in the prediabetes range but she had brought it down to 5.5 last time still continue to work on increasing vegetable intake and decreasing carbs and sugars.  Kidney function is stable.  Liver enzymes are normal.  Routine levels a little bit on the lower end so also just make sure getting an adequate protein intake daily.

## 2024-06-05 NOTE — Addendum Note (Signed)
 Addended by: Jojo Pehl D on: 06/05/2024 07:36 AM   Modules accepted: Level of Service

## 2024-06-07 ENCOUNTER — Encounter: Payer: Self-pay | Admitting: Podiatry

## 2024-06-07 ENCOUNTER — Ambulatory Visit (INDEPENDENT_AMBULATORY_CARE_PROVIDER_SITE_OTHER): Admitting: Podiatry

## 2024-06-07 DIAGNOSIS — L608 Other nail disorders: Secondary | ICD-10-CM | POA: Diagnosis not present

## 2024-06-07 DIAGNOSIS — L603 Nail dystrophy: Secondary | ICD-10-CM | POA: Diagnosis not present

## 2024-06-07 NOTE — Patient Instructions (Signed)

## 2024-06-07 NOTE — Progress Notes (Signed)
  Subjective:  Patient ID: Anna [name], female    DOB: 05-Oct-1935,   MRN: [id number]  Chief Complaint  Patient presents with   Nail Problem    It's my left foot, the second toe, it's black.    88 y.o. female presents for concern of discoloration of left second toenail that started two months ago. She does not recall any injury. Denies any pain with toe toe. Relates it has not improved. Sent here by Dr. Alvan  . Denies any other pedal complaints. Denies n/v/f/c.   Past Medical History:  Diagnosis Date   Dermatitis    seborrehic scalp   Hiatal hernia    History of basal cell carcinoma 07/20/2017   On nose.    History of melanoma 07/20/2017   Right upper arm    Hyperlipidemia    Macular degeneration     Objective:  Physical Exam: Vascular: DP/PT pulses 2/4 bilateral. CFT <3 seconds. Normal hair growth on digits. No edema.  Skin. No lacerations or abrasions bilateral feet. Left second digit nail incurvated bialteral and black discoloration underlying possible hemorrhage but discolroation does extend to proximal nail fold and eponychium concerning for pigmentation.  Musculoskeletal: MMT 5/5 bilateral lower extremities in DF, PF, Inversion and Eversion. Deceased ROM in DF of ankle joint.  Neurological: Sensation intact to light touch.   Assessment:   1. Melanonychia      Plan:  Patient was evaluated and treated and all questions answered. -Examined patient -Discussed treatment options for discolored nails. Discussed trauma and hemorrhage vs pigmentation and melonychia -Clinical picture and Fungal culture as well as fontana masson stain was obtained by removing  the hard nail itself from  the second toe nail as below.  -Discussed fungal nail treatment options including oral, topical, and laser treatments. Depending on cultures  -Patient to return in 3 weeks for follow up evaluation and discussion of fungal culture results or sooner if symptoms worsen.    Procedure:   Procedure: total Nail Avulsion of left second digit nail  Surgeon: Asberry Failing, DPM  Pre-op Dx: Arthurine.  Post-op: Same  Place of Surgery: Office exam room.  Indications for surgery: Discoloration of toenail concern for melanonychia vs melanoma.    The patient is requesting removal of nail without  chemical matrixectomy. Risks and complications were discussed with the patient for which they understand and written consent was obtained. Under sterile conditions a total of 3 mL of  1% lidocaine plain was infiltrated in a hallux block fashion. Once anesthetized, the skin was prepped in sterile fashion. A tourniquet was then applied. Next the  entire left second digit nail was removed and copiously irrigated. The nail was sent for pathology and culture. Silvadene was applied. A dry sterile dressing was applied. After application of the dressing the tourniquet was removed and there is found to be an immediate capillary refill time to the digit. The patient tolerated the procedure well without any complications. Post procedure instructions were discussed the patient for which he verbally understood. Follow-up in two weeks for nail check or sooner if any problems are to arise. Discussed signs/symptoms of infection and directed to call the office immediately should any occur or go directly to the emergency room. In the meantime, encouraged to call the office with any questions, concerns, changes symptoms.   Asberry Failing, DPM

## 2024-06-07 NOTE — Addendum Note (Signed)
 Addended by: WAYLAN ELIDIA PARAS on: 06/07/2024 11:37 AM   Modules accepted: Orders

## 2024-06-13 ENCOUNTER — Other Ambulatory Visit: Payer: Self-pay | Admitting: Podiatry

## 2024-06-18 DIAGNOSIS — H401131 Primary open-angle glaucoma, bilateral, mild stage: Secondary | ICD-10-CM | POA: Diagnosis not present

## 2024-07-04 ENCOUNTER — Encounter: Payer: Self-pay | Admitting: Podiatry

## 2024-07-04 ENCOUNTER — Ambulatory Visit: Admitting: Podiatry

## 2024-07-04 DIAGNOSIS — L603 Nail dystrophy: Secondary | ICD-10-CM | POA: Diagnosis not present

## 2024-07-04 NOTE — Progress Notes (Signed)
  Subjective:  Patient ID: Anna [name], female    DOB: 11-11-1935,   MRN: [id number]  Chief Complaint  Patient presents with   Nail Problem    Everything worked out well.  I didn't have any pain.    88 y.o. female presents for follow-up of concern for nail discoloration and removal of nail.Anna [name] Here to review results as well.   . Denies any other pedal complaints. Denies n/v/f/c.   Past Medical History:  Diagnosis Date   Dermatitis    seborrehic scalp   Hiatal hernia    History of basal cell carcinoma 07/20/2017   On nose.    History of melanoma 07/20/2017   Right upper arm    Hyperlipidemia    Macular degeneration     Objective:  Physical Exam: Vascular: DP/PT pulses 2/4 bilateral. CFT <3 seconds. Normal hair growth on digits. No edema.  Skin. No lacerations or abrasions bilateral feet. Left second digit nail bed well healed.  Musculoskeletal: MMT 5/5 bilateral lower extremities in DF, PF, Inversion and Eversion. Deceased ROM in DF of ankle joint.  Neurological: Sensation intact to light touch.   Assessment:   1. Onychodystrophy       Plan:  Patient was evaluated and treated and all questions answered. Toe was evaluated and appears to be healing well.  May discontinue soaks and neosporin.  Pathology reviewed for nail and hemorrhage noted underneath the nail no concern for malignant changes.  Patient to follow-up as needed.     Asberry Failing, DPM

## 2024-09-06 ENCOUNTER — Telehealth: Payer: Self-pay

## 2024-09-06 NOTE — Progress Notes (Signed)
° °  09/06/2024  Patient ID: Anna [name], female   DOB: 1936-09-23, 88 y.o.   MRN: [id number]  This patient is appearing on a report for being at risk of failing the adherence measure for cholesterol (statin) and hypertension (ACEi/ARB) medications this calendar year.   Medication: olmesartan  20mg , lovastatin 40mg   Last fill date: 12/9 for 7 day suppllies  Contacted the pharmacy, and they fill patient's maintenance medications and deliver to assisted living facility every 7 days and state insurance is billed each time; however, Dr. Annemarie does not reflect these fills/claims.  Patient is adherent to medication therapies, though.  [name], PharmD, DPLA

## 2024-10-08 ENCOUNTER — Telehealth: Payer: Self-pay

## 2024-10-08 ENCOUNTER — Other Ambulatory Visit: Payer: Self-pay | Admitting: Family Medicine

## 2024-10-08 MED ORDER — FLUTICASONE-SALMETEROL 250-50 MCG/ACT IN AEPB: 1.0000 | INHALATION_SPRAY | Freq: Two times a day (BID) | RESPIRATORY_TRACT | 11 refills | Status: DC

## 2024-10-08 NOTE — Telephone Encounter (Signed)
 Copied from CRM [id number]. Topic: Clinical - Prescription Issue >> Oct 08, 2024  9:46 AM Winona R wrote: Pt would like to know if a less expensive substitute can be sent for fluticasone -salmeterol (ADVAIR) 250-50 MCG/ACT AEPB- to  [facility] [place], [place] - [address] [place] [address] Phone: [phone] Fax: [phone] Hours: Not open 24 hours

## 2024-10-08 NOTE — Progress Notes (Signed)
 Meds ordered this encounter  Medications   fluticasone -salmeterol (WIXELA INHUB) 250-50 MCG/ACT AEPB    Sig: Inhale 1 puff into the lungs in the morning and at bedtime.    Dispense:  60 each    Refill:  11

## 2024-10-08 NOTE — Telephone Encounter (Signed)
 We can see if Anna [name] is cheaper. I can send a new script but she will have to let us  know if the price isn't cheaper.    Meds ordered this encounter  Medications   fluticasone -salmeterol (WIXELA INHUB) 250-50 MCG/ACT AEPB    Sig: Inhale 1 puff into the lungs in the morning and at bedtime.    Dispense:  60 each    Refill:  11   If not, then may have to check with her insurance to see what is covered this year.

## 2024-10-08 NOTE — Telephone Encounter (Signed)
 Patient is calling to check status of this prescription. She stated that she only has one puff left and she will have to use that tonight and that her medication will be delivered tomorrow 10/09/24.

## 2024-10-09 NOTE — Telephone Encounter (Signed)
 Patient informed and will check with pharmacy regarding cost of Wixela. She will reach out to let us  know if this is still too expensive. Gave main # and direct # for return call.

## 2024-10-11 ENCOUNTER — Telehealth: Payer: Self-pay

## 2024-10-11 DIAGNOSIS — J449 Chronic obstructive pulmonary disease, unspecified: Secondary | ICD-10-CM

## 2024-10-11 NOTE — Telephone Encounter (Signed)
 The Wixela inhaler is similar in pricing as the last inhaler. The patient has been notified to contact her insurance directly and see what is covered at a lower cost for inhalers. The patient plans to return a call back to the clinic with the preferred inhaler rx information once she has it available.

## 2024-10-11 NOTE — Telephone Encounter (Signed)
 Copied from CRM [id number]. Topic: Clinical - Prescription Issue >> Oct 08, 2024  9:46 AM Winona R wrote: Pt would like to know if a less expensive substitute can be sent for fluticasone -salmeterol (ADVAIR) 250-50 MCG/ACT AEPB- to  [facility] - [place], [place] - [address] [place] [address] Phone: [phone] Fax: [phone] Hours: Not open 24 hours >> Oct 11, 2024  1:01 PM Mercer PEDLAR wrote: Patient stated that it is the same price of $293 at [facility] and she can't afford that. Patient is requesting a callback to discuss other options.

## 2024-10-11 NOTE — Telephone Encounter (Signed)
 There is already a phone note in this regard.  I already sent in a new prescription for Wixela which is the generic of Advair.  So I do not know if this phone call is in regard to the [facility] or the first phone call which was the Advair.  If these are the same cost then she is really just can have to call her insurance and find out what is cheaper.  Otherwise I have absolutely no idea.

## 2024-10-15 ENCOUNTER — Other Ambulatory Visit (HOSPITAL_COMMUNITY): Payer: Self-pay

## 2024-10-15 NOTE — Telephone Encounter (Addendum)
 Pharmacy Patient Advocate Encounter   Received notification from Physician's Office that prior authorization for [facility] is required/requested.   Insurance verification completed.   The patient is insured through [employer].   Per test claim: The current 30 day co-pay is, $87.69.  No PA needed at this time. This test claim was processed through [facility]- copay amounts may vary at other pharmacies due to pharmacy/plan contracts, or as the patient moves through the different stages of their insurance plan.

## 2024-10-17 NOTE — Addendum Note (Signed)
 Addended by: Shambhavi Salley P on: 10/17/2024 02:18 PM   Modules accepted: Orders

## 2024-10-17 NOTE — Telephone Encounter (Signed)
 Referral placed and patient informed to expect the call from the pharmacy assistance team

## 2024-10-18 ENCOUNTER — Telehealth: Payer: Self-pay | Admitting: Family Medicine

## 2024-10-18 NOTE — Progress Notes (Unsigned)
 Care Guide Pharmacy Note  10/18/2024 Name: Anna [name] MRN: [id number] DOB: [birth year]  Referred By: Alvan Dorothyann BIRCH, MD Reason for referral: Call Attempt #1 and Complex Care Management (Outreach to sch ref w/ pharm)   Anna [name] is a 89 y.o. year old female who is a primary care patient of Metheney, Dorothyann BIRCH, MD.  Anna [name] was referred to the pharmacist for assistance related to: COPD  An unsuccessful telephone outreach was attempted today to contact the patient who was referred to the pharmacy team for assistance with medication management. Additional attempts will be made to contact the patient.  Doyce Razor [facility], [facility] Guide Direct Dial: [phone]  Fax: [phone]

## 2024-10-18 NOTE — Progress Notes (Signed)
 Care Guide Pharmacy Note  10/18/2024 Name: Anna [name] MRN: [id number] DOB: 07-01-1936  Referred By: Alvan Dorothyann BIRCH, MD Reason for referral: Call Attempt #2 and Complex Care Management (Outreach to sch ref w/ pharm)   Anna [name] is a 89 y.o. year old female who is a primary care patient of Alvan Dorothyann BIRCH, MD.  Anna [name] was referred to the pharmacist for assistance related to: Medication Assistance   Successful contact was made with the patient to discuss pharmacy services including being ready for the pharmacist to call at least 5 minutes before the scheduled appointment time and to have medication bottles and any blood pressure readings ready for review. The patient agreed to meet with the pharmacist via telephone visit on 10/22/2024.  Doyce Razor [facility], [facility] Guide Direct Dial: [phone]  Fax: [phone]

## 2024-10-22 ENCOUNTER — Other Ambulatory Visit (INDEPENDENT_AMBULATORY_CARE_PROVIDER_SITE_OTHER): Payer: Self-pay

## 2024-10-22 DIAGNOSIS — J449 Chronic obstructive pulmonary disease, unspecified: Secondary | ICD-10-CM

## 2024-10-22 DIAGNOSIS — E782 Mixed hyperlipidemia: Secondary | ICD-10-CM

## 2024-10-22 DIAGNOSIS — J4540 Moderate persistent asthma, uncomplicated: Secondary | ICD-10-CM

## 2024-10-22 DIAGNOSIS — I10 Essential (primary) hypertension: Secondary | ICD-10-CM

## 2024-10-22 NOTE — Progress Notes (Unsigned)
 "  10/22/2024 Name: Nichele Slawson MRN: [id number] DOB: [birth year]  Chief Complaint  Patient presents with   Medication Assistance   Maribeth Jiles is a 89 y.o. year old female who presented for a telephone visit.   They were referred to the pharmacist by their PCP for assistance in managing medication access.   Subjective:  Care Team: Primary Care Provider: Alvan Dorothyann BIRCH, MD ; Next Scheduled Visit: 12/02/24  Medication Access/Adherence  Current Pharmacy:  [facility] [address] [place] [address] Phone: [phone] Fax: [phone]  Patient reports affordability concerns with their medications: Yes  Patient reports access/transportation concerns to their pharmacy: Yes  Patient reports adherence concerns with their medications:  Yes    Mixed Obstructive and Restrictive Lung Disease: Current medications:  none -Medications tried in the past: previously using (and responding well to) Advair 250/50mcg (or generic alternative) 1 puff BID, but has been without for 2 weeks based on cost -Patient states inhaler was around $50/month, but cost has gone up to almost $90 at the first of the year- this is not affordable for her -Does not have albuterol  rescue inhaler on hand -Reports 0 exacerbations in the past year and does not endorse any current SOB, coughing, or wheezing  Office Spirometry Results: -no recent results  Medication Management: Current adherence strategy: receives weekly pill packs delivered to her home from [facility] -Patient reports Good adherence to medications -Patient reports the following barriers to adherence: cost of Advair/Wixela, and she is moving Thursday and will no longer get weekly pill packs delivered from [facility].  She would be due for refills of her medications Wednesday. -Recent fill dates:  Latanoprost drops filled 1/6 for 30 day supply Timolol drops filled 1/6 for  25 day supply Celecoxib  200mg  filled 12/16 for 28 day supply Escitalopram  5mg  filled 12/16 for 28 day supply Levetiracetam  250mg  filled 12/16 for 28 day supply Lovastatin  40mg  filled 12/16 for 28 day supply Olmesartan  20mg  daily filled 12/16 for 28 day supply Omeprazole  40mg  filled 12/16 for 28 day supply Wixela 250/10mcg filled 12/9 for 30 day supply   Objective:  Medications Reviewed Today     Reviewed by Deanna Channing LABOR, [facility] (Pharmacist) on 10/22/24 at 0845  Med List Status: <None>   Medication Order Taking? Sig Documenting Provider Last Dose Status Informant  Apoaequorin (PREVAGEN) 10 MG CAPS [id number]  Take by mouth. [provider]  Active   bevacizumab  (AVASTIN ) 1.25 mg/0.1 mL SOLN [id number]  1.25 mg by Intravitreal route every 6 (six) weeks. [provider]  Active            Med Note MELITA RIDDLES A   Mon Oct 30, 2023  3:01 PM) Last injection received in Oct/2024  celecoxib  (CELEBREX ) 200 MG capsule [id number] Yes Take 1 capsule (200 mg total) by mouth daily. Alvan Dorothyann BIRCH, MD  Active   Cholecalciferol (VITAMIN D-1000 MAX ST) 25 MCG (1000 UT) tablet [id number]  Take by mouth. [provider]  Active   escitalopram  (LEXAPRO ) 5 MG tablet [id number] Yes Take 1 tablet (5 mg total) by mouth every morning. Alvan Dorothyann BIRCH, MD  Active   fluticasone -salmeterol GARETH INHUB) 250-50 MCG/ACT AEPB [id number]  Inhale 1 puff into the lungs in the morning and at bedtime. Alvan Dorothyann BIRCH, MD  Active   latanoprost (XALATAN) 0.005 % ophthalmic solution [id number] Yes Place 1 drop into both eyes daily. [provider]  Active   levETIRAcetam  (KEPPRA )  250 MG tablet [id number] Yes Take 1 tablet (250 mg total) by mouth 2 (two) times daily. Camara, Amadou, MD  Active   lovastatin  (MEVACOR ) 40 MG tablet [id number] Yes TAKE ONE TABLET BY MOUTH NIGHTLY/ labs for further refills Alvan Dorothyann BIRCH, MD  Active     Discontinued 10/22/24 0843   Multiple  Vitamins-Minerals (PRESERVISION AREDS 2) CAPS [id number]  Take by mouth. [provider]  Active   olmesartan  (BENICAR ) 20 MG tablet [id number] Yes Take 1 tablet (20 mg total) by mouth daily. Alvan Dorothyann BIRCH, MD  Active   omeprazole  (PRILOSEC) 40 MG capsule [id number] Yes Take 1 capsule (40 mg total) by mouth daily. Alvan Dorothyann BIRCH, MD  Active   timolol (BETIMOL) 0.5 % ophthalmic solution [id number] Yes Place 1 drop into the left eye 2 (two) times daily. [provider]  Active   triamcinolone  cream (KENALOG ) 0.1 % [id number]  Apply 1 Application topically 2 (two) times daily as needed. Do not use for more than 2 weeks at a time Alvan Dorothyann BIRCH, MD  Active   TURMERIC PO [id number]  Take 1,000 mg by mouth. [provider]  Active   vitamin B-12 (CYANOCOBALAMIN) 1000 MCG tablet [id number]  Take 1,000 mcg by mouth daily.  [provider]  Active            Assessment/Plan:   Mixed Obstructive and Restrictive Lung Disease: -Currently moderately controlled -Recommend resuming daily maintenance inhaler to prevent SOB/cough/wheeze.  Patient's plan has a deductible that has not been met, so currently even generic options are not affordable, with Wixela being the cheapest at $87.69 -Does not qualify for LIS Medicare Extra Help based on HHI -Does not qualify for PAP for Wixela based on having insurance coverage -No grants currently open to assist with affordability of inhalers -Patient would qualify for AZ&Me PAP, which offers both Bevespi and Breztri.  Neither of these are equivalent to [facility] (which contains a corticosteroid and beta agonist) that she has been using- Bevespi contains an anticholinergic and beta agonist but no corticosteroid; Breztri contains an anticholinergic, beta agonist, and corticosteroid.  Consulting with PCP to determine if either of this options would be appropriate for the patient; and if so, I will coordinate with the medication  assistance team to start the application process with AZ&Me. -I recommend patient have albuterol  HFA inhaler on hand, especially while currently out of daily maintenance inhaler.  Test claim reflects $5 copay, so order is pending for PCP to sign if in agreement.     Medication Management: -Currently strategy insufficient to maintain appropriate adherence to prescribed medication regimen -Reviewed list of medication, indication, and administration with the patient -Discussed collaboration with local pharmacies for adherence packaging and home delivery of medications. Patient elects to transfer medications to [facility], so she can receive in pill packs via home delivery (at no cost for these services). -I have updated her contact information on file to reflect her new address and phone number (moving 1/29) and updated her preferred pharmacy on file to [facility] -Coordinating with PharmD to transfer over all active prescriptions, set patient up for pill packs and home delivery, and see if medications can be filled and sent to patient Thursday or Friday since she would have been due for a delivery on Wednesday from previous pharmacy  Follow Up Plan: Will follow-up with patient in regard to maintenance inhaler plan as well as when to expect medication delivery  [name], PharmD, DPLA    "

## 2024-10-23 ENCOUNTER — Other Ambulatory Visit: Payer: Self-pay

## 2024-10-23 ENCOUNTER — Other Ambulatory Visit (HOSPITAL_COMMUNITY): Payer: Self-pay

## 2024-10-23 DIAGNOSIS — M150 Primary generalized (osteo)arthritis: Secondary | ICD-10-CM

## 2024-10-23 DIAGNOSIS — M19012 Primary osteoarthritis, left shoulder: Secondary | ICD-10-CM

## 2024-10-23 DIAGNOSIS — K219 Gastro-esophageal reflux disease without esophagitis: Secondary | ICD-10-CM

## 2024-10-23 DIAGNOSIS — F418 Other specified anxiety disorders: Secondary | ICD-10-CM

## 2024-10-23 DIAGNOSIS — I10 Essential (primary) hypertension: Secondary | ICD-10-CM

## 2024-10-23 DIAGNOSIS — M1711 Unilateral primary osteoarthritis, right knee: Secondary | ICD-10-CM

## 2024-10-23 MED ORDER — ALBUTEROL SULFATE HFA 108 (90 BASE) MCG/ACT IN AERS
2.0000 | INHALATION_SPRAY | Freq: Four times a day (QID) | RESPIRATORY_TRACT | 2 refills | Status: AC | PRN
  Filled 2024-10-23: qty 6.7, 30d supply, fill #0

## 2024-10-23 NOTE — Progress Notes (Unsigned)
" ° °  10/23/2024  Patient ID: Anna [name], female   DOB: 10/06/1935, 89 y.o.   MRN: [id number]  Working to get patient set up with adherence packaging and home delivery of medications.  [facility] unable to get transfers from [facility] and will need new prescriptions sent.  Orders pending and routed to prescribers to sign if in agreement.  Will notify patient to reach provider for eye drops to request refills.  [name], PharmD, DPLA  "

## 2024-10-24 ENCOUNTER — Other Ambulatory Visit: Payer: Self-pay

## 2024-10-24 ENCOUNTER — Telehealth: Payer: Self-pay

## 2024-10-24 ENCOUNTER — Other Ambulatory Visit (HOSPITAL_COMMUNITY): Payer: Self-pay

## 2024-10-24 ENCOUNTER — Ambulatory Visit: Payer: Self-pay | Admitting: Pharmacist

## 2024-10-24 MED ORDER — LOVASTATIN 40 MG PO TABS
40.0000 mg | ORAL_TABLET | Freq: Every day | ORAL | 11 refills | Status: AC
  Filled 2024-10-24: qty 30, 30d supply, fill #0

## 2024-10-24 MED ORDER — ESCITALOPRAM OXALATE 5 MG PO TABS
5.0000 mg | ORAL_TABLET | Freq: Every morning | ORAL | 11 refills | Status: AC
  Filled 2024-10-24: qty 30, 30d supply, fill #0

## 2024-10-24 MED ORDER — LEVETIRACETAM 250 MG PO TABS
250.0000 mg | ORAL_TABLET | Freq: Two times a day (BID) | ORAL | 7 refills | Status: AC
  Filled 2024-10-24: qty 60, 30d supply, fill #0

## 2024-10-24 MED ORDER — OLMESARTAN MEDOXOMIL 20 MG PO TABS
20.0000 mg | ORAL_TABLET | Freq: Every day | ORAL | 11 refills | Status: AC
  Filled 2024-10-24: qty 30, 30d supply, fill #0

## 2024-10-24 MED ORDER — OMEPRAZOLE 40 MG PO CPDR
40.0000 mg | DELAYED_RELEASE_CAPSULE | Freq: Every day | ORAL | 11 refills | Status: AC
  Filled 2024-10-24: qty 30, 30d supply, fill #0

## 2024-10-24 MED ORDER — CELECOXIB 200 MG PO CAPS
200.0000 mg | ORAL_CAPSULE | Freq: Every day | ORAL | 5 refills | Status: AC
  Filled 2024-10-24: qty 30, 30d supply, fill #0

## 2024-10-24 NOTE — Addendum Note (Signed)
 Addended by: DEANNA ROSELLA A on: 10/24/2024 09:34 AM   Modules accepted: Orders

## 2024-10-24 NOTE — Progress Notes (Signed)
" ° °  10/24/2024  Patient ID: Anna [name], female   DOB: December 05, 1935, 89 y.o.   MRN: [id number]  Outreach attempt to follow-up with patient in regard to home delivery of medications from [facility] and affordability of inhaler.  [facility] is needing card information for copays, so they can send her medications out to her.  She will need to request refills for her eyedrops be sent to [facility] by prescriber for those medications.  We have sent an albuterol  inhaler in that will be delivered along with her other medications while we work on PAP for [employer].  I was not able to reach the patient nor leave a voicemail, so I will attempt to reach her again tomorrow.  [name], PharmD, DPLA  "

## 2024-10-25 ENCOUNTER — Telehealth: Payer: Self-pay

## 2024-10-25 ENCOUNTER — Other Ambulatory Visit: Payer: Self-pay

## 2024-10-25 ENCOUNTER — Other Ambulatory Visit (HOSPITAL_COMMUNITY): Payer: Self-pay

## 2024-10-25 NOTE — Progress Notes (Signed)
" ° °  10/25/2024  Patient ID: Anna [name], female   DOB: [birth year], 89 y.o.   MRN: [id number]  Outreach attempt to follow-up with patient in regard to home delivery of medications from [facility] and affordability of inhaler.  [facility] is needing card information for copays, so they can send her medications out to her.  She will need to request refills for her eyedrops be sent to [facility] by prescriber for those medications.  We have sent an albuterol  inhaler in that will be delivered along with her other medications while we work on PAP for [employer].  I was not able to reach the patient, but I was able to leave a HIPAA compliant voicemail on her and her sister's (listed on DPR) numbers along with my direct number.  I will attempt to contact again Monday if I do not hear back.   [name], PharmD, DPLA  "

## 2024-10-28 ENCOUNTER — Telehealth: Payer: Self-pay

## 2024-10-28 ENCOUNTER — Other Ambulatory Visit: Payer: Self-pay

## 2024-10-28 NOTE — Progress Notes (Signed)
" ° °  10/28/2024  Patient ID: Anna [name], female   DOB: 1935-12-24, 89 y.o.   MRN: [id number]  Outreach to follow-up with patient in regard to home delivery of medications from [facility] and affordability of inhaler.  [facility] is needing card information for copays, so they can send her medications out to her- I have provided PharmD with patients new number to reach her on today. Made Anna [name] will need to request refills for her eyedrops be sent to [facility] by prescriber for those medications.  I also informed her that we have sent an albuterol  inhaler in that will be delivered along with her other medications while we work on PAP for Bevespi, and that the PAP application for this will be mailed to her new address.  [facility] did not have time to do adherence packs for this months fills since she was running out so soon; she will receive these in bottles this month.  She endorsed understanding of all above information.  I will check in with her again in 2 weeks to see if PAP application for Bevespi received and how she is doing with albuterol  HFA inhaler PRN.  [name], PharmD, DPLA  "

## 2024-10-29 ENCOUNTER — Other Ambulatory Visit (HOSPITAL_COMMUNITY): Payer: Self-pay

## 2024-10-29 MED ORDER — LATANOPROST 0.005 % OP SOLN
1.0000 [drp] | Freq: Every day | OPHTHALMIC | 4 refills | Status: AC
  Filled 2024-10-29: qty 2.5, 50d supply, fill #0

## 2024-10-29 MED ORDER — TIMOLOL MALEATE 0.5 % OP SOLN
1.0000 [drp] | Freq: Two times a day (BID) | OPHTHALMIC | 4 refills | Status: AC
  Filled 2024-10-29: qty 10, 100d supply, fill #0

## 2024-10-30 ENCOUNTER — Other Ambulatory Visit: Payer: Self-pay

## 2024-10-31 ENCOUNTER — Telehealth: Payer: Self-pay | Admitting: Family Medicine

## 2024-10-31 DIAGNOSIS — R413 Other amnesia: Secondary | ICD-10-CM

## 2024-10-31 DIAGNOSIS — K148 Other diseases of tongue: Secondary | ICD-10-CM

## 2024-10-31 NOTE — Telephone Encounter (Signed)
 Copied from CRM [id number]. Topic: Referral - Request for Referral >> Oct 31, 2024 12:05 PM Myrick T wrote: Did the patient discuss referral with their provider in the last year? Yes  Appointment offered? No  Type of order/referral and detailed reason for visit: Neurologist  Preference of office, provider, location: in [place] as she no longer has transportation to get to [place] at [facility] [employer]. Preferably Dr Dannielle in [place].  If referral order, have you been seen by this specialty before? Yes [facility]  Can we respond through MyChart? No

## 2024-10-31 NOTE — Telephone Encounter (Signed)
 Referral placed

## 2024-10-31 NOTE — Telephone Encounter (Signed)
 Ok for new referral?

## 2024-11-01 ENCOUNTER — Telehealth: Payer: Self-pay

## 2024-11-01 NOTE — Telephone Encounter (Signed)
 Needs app for Bevespi

## 2024-11-04 ENCOUNTER — Ambulatory Visit: Payer: Medicare Other | Admitting: Neurology

## 2024-11-11 ENCOUNTER — Other Ambulatory Visit

## 2024-12-02 ENCOUNTER — Ambulatory Visit: Admitting: Family Medicine

## 2024-12-12 ENCOUNTER — Ambulatory Visit

## 2025-05-22 ENCOUNTER — Ambulatory Visit: Admitting: Neurology
